# Patient Record
Sex: Female | Born: 1935 | Race: White | Hispanic: No | Marital: Married | Smoking: Never smoker
Health system: Southern US, Community
[De-identification: ages and names within clinical notes are randomized; demographics above are authoritative.]

## PROBLEM LIST (undated history)

## (undated) DIAGNOSIS — W19XXXA Unspecified fall, initial encounter: Secondary | ICD-10-CM

## (undated) DIAGNOSIS — I10 Essential (primary) hypertension: Secondary | ICD-10-CM

## (undated) DIAGNOSIS — M1990 Unspecified osteoarthritis, unspecified site: Secondary | ICD-10-CM

## (undated) DIAGNOSIS — F32A Depression, unspecified: Secondary | ICD-10-CM

## (undated) DIAGNOSIS — F329 Major depressive disorder, single episode, unspecified: Secondary | ICD-10-CM

## (undated) HISTORY — DX: Unspecified osteoarthritis, unspecified site: M19.90

## (undated) HISTORY — DX: Depression, unspecified: F32.A

## (undated) HISTORY — PX: TOTAL HIP ARTHROPLASTY: SHX124

## (undated) HISTORY — PX: APPENDECTOMY: SHX54

## (undated) HISTORY — DX: Major depressive disorder, single episode, unspecified: F32.9

## (undated) HISTORY — PX: ABDOMINAL HYSTERECTOMY: SHX81

## (undated) HISTORY — DX: Essential (primary) hypertension: I10

---

## 2004-09-25 ENCOUNTER — Ambulatory Visit: Payer: Self-pay | Admitting: Internal Medicine

## 2005-12-10 ENCOUNTER — Ambulatory Visit: Payer: Self-pay | Admitting: Internal Medicine

## 2006-08-10 ENCOUNTER — Emergency Department: Payer: Self-pay | Admitting: Internal Medicine

## 2006-12-17 ENCOUNTER — Ambulatory Visit: Payer: Self-pay | Admitting: Internal Medicine

## 2008-01-28 ENCOUNTER — Ambulatory Visit: Payer: Self-pay | Admitting: Internal Medicine

## 2009-01-30 ENCOUNTER — Ambulatory Visit: Payer: Self-pay | Admitting: Internal Medicine

## 2009-12-16 ENCOUNTER — Inpatient Hospital Stay: Payer: Self-pay | Admitting: Specialist

## 2009-12-20 ENCOUNTER — Encounter: Payer: Self-pay | Admitting: Internal Medicine

## 2010-01-08 ENCOUNTER — Emergency Department: Payer: Self-pay | Admitting: Internal Medicine

## 2010-01-15 ENCOUNTER — Encounter: Payer: Self-pay | Admitting: Internal Medicine

## 2014-04-04 DIAGNOSIS — I10 Essential (primary) hypertension: Secondary | ICD-10-CM | POA: Insufficient documentation

## 2015-01-02 DIAGNOSIS — E119 Type 2 diabetes mellitus without complications: Secondary | ICD-10-CM | POA: Insufficient documentation

## 2015-01-02 DIAGNOSIS — R7989 Other specified abnormal findings of blood chemistry: Secondary | ICD-10-CM | POA: Insufficient documentation

## 2015-01-27 DIAGNOSIS — M170 Bilateral primary osteoarthritis of knee: Secondary | ICD-10-CM | POA: Insufficient documentation

## 2015-08-28 ENCOUNTER — Ambulatory Visit (INDEPENDENT_AMBULATORY_CARE_PROVIDER_SITE_OTHER): Payer: Medicare Other | Admitting: Family Medicine

## 2015-08-28 ENCOUNTER — Encounter: Payer: Self-pay | Admitting: Family Medicine

## 2015-08-28 VITALS — BP 144/76 | HR 63 | Temp 98.6°F | Resp 16 | Ht 62.0 in | Wt 183.0 lb

## 2015-08-28 DIAGNOSIS — L850 Acquired ichthyosis: Secondary | ICD-10-CM | POA: Diagnosis not present

## 2015-08-28 DIAGNOSIS — M170 Bilateral primary osteoarthritis of knee: Secondary | ICD-10-CM

## 2015-08-28 DIAGNOSIS — E119 Type 2 diabetes mellitus without complications: Secondary | ICD-10-CM

## 2015-08-28 DIAGNOSIS — I10 Essential (primary) hypertension: Secondary | ICD-10-CM

## 2015-08-28 DIAGNOSIS — E559 Vitamin D deficiency, unspecified: Secondary | ICD-10-CM | POA: Diagnosis not present

## 2015-08-28 DIAGNOSIS — I420 Dilated cardiomyopathy: Secondary | ICD-10-CM | POA: Insufficient documentation

## 2015-08-28 DIAGNOSIS — R946 Abnormal results of thyroid function studies: Secondary | ICD-10-CM | POA: Diagnosis not present

## 2015-08-28 DIAGNOSIS — T148XXA Other injury of unspecified body region, initial encounter: Secondary | ICD-10-CM

## 2015-08-28 DIAGNOSIS — I429 Cardiomyopathy, unspecified: Secondary | ICD-10-CM

## 2015-08-28 DIAGNOSIS — F329 Major depressive disorder, single episode, unspecified: Secondary | ICD-10-CM

## 2015-08-28 DIAGNOSIS — F32A Depression, unspecified: Secondary | ICD-10-CM

## 2015-08-28 DIAGNOSIS — T148 Other injury of unspecified body region: Secondary | ICD-10-CM | POA: Diagnosis not present

## 2015-08-28 DIAGNOSIS — L853 Xerosis cutis: Secondary | ICD-10-CM

## 2015-08-28 MED ORDER — DICLOFENAC SODIUM 1 % TD GEL: 2.0000 g | Freq: Four times a day (QID) | TRANSDERMAL | Status: DC

## 2015-08-28 MED ORDER — CAMPHOR-MENTHOL 0.5-0.5 % EX LOTN: 1.0000 "application " | TOPICAL_LOTION | CUTANEOUS | Status: DC | PRN

## 2015-08-28 NOTE — Assessment & Plan Note (Signed)
Encouraged pt to take tramadol only PRN. Trial of diclofenac gel for pain. Continue tylenol 650 TID. Encouraged heat and massage. Consider ortho for joint injections. Consider PT for joint mobilization.

## 2015-08-28 NOTE — Assessment & Plan Note (Signed)
Borderline control. Pt is on multiple medications. Encouraged pt to check at home. Check CMET. Consider combo pills or change to amlodipine for better BP control. Recheck in 1 mos.

## 2015-08-28 NOTE — Progress Notes (Signed)
Subjective:    Patient ID: Carolyn Hernandez, female    DOB: [birth year], 82 y.o.   MRN: [id number]  HPI: Carolyn Hernandez is a 82 y.o. female presenting on 08/28/2015 for Establish Care   HPI  Pt presents to establish care today. Previous care provider was [facility]- Dr. Candiss Norse.  It has been 6 months since Her last PCP visit. Records from previous provider will be requested and reviewed. Current medical problems include:  Hypertension: Diagnosed > 15 years ago. Does not check BP at home. Currently taking nifedipine, metoprolol, lisinopril.  Leg swelling: On lasix for leg swelling. Legs have been swelling for a long time.  Arthritis: Both knees. Currently using a walker after hip fracture. Taking tramadol every morning. Does not feel it's helping. Has not had joint injections. Takes 650 tylenol three times daily.  Depression:  Diagnosis of cardiomyopathy on her chart- cath report and normal echo from [facility]. Has not seen cardiology in quite some time. Sore on her leg: R leg.  Trouble healing. Has itching. Rash around the ankles. Multiple skin tears. She is scratching constantly at night. No change in soaps or detergeants. No animals in the house.  Diabetes: Diet controlled. Diagnosed 20 years ago. Last A1c was 6.0%. Has not had eye exam recently- 2 years most recent. No retinopathy noted.  Depression: Diagnosed when husband died. Feels alone. PHQ-9 20/27  Health maintenance:  Deferred due to wound care.      Past Medical History  Diagnosis Date  . Arthritis   . Depression   . Hypertension    Past Surgical History  Procedure Laterality Date  . Appendectomy    . Abdominal hysterectomy    . Total hip arthroplasty      Social History   Social History  . Marital Status: Married    Spouse Name: N/A  . Number of Children: N/A  . Years of Education: N/A   Occupational History  . Not on file.   Social History Main Topics  . Smoking status: Never Smoker   .  Smokeless tobacco: Not on file  . Alcohol Use: No  . Drug Use: No  . Sexual Activity: Not on file   Other Topics Concern  . Not on file   Social History Narrative  . No narrative on file   Family History  Problem Relation Age of Onset  . Diabetes Daughter   . Heart disease Son   . Liver disease Son   . Alcohol abuse Son    No current outpatient prescriptions on file prior to visit.   No current facility-administered medications on file prior to visit.    Review of Systems  Constitutional: Negative for fever and chills.  HENT: Negative.   Respiratory: Negative for cough, chest tightness and wheezing.   Cardiovascular: Positive for leg swelling. Negative for chest pain.  Gastrointestinal: Negative for nausea, vomiting, abdominal pain, diarrhea and constipation.  Endocrine: Negative.  Negative for cold intolerance, heat intolerance, polydipsia, polyphagia and polyuria.  Genitourinary: Negative for dysuria and difficulty urinating.  Musculoskeletal: Positive for arthralgias. Gait problem: uses walker to ambulate since hip fracture   Neurological: Negative for dizziness, light-headedness and numbness.  Psychiatric/Behavioral: Positive for dysphoric mood. Negative for suicidal ideas and sleep disturbance.   Per HPI unless specifically indicated above     Objective:    BP 144/76 mmHg  Pulse 63  Temp(Src) 98.6 F (37 C) (Oral)  Resp 16  Ht 5\' 2"  (1.575 m)  Wt 183  lb (83.008 kg)  BMI 33.46 kg/m2  Wt Readings from Last 3 Encounters:  08/28/15 183 lb (83.008 kg)    Physical Exam  Constitutional: She is oriented to person, place, and time. She appears well-developed and well-nourished.  HENT:  Head: Normocephalic and atraumatic.  Neck: Neck supple.  Cardiovascular: Normal rate, regular rhythm and normal heart sounds.  Exam reveals no gallop and no friction rub.   No murmur heard. Pulmonary/Chest: Effort normal and breath sounds normal. She has no wheezes. She exhibits no  tenderness.  Abdominal: Soft. Normal appearance and bowel sounds are normal. She exhibits no distension and no mass. There is no tenderness. There is no rebound and no guarding.  Musculoskeletal: She exhibits no tenderness.       Right knee: She exhibits decreased range of motion.       Left knee: She exhibits decreased range of motion. She exhibits no swelling, no effusion and no ecchymosis.       Right lower leg: She exhibits edema (+1 pitting edema).       Left lower leg: She exhibits edema (+1 pitting edema).  ROM limited in knees due to pain. Crepitus with movement R knee.    Lymphadenopathy:    She has no cervical adenopathy.  Neurological: She is alert and oriented to person, place, and time.  Skin: Skin is warm and dry. Rash noted. Rash is macular.      No results found for this or any previous visit.    Assessment & Plan:   Problem List Items Addressed This Visit      Cardiovascular and Mediastinum   Essential (primary) hypertension    Borderline control. Pt is on multiple medications. Encouraged pt to check at home. Check CMET. Consider combo pills or change to amlodipine for better BP control. Recheck in 1 mos.       Relevant Medications   furosemide (LASIX) 20 MG tablet   lisinopril (PRINIVIL,ZESTRIL) 40 MG tablet   metoprolol (LOPRESSOR) 50 MG tablet   NIFEdipine (ADALAT CC) 90 MG 24 hr tablet   aspirin 81 MG tablet   Other Relevant Orders   Comprehensive metabolic panel   Cardiomyopathy [facility])    Per Care Everywhere. No recent EF. Consider echo or cardiology referral given concurrent leg swelling. Check CMET.       Relevant Medications   furosemide (LASIX) 20 MG tablet   lisinopril (PRINIVIL,ZESTRIL) 40 MG tablet   metoprolol (LOPRESSOR) 50 MG tablet   NIFEdipine (ADALAT CC) 90 MG 24 hr tablet   aspirin 81 MG tablet     Endocrine   Controlled type 2 diabetes mellitus without complication (HCC) - Primary    Last A1c 6.0%. Diet controlled. Check HgA1c  today.       Relevant Medications   lisinopril (PRINIVIL,ZESTRIL) 40 MG tablet   aspirin 81 MG tablet   Other Relevant Orders   Lipid panel   Hemoglobin A1c     Other   Clinical depression    Moderate depressive disorder. Check TSH, Vitamin D. Consider increasing citalopram and further visit. Counseling vs. Medication adjustment.       Relevant Medications   citalopram (CELEXA) 10 MG tablet   Other Relevant Orders   VITAMIN D 25 Hydroxy (Vit-D Deficiency, Fractures)   Abnormal results of thyroid function studies    TSH low per careeverywhere. Recheck today. Consider endocrine referral for subclinical hypothyroid.       Relevant Orders   TSH   Primary osteoarthritis of both  knees    Encouraged pt to take tramadol only PRN. Trial of diclofenac gel for pain. Continue tylenol 650 TID. Encouraged heat and massage. Consider ortho for joint injections. Consider PT for joint mobilization.       Relevant Medications   diclofenac sodium (VOLTAREN) 1 % GEL    Other Visit Diagnoses    Vitamin D deficiency        check Vitamin D levels.     Relevant Orders    VITAMIN D 25 Hydroxy (Vit-D Deficiency, Fractures)    Multiple skin tears        Treated with vaseline gause and telfa in the office. Wounds cleaned. Consistent with scratching. Pt encouraged to wear gloves/socks at night not to scratch. Kee    Dry skin dermatitis        Apply Sarna lotion or other moisturizing lotion. Avoid scratching.        Meds ordered this encounter  Medications  . citalopram (CELEXA) 10 MG tablet    Sig: Take 10 mg by mouth daily.  . furosemide (LASIX) 20 MG tablet    Sig: Take 20 mg by mouth daily.  Marland Kitchen levocetirizine (XYZAL) 5 MG tablet    Sig: Take 5 mg by mouth every evening.  Marland Kitchen lisinopril (PRINIVIL,ZESTRIL) 40 MG tablet    Sig: Take 40 mg by mouth daily.  . traMADol (ULTRAM) 50 MG tablet    Sig: Take by mouth every 8 (eight) hours as needed.   . metoprolol (LOPRESSOR) 50 MG tablet    Sig: Take  50 mg by mouth 2 (two) times daily. Pt takes 1 1/2 tab twice daily  . NIFEdipine (ADALAT CC) 90 MG 24 hr tablet    Sig: Take 90 mg by mouth daily.  . potassium chloride SA (K-DUR,KLOR-CON) 20 MEQ tablet    Sig: Take 20 mEq by mouth daily.  Marland Kitchen aspirin 81 MG tablet    Sig: Take 81 mg by mouth daily.  . ferrous sulfate 325 (65 FE) MG tablet    Sig: Take 325 mg by mouth daily with breakfast.  . Cholecalciferol (VITAMIN D) 2000 units tablet    Sig: Take 2,000 Units by mouth daily.  . diclofenac sodium (VOLTAREN) 1 % GEL    Sig: Apply 2 g topically 4 (four) times daily.    Dispense:  100 g    Refill:  11    Order Specific Question:  Supervising Provider    Answer:  Arlis Porta [id number]  . camphor-menthol (SARNA) lotion    Sig: Apply 1 application topically as needed for itching.    Dispense:  222 mL    Refill:  0    Order Specific Question:  Supervising Provider    Answer:  Arlis Porta [id number]      Follow up plan: Return in about 4 weeks (around 09/25/2015) for arthritis. BP.

## 2015-08-28 NOTE — Assessment & Plan Note (Signed)
Last A1c 6.0%. Diet controlled. Check HgA1c today.

## 2015-08-28 NOTE — Patient Instructions (Signed)
Arthritis: Try the voltaren gel up to 4 times daily as needed for pain. I would apply at least twice daily. You can also use capsaicin cream over the counter- this is cream made from jalapeno peppers to help with pain. Apply a small amount twice daily for pain. Use tramadol sparingly. I would try to avoid this medication.  We can refer you to orthopedics for joint injections if needed.   Leg swelling: Get compression socks. Wear them daily when active. Take them off at bedtime. Drink plenty of water and elevate your feet.  We will get some labwork to check in on your kidney and diabetes.   Keep your wounds covers and do not scratch. Get Sarna lotion or gentle moisturizing lotion for dry skin.

## 2015-08-28 NOTE — Assessment & Plan Note (Signed)
Per Care Everywhere. No recent EF. Consider echo or cardiology referral given concurrent leg swelling. Check CMET.

## 2015-08-28 NOTE — Assessment & Plan Note (Signed)
TSH low per careeverywhere. Recheck today. Consider endocrine referral for subclinical hypothyroid.

## 2015-08-28 NOTE — Assessment & Plan Note (Signed)
Moderate depressive disorder. Check TSH, Vitamin D. Consider increasing citalopram and further visit. Counseling vs. Medication adjustment.

## 2015-08-29 ENCOUNTER — Telehealth: Payer: Self-pay | Admitting: Family Medicine

## 2015-08-29 NOTE — Telephone Encounter (Signed)
Proceed. She has failed tylenol, tramadol. She has cardiac history so oral NSAIDs are consider unsafe. Thanks! AK

## 2015-08-29 NOTE — Telephone Encounter (Signed)
Received PA request for Diclofenac. Do you want to proceed or change medication?

## 2015-08-30 NOTE — Telephone Encounter (Signed)
PA pending approval.[place]

## 2015-09-11 DIAGNOSIS — R946 Abnormal results of thyroid function studies: Secondary | ICD-10-CM | POA: Diagnosis not present

## 2015-09-11 DIAGNOSIS — E559 Vitamin D deficiency, unspecified: Secondary | ICD-10-CM | POA: Diagnosis not present

## 2015-09-11 DIAGNOSIS — I10 Essential (primary) hypertension: Secondary | ICD-10-CM | POA: Diagnosis not present

## 2015-09-11 DIAGNOSIS — E119 Type 2 diabetes mellitus without complications: Secondary | ICD-10-CM | POA: Diagnosis not present

## 2015-09-12 ENCOUNTER — Telehealth: Payer: Self-pay | Admitting: Family Medicine

## 2015-09-12 DIAGNOSIS — R7989 Other specified abnormal findings of blood chemistry: Secondary | ICD-10-CM

## 2015-09-12 LAB — LIPID PANEL
CHOLESTEROL TOTAL: 197 mg/dL (ref 100–199)
Chol/HDL Ratio: 4.6 ratio units — ABNORMAL HIGH (ref 0.0–4.4)
HDL: 43 mg/dL (ref >39)
LDL CALC: 121 mg/dL — AB (ref 0–99)
TRIGLYCERIDES: 164 mg/dL — AB (ref 0–149)
VLDL CHOLESTEROL CAL: 33 mg/dL (ref 5–40)

## 2015-09-12 LAB — HEMOGLOBIN A1C
Est. average glucose Bld gHb Est-mCnc: 131 mg/dL
HEMOGLOBIN A1C: 6.2 % — AB (ref 4.8–5.6)

## 2015-09-12 LAB — COMPREHENSIVE METABOLIC PANEL
A/G RATIO: 1.6 (ref 1.2–2.2)
ALT: 19 IU/L (ref 0–32)
AST: 22 IU/L (ref 0–40)
Albumin: 3.9 g/dL (ref 3.5–4.7)
Alkaline Phosphatase: 79 IU/L (ref 39–117)
BUN/Creatinine Ratio: 22 (ref 11–26)
BUN: 21 mg/dL (ref 8–27)
CHLORIDE: 107 mmol/L — AB (ref 96–106)
CO2: 23 mmol/L (ref 18–29)
Calcium: 9.5 mg/dL (ref 8.7–10.3)
Creatinine, Ser: 0.96 mg/dL (ref 0.57–1.00)
GFR calc non Af Amer: 55 mL/min/{1.73_m2} — ABNORMAL LOW (ref >59)
GFR, EST AFRICAN AMERICAN: 63 mL/min/{1.73_m2} (ref >59)
Globulin, Total: 2.5 g/dL (ref 1.5–4.5)
Glucose: 100 mg/dL — ABNORMAL HIGH (ref 65–99)
POTASSIUM: 4.4 mmol/L (ref 3.5–5.2)
SODIUM: 146 mmol/L — AB (ref 134–144)
Total Protein: 6.4 g/dL (ref 6.0–8.5)

## 2015-09-12 LAB — TSH: TSH: 0.018 u[IU]/mL — AB (ref 0.450–4.500)

## 2015-09-12 LAB — VITAMIN D 25 HYDROXY (VIT D DEFICIENCY, FRACTURES): Vit D, 25-Hydroxy: 22.2 ng/mL — ABNORMAL LOW (ref 30.0–100.0)

## 2015-09-12 NOTE — Telephone Encounter (Signed)
Called [facility] to add on labs.

## 2015-09-13 ENCOUNTER — Other Ambulatory Visit: Payer: Self-pay | Admitting: Family Medicine

## 2015-09-13 DIAGNOSIS — E559 Vitamin D deficiency, unspecified: Secondary | ICD-10-CM

## 2015-09-13 MED ORDER — VITAMIN D (ERGOCALCIFEROL) 1.25 MG (50000 UNIT) PO CAPS: 50000.0000 [IU] | ORAL_CAPSULE | ORAL | Status: DC

## 2015-09-14 LAB — SPECIMEN STATUS REPORT

## 2015-09-14 LAB — T4, FREE: Free T4: 0.89 ng/dL (ref 0.82–1.77)

## 2015-09-14 LAB — T3 UPTAKE
Free Thyroxine Index: 1.3 (ref 1.2–4.9)
T3 UPTAKE RATIO: 27 % (ref 24–39)

## 2015-09-14 LAB — T4: T4, Total: 4.9 ug/dL (ref 4.5–12.0)

## 2015-09-14 LAB — T3, FREE: T3, Free: 2.9 pg/mL (ref 2.0–4.4)

## 2015-10-09 ENCOUNTER — Encounter: Payer: Self-pay | Admitting: Family Medicine

## 2015-10-09 ENCOUNTER — Ambulatory Visit (INDEPENDENT_AMBULATORY_CARE_PROVIDER_SITE_OTHER): Payer: Medicare Other | Admitting: Family Medicine

## 2015-10-09 VITALS — BP 137/71 | HR 65 | Temp 97.9°F | Resp 16 | Ht 62.0 in | Wt 185.0 lb

## 2015-10-09 DIAGNOSIS — Z7409 Other reduced mobility: Secondary | ICD-10-CM | POA: Diagnosis not present

## 2015-10-09 DIAGNOSIS — R296 Repeated falls: Secondary | ICD-10-CM | POA: Diagnosis not present

## 2015-10-09 DIAGNOSIS — M170 Bilateral primary osteoarthritis of knee: Secondary | ICD-10-CM

## 2015-10-09 DIAGNOSIS — F329 Major depressive disorder, single episode, unspecified: Secondary | ICD-10-CM

## 2015-10-09 DIAGNOSIS — F32A Depression, unspecified: Secondary | ICD-10-CM

## 2015-10-09 MED ORDER — CITALOPRAM HYDROBROMIDE 20 MG PO TABS: 20.0000 mg | ORAL_TABLET | Freq: Every day | ORAL | Status: DC

## 2015-10-09 NOTE — Assessment & Plan Note (Addendum)
Increase celexa to 20mg  daily. Believe patient would benefit from social interaction- she has refused to join senior center or talk with counselor per her daughters.  Encouraged pt to leave the house more often. Make arrangements with family to have regular outings.  Recheck 1 mos.

## 2015-10-09 NOTE — Patient Instructions (Signed)
Depression: Increase your celexa to 20mg  today. Take 2 pills at bedtime until the bottle is empty and pick up new prescription.  Hopefully this will give you more energy.   We will see if I can get PT out to your house to help with gait training and leg strengthening. Pleae let me know if you would like to see an orthopedist about your knees.

## 2015-10-09 NOTE — Assessment & Plan Note (Signed)
Pt currently declining a referral to orthopedics to be evaluated for joint injections. Continue volataren gel. Have asked for home health Pt to see her to evaluate gait and work on safety with assistive device. Goal to reduce falls and strengthen legs.

## 2015-10-09 NOTE — Progress Notes (Signed)
Subjective:    Patient ID: Carolyn Hernandez, female    DOB: Mar 05, 1930, 82 y.o.   MRN: [id number]  HPI: Carolyn Hernandez is a 82 y.o. female presenting on 10/09/2015 for Hypertension   HPI  Pt presents for hypertension follow-up. BP is controlled today. No chest pain. No shortness of breath. No visual changes.  Arthritis- doing volataren gel for pain. Mild relief. She has difficulty walking and ambulates with a walker. Has never done physical therapy. Has has multiple falls due to difficulty ambulating due to her knees. She lives alone and when she falls cannot get back up. Pt is reluctant to go to orthopedist for joint injections. Pt has been referred to PT in the past but could not go due to vision.  Sleep issues- reports being up and down. No creepy crawly sensation in legs- thinks it is pain keeping her awake. Daughter reports she sleeps a lot during the day. Pt reports she has trouble turning off her thoughts. Depression and anxiety: Feels like her anxiety is worse. Is worrying. Is having complicated grief due to death of husband and son. Pain keeps her home all day- she has difficulty getting out to the house due to knees and poor vision. She sits in the house and thinks all day and worries. She is taking 10mg  celexa- daughter's are thinking she may need a higher dose.     Past Medical History  Diagnosis Date  . Arthritis   . Depression   . Hypertension     Current Outpatient Prescriptions on File Prior to Visit  Medication Sig  . aspirin 81 MG tablet Take 81 mg by mouth daily.  . camphor-menthol (SARNA) lotion Apply 1 application topically as needed for itching.  . diclofenac sodium (VOLTAREN) 1 % GEL Apply 2 g topically 4 (four) times daily.  . furosemide (LASIX) 20 MG tablet Take 20 mg by mouth daily.  Marland Kitchen lisinopril (PRINIVIL,ZESTRIL) 40 MG tablet Take 40 mg by mouth daily.  . metoprolol (LOPRESSOR) 50 MG tablet Take 50 mg by mouth 2 (two) times daily. Pt takes 1 1/2 tab  twice daily  . NIFEdipine (ADALAT CC) 90 MG 24 hr tablet Take 90 mg by mouth daily.  . Vitamin D, Ergocalciferol, (DRISDOL) 50000 units CAPS capsule Take 1 capsule (50,000 Units total) by mouth every 7 (seven) days.  . potassium chloride SA (K-DUR,KLOR-CON) 20 MEQ tablet Take 20 mEq by mouth daily.   No current facility-administered medications on file prior to visit.    Review of Systems  Constitutional: Negative for fever and chills.  HENT: Negative.   Respiratory: Negative for cough, chest tightness and wheezing.   Cardiovascular: Negative for chest pain and leg swelling.  Gastrointestinal: Negative for nausea, vomiting, abdominal pain, diarrhea and constipation.  Endocrine: Negative.  Negative for cold intolerance, heat intolerance, polydipsia, polyphagia and polyuria.  Genitourinary: Negative for dysuria and difficulty urinating.  Musculoskeletal: Positive for joint swelling, arthralgias and gait problem.  Neurological: Negative for dizziness, light-headedness and numbness.  Psychiatric/Behavioral: Positive for sleep disturbance and dysphoric mood. Negative for suicidal ideas. The patient is nervous/anxious.    Per HPI unless specifically indicated above     Objective:    BP 137/71 mmHg  Pulse 65  Temp(Src) 97.9 F (36.6 C) (Oral)  Resp 16  Ht 5\' 2"  (1.575 m)  Wt 185 lb (83.915 kg)  BMI 33.83 kg/m2  Wt Readings from Last 3 Encounters:  10/09/15 185 lb (83.915 kg)  08/28/15 183 lb (83.008  kg)    GAD 7 : Generalized Anxiety Score 10/09/2015  Nervous, Anxious, on Edge 1  Control/stop worrying 0  Worry too much - different things 1  Trouble relaxing 0  Restless 0  Easily annoyed or irritable 1  Afraid - awful might happen 0  Total GAD 7 Score 3  Anxiety Difficulty Not difficult at all    Depression screen [facility] 2/9 10/09/2015 08/28/2015  Decreased Interest 1 3  Down, Depressed, Hopeless 1 2  PHQ - 2 Score 2 5  Altered sleeping 3 3  Tired, decreased energy 3 3    Change in appetite 0 3  Feeling bad or failure about yourself  0 0  Trouble concentrating 0 3  Moving slowly or fidgety/restless 0 0  Suicidal thoughts 0 0  PHQ-9 Score 8 17     Physical Exam  Constitutional: She is oriented to person, place, and time. She appears well-developed and well-nourished.  HENT:  Head: Normocephalic and atraumatic.  Neck: Neck supple.  Cardiovascular: Normal rate, regular rhythm and normal heart sounds.  Exam reveals no gallop and no friction rub.   No murmur heard. Pulmonary/Chest: Effort normal and breath sounds normal. She has no wheezes. She exhibits no tenderness.  Abdominal: Soft. Normal appearance and bowel sounds are normal. She exhibits no distension and no mass. There is no tenderness. There is no rebound and no guarding.  Musculoskeletal: She exhibits no edema.       Right knee: She exhibits decreased range of motion and swelling. Tenderness found. Medial joint line tenderness noted.       Left knee: She exhibits decreased range of motion. Tenderness found. Medial joint line tenderness noted.  Lymphadenopathy:    She has no cervical adenopathy.  Neurological: She is alert and oriented to person, place, and time.  Skin: Skin is warm and dry.  Psychiatric: Her speech is normal and behavior is normal. Judgment and thought content normal. Cognition and memory are normal. She exhibits a depressed mood.   Results for orders placed or performed in visit on 08/28/15  TSH  Result Value Ref Range   TSH 0.018 (L) 0.450 - 4.500 uIU/mL  Comprehensive metabolic panel  Result Value Ref Range   Glucose 100 (H) 65 - 99 mg/dL   BUN 21 8 - 27 mg/dL   Creatinine, Ser 0.96 0.57 - 1.00 mg/dL   GFR calc non Af Amer 55 (L) >59 mL/min/1.73   GFR calc Af Amer 63 >59 mL/min/1.73   BUN/Creatinine Ratio 22 11 - 26   Sodium 146 (H) 134 - 144 mmol/L   Potassium 4.4 3.5 - 5.2 mmol/L   Chloride 107 (H) 96 - 106 mmol/L   CO2 23 18 - 29 mmol/L   Calcium 9.5 8.7 - 10.3  mg/dL   Total Protein 6.4 6.0 - 8.5 g/dL   Albumin 3.9 3.5 - 4.7 g/dL   Globulin, Total 2.5 1.5 - 4.5 g/dL   Albumin/Globulin Ratio 1.6 1.2 - 2.2   Bilirubin Total <0.2 0.0 - 1.2 mg/dL   Alkaline Phosphatase 79 39 - 117 IU/L   AST 22 0 - 40 IU/L   ALT 19 0 - 32 IU/L  VITAMIN D 25 Hydroxy (Vit-D Deficiency, Fractures)  Result Value Ref Range   Vit D, 25-Hydroxy 22.2 (L) 30.0 - 100.0 ng/mL  Lipid panel  Result Value Ref Range   Cholesterol, Total 197 100 - 199 mg/dL   Triglycerides 164 (H) 0 - 149 mg/dL   HDL 43 >39  mg/dL   VLDL Cholesterol Cal 33 5 - 40 mg/dL   LDL Calculated 121 (H) 0 - 99 mg/dL   Chol/HDL Ratio 4.6 (H) 0.0 - 4.4 ratio units  Hemoglobin A1c  Result Value Ref Range   Hgb A1c MFr Bld 6.2 (H) 4.8 - 5.6 %   Est. average glucose Bld gHb Est-mCnc 131 mg/dL  T4, free  Result Value Ref Range   Free T4 0.89 0.82 - 1.77 ng/dL  T4  Result Value Ref Range   T4, Total 4.9 4.5 - 12.0 ug/dL  T3 uptake  Result Value Ref Range   T3 Uptake Ratio 27 24 - 39 %   Free Thyroxine Index 1.3 1.2 - 4.9  T3, free  Result Value Ref Range   T3, Free 2.9 2.0 - 4.4 pg/mL  Specimen status report  Result Value Ref Range   specimen status report Comment       Assessment & Plan:   Problem List Items Addressed This Visit      Other   Clinical depression - Primary    Increase celexa to 20mg  daily. Believe patient would benefit from social interaction- she has refused to join senior center or talk with counselor per her daughters.  Encouraged pt to leave the house more often. Make arrangements with family to have regular outings.  Recheck 1 mos.       Relevant Medications   citalopram (CELEXA) 20 MG tablet   Primary osteoarthritis of both knees    Pt currently declining a referral to orthopedics to be evaluated for joint injections. Continue volataren gel. Have asked for home health Pt to see her to evaluate gait and work on safety with assistive device. Goal to reduce falls and  strengthen legs.       Relevant Orders   Ambulatory referral to [facility]    Other Visit Diagnoses    Recurrent falls        Home health PT consult for safety and to increase mobility.     Relevant Orders    Ambulatory referral to Home Health    Impaired mobility        Due to difficulty walking from osteoarthritis of the knees. Have home health PT evaluate due to patient inability to get out of house for PT.     Relevant Orders    Ambulatory referral to [facility] ordered this encounter  Medications  . citalopram (CELEXA) 20 MG tablet    Sig: Take 1 tablet (20 mg total) by mouth daily.    Dispense:  30 tablet    Refill:  3    Order Specific Question:  Supervising Provider    Answer:  Arlis Porta [id number]      Follow up plan: Return in about 1 month (around 11/08/2015) for depression. Marland Kitchen

## 2015-10-12 ENCOUNTER — Other Ambulatory Visit: Payer: Self-pay | Admitting: Family Medicine

## 2015-10-13 ENCOUNTER — Telehealth: Payer: Self-pay | Admitting: Family Medicine

## 2015-10-13 NOTE — Telephone Encounter (Signed)
Called to confirm pt did not home health PT.

## 2015-10-13 NOTE — Telephone Encounter (Signed)
-----   Message from Devona Konig, [place] sent at 10/13/2015  1:33 PM EDT ----- Regarding: FW: HH PT order    ----- Message -----    From: Leeroy Bock    Sent: 10/13/2015   1:25 PM      To: Devona Konig, CMA Subject: RE: [facility] PT order                                Actually, The patient declined therapy.  Said something about a rod in her leg and didn't want PT>  Just let me know how to proceed when you talk to MD> Thanks! Happy Friday!  ----- Message -----    From: Devona Konig, CMA    Sent: 10/13/2015  10:24 AM      To: Kayren Eaves Ozimek Subject: RE: HH PT order                                Hi I did and we will get Dr. Luan Pulling to [place] but he is not in the office until Monday.  Thanks. ----- Message -----    From: Leeroy Bock    Sent: 10/13/2015   9:55 AM      To: Devona Konig, CMA Subject: HH PT order                                    Good Morning! I just wanted to ensure you got my message about needing an MD to sign the [facility] rx submitted. Thanks! Stanton Kidney

## 2015-10-26 ENCOUNTER — Other Ambulatory Visit: Payer: Self-pay | Admitting: Family Medicine

## 2015-11-06 ENCOUNTER — Ambulatory Visit: Payer: Medicare Other | Admitting: Family Medicine

## 2015-11-20 ENCOUNTER — Ambulatory Visit (INDEPENDENT_AMBULATORY_CARE_PROVIDER_SITE_OTHER): Payer: Medicare Other | Admitting: Family Medicine

## 2015-11-20 VITALS — BP 140/60 | HR 74 | Temp 98.4°F | Resp 16 | Ht 62.0 in | Wt 185.0 lb

## 2015-11-20 DIAGNOSIS — I10 Essential (primary) hypertension: Secondary | ICD-10-CM

## 2015-11-20 DIAGNOSIS — F32A Depression, unspecified: Secondary | ICD-10-CM

## 2015-11-20 DIAGNOSIS — R600 Localized edema: Secondary | ICD-10-CM

## 2015-11-20 DIAGNOSIS — E119 Type 2 diabetes mellitus without complications: Secondary | ICD-10-CM

## 2015-11-20 DIAGNOSIS — F329 Major depressive disorder, single episode, unspecified: Secondary | ICD-10-CM | POA: Diagnosis not present

## 2015-11-20 DIAGNOSIS — M170 Bilateral primary osteoarthritis of knee: Secondary | ICD-10-CM

## 2015-11-20 LAB — POCT UA - MICROALBUMIN
Albumin/Creatinine Ratio, Urine, POC: 0
Creatinine, POC: 0 mg/dL
MICROALBUMIN (UR) POC: 100 mg/L

## 2015-11-20 MED ORDER — FUROSEMIDE 40 MG PO TABS: 40.0000 mg | ORAL_TABLET | Freq: Every day | ORAL | Status: DC

## 2015-11-20 NOTE — Assessment & Plan Note (Signed)
Improved with voltaren gel PRN for knees. Pt continues to decline PT for help with strength and pain management.

## 2015-11-20 NOTE — Patient Instructions (Signed)
Your depression seems to be doing a lot better.   Please schedule an appt to get an eye exam.  Please wear your compression stockings. Take 40mg  of lasix daily. Please elevate your feet when sitting down. IF you noticed redness, calf pain, or other concerning symptoms- please go to the ER.

## 2015-11-20 NOTE — Progress Notes (Addendum)
Subjective:    Patient ID: Carolyn Hernandez, female    DOB: Apr 29, 1930, 82 y.o.   MRN: [id number]  HPI: Carolyn Hernandez is a 82 y.o. female presenting on 11/20/2015 for Depression   HPI  Overall feeling well at home. Sleeping better. She feels over all well. No side effects to increased dose of celexa. No SI or HI.  Knees are still painful but improved with volatren. She does not want PT at this time.  DM: due for follow-up. Last eye exam 2.5 years ago. Previously saw eye doctor but he has since passed away.   Leg swelling: L>R takes lasix. Some improvement. Had hip trauma and many rods/pins. L has always swollen more. Does not wear compression stockings. No redness to the leg. No pain. Occasionally elevates feet.   Past Medical History  Diagnosis Date  . Arthritis   . Depression   . Hypertension     Current Outpatient Prescriptions on File Prior to Visit  Medication Sig  . aspirin 81 MG tablet Take 81 mg by mouth daily.  . camphor-menthol (SARNA) lotion Apply 1 application topically as needed for itching.  . citalopram (CELEXA) 20 MG tablet Take 1 tablet (20 mg total) by mouth daily.  . diclofenac sodium (VOLTAREN) 1 % GEL Apply 2 g topically 4 (four) times daily.  Marland Kitchen lisinopril (PRINIVIL,ZESTRIL) 40 MG tablet TAKE ONE (1) TABLET BY MOUTH EVERY DAY  . metoprolol (LOPRESSOR) 50 MG tablet Take 50 mg by mouth 2 (two) times daily. Pt takes 1 1/2 tab twice daily  . NIFEdipine (ADALAT CC) 90 MG 24 hr tablet Take 90 mg by mouth daily.  . potassium chloride SA (K-DUR,KLOR-CON) 20 MEQ tablet Take 20 mEq by mouth daily.  . Vitamin D, Ergocalciferol, (DRISDOL) 50000 units CAPS capsule Take 1 capsule (50,000 Units total) by mouth every 7 (seven) days.   No current facility-administered medications on file prior to visit.    Review of Systems  Constitutional: Negative for fever and chills.  HENT: Negative.   Respiratory: Negative for cough, chest tightness and wheezing.     Cardiovascular: Positive for leg swelling. Negative for chest pain.  Gastrointestinal: Negative for nausea, vomiting, abdominal pain, diarrhea and constipation.  Endocrine: Negative.  Negative for cold intolerance, heat intolerance, polydipsia, polyphagia and polyuria.  Genitourinary: Negative for dysuria and difficulty urinating.  Musculoskeletal: Negative.   Neurological: Negative for dizziness, light-headedness and numbness.  Psychiatric/Behavioral: Negative.    Per HPI unless specifically indicated above Depression screen [facility] 2/9 11/20/2015 10/09/2015 08/28/2015  Decreased Interest 0 1 3  Down, Depressed, Hopeless 1 1 2   PHQ - 2 Score 1 2 5   Altered sleeping 1 3 3   Tired, decreased energy 3 3 3   Change in appetite - 0 3  Feeling bad or failure about yourself  0 0 0  Trouble concentrating 0 0 3  Moving slowly or fidgety/restless 0 0 0  Suicidal thoughts 0 0 0  PHQ-9 Score 5 8 17         Objective:    BP 140/60 mmHg  Pulse 74  Temp(Src) 98.4 F (36.9 C) (Oral)  Resp 16  Ht 5\' 2"  (1.575 m)  Wt 185 lb (83.915 kg)  BMI 33.83 kg/m2  Wt Readings from Last 3 Encounters:  11/20/15 185 lb (83.915 kg)  10/09/15 185 lb (83.915 kg)  08/28/15 183 lb (83.008 kg)    Physical Exam  Constitutional: She is oriented to person, place, and time. She appears well-developed and well-nourished.  HENT:  Head: Normocephalic and atraumatic.  Neck: Neck supple.  Cardiovascular: Normal rate, regular rhythm and normal heart sounds.  Exam reveals no gallop and no friction rub.   No murmur heard. Pulmonary/Chest: Effort normal and breath sounds normal. She has no wheezes. She exhibits no tenderness.  Abdominal: Soft. Normal appearance and bowel sounds are normal. She exhibits no distension and no mass. There is no tenderness. There is no rebound and no guarding.  Musculoskeletal: Normal range of motion. She exhibits no tenderness.       Right lower leg: She exhibits edema (+1 pitting).       Left  lower leg: She exhibits edema (+1-2 pitting).  Calf circumference L- 44cm/ R 43cm. Negative homan's sign.   Lymphadenopathy:    She has no cervical adenopathy.  Neurological: She is alert and oriented to person, place, and time.  Skin: Skin is warm and dry.   Results for orders placed or performed in visit on 11/20/15  POCT UA - Microalbumin  Result Value Ref Range   Microalbumin Ur, POC 100 mg/L   Creatinine, POC 0 mg/dL   Albumin/Creatinine Ratio, Urine, POC 0       Assessment & Plan:   Problem List Items Addressed This Visit      Cardiovascular and Mediastinum   Essential (primary) hypertension    Controlled. Continue current regimen. Check CMET.       Relevant Medications   furosemide (LASIX) 40 MG tablet     Endocrine   Controlled type 2 diabetes mellitus without complication ([place]) - Primary   Relevant Orders   POCT UA - Microalbumin (Completed)     Other   Clinical depression    Improved with increased celexa. Recheck 1 mos.       Primary osteoarthritis of both knees    Improved with voltaren gel PRN for knees. Pt continues to decline PT for help with strength and pain management.       Pedal edema    L>R. Discussed [place] to r/o blood clot- pt declined. Alarm symptoms reviewed. Increase lasix to 40 mg. Recheck CMET next visit.Encouraged compression stockings.       Relevant Medications   furosemide (LASIX) 40 MG tablet      Meds ordered this encounter  Medications  . DISCONTD: furosemide (LASIX) 40 MG tablet    Sig: Take 1 tablet (40 mg total) by mouth daily.    Dispense:  30 tablet    Refill:  11    Order Specific Question:  Supervising Provider    Answer:  Arlis Porta [phone]  . furosemide (LASIX) 40 MG tablet    Sig: Take 1 tablet (40 mg total) by mouth daily.    Dispense:  90 tablet    Refill:  3    Order Specific Question:  Supervising Provider    Answer:  Arlis Porta [id number]      Follow up plan: Return in about 4 weeks  (around 12/18/2015) for Diabetes. Marland Kitchen

## 2015-11-20 NOTE — Assessment & Plan Note (Signed)
Improved with increased celexa. Recheck 1 mos.

## 2015-11-20 NOTE — Assessment & Plan Note (Signed)
Controlled. Continue current regimen. Check CMET.

## 2015-11-20 NOTE — Assessment & Plan Note (Signed)
L>R. Discussed [place] to r/o blood clot- pt declined. Alarm symptoms reviewed. Increase lasix to 40 mg. Recheck CMET next visit.Encouraged compression stockings.

## 2015-12-18 ENCOUNTER — Other Ambulatory Visit: Payer: Self-pay | Admitting: Family Medicine

## 2015-12-18 ENCOUNTER — Other Ambulatory Visit: Payer: Medicare Other

## 2015-12-18 ENCOUNTER — Other Ambulatory Visit: Payer: Self-pay | Admitting: *Deleted

## 2015-12-18 DIAGNOSIS — R7989 Other specified abnormal findings of blood chemistry: Secondary | ICD-10-CM

## 2015-12-18 DIAGNOSIS — R946 Abnormal results of thyroid function studies: Secondary | ICD-10-CM | POA: Diagnosis not present

## 2015-12-19 LAB — T3: T3 TOTAL: 113 ng/dL (ref 76–181)

## 2015-12-19 LAB — T3 UPTAKE: T3 UPTAKE: 32 % (ref 22–35)

## 2015-12-19 LAB — T4: T4, Total: 6.5 ug/dL (ref 4.5–12.0)

## 2015-12-19 LAB — T4, FREE: Free T4: 1.1 ng/dL (ref 0.8–1.8)

## 2015-12-20 LAB — TSH: TSH: 0.03 m[IU]/L — AB

## 2015-12-25 ENCOUNTER — Ambulatory Visit (INDEPENDENT_AMBULATORY_CARE_PROVIDER_SITE_OTHER): Payer: Medicare Other | Admitting: Family Medicine

## 2015-12-25 VITALS — BP 140/78 | HR 57 | Temp 98.1°F | Resp 16 | Ht 62.0 in | Wt 177.8 lb

## 2015-12-25 DIAGNOSIS — E119 Type 2 diabetes mellitus without complications: Secondary | ICD-10-CM | POA: Diagnosis not present

## 2015-12-25 DIAGNOSIS — I429 Cardiomyopathy, unspecified: Secondary | ICD-10-CM | POA: Diagnosis not present

## 2015-12-25 DIAGNOSIS — I10 Essential (primary) hypertension: Secondary | ICD-10-CM

## 2015-12-25 LAB — POCT GLYCOSYLATED HEMOGLOBIN (HGB A1C): HEMOGLOBIN A1C: 5.8

## 2015-12-25 MED ORDER — METOPROLOL TARTRATE 50 MG PO TABS: 50.0000 mg | ORAL_TABLET | Freq: Two times a day (BID) | ORAL | Status: DC

## 2015-12-25 NOTE — Assessment & Plan Note (Signed)
Elevated but recheck is below 150/90 goal. HR is low- will reduce dose of Metoprolol- recheck 4 weeks.

## 2015-12-25 NOTE — Patient Instructions (Addendum)
Keep up the good work with your diabetes!  Please remember to schedule and eye exam. Please let us know if you would like a referral to [facility].    Please only take metoprolol 1 tablet twice daily. Your heart rate is a little low.  Your goal blood pressure is 150/90. Work on low salt/sodium diet - goal <1.5gm (1,500mg ) per day. Eat a diet high in fruits/vegetables and whole grains.  Look into mediterranean and DASH diet. Goal activity is 163min/wk of moderate intensity exercise.  This can be split into 30 minute chunks.  If you are not at this level, you can start with smaller 10-15 min increments and slowly build up activity. Look at [place].org for more resources

## 2015-12-25 NOTE — Progress Notes (Signed)
Subjective:    Patient ID: Carolyn Hernandez, female    DOB: 1930/06/14, 82 y.o.   MRN: [id number]  HPI: [name] is a 82 y.o. female presenting on 12/25/2015 for Diabetes   HPI  Pt presents for diabetes and hypertension follow-up.  Diabetes: A1c is 5.8% today. Pt is diet controlled. No numbness or tingling in her feet. Has not had eye exam this year.  Hypertension: No HA, SOB, chest pain. Has taken all her medication but metoprolol. Does feel dizzy occasionally with low HR.  Past Medical History  Diagnosis Date  . Arthritis   . Depression   . Hypertension     Current Outpatient Prescriptions on File Prior to Visit  Medication Sig  . aspirin 81 MG tablet Take 81 mg by mouth daily.  . camphor-menthol (SARNA) lotion Apply 1 application topically as needed for itching.  . citalopram (CELEXA) 20 MG tablet Take 1 tablet (20 mg total) by mouth daily.  . diclofenac sodium (VOLTAREN) 1 % GEL Apply 2 g topically 4 (four) times daily.  . furosemide (LASIX) 40 MG tablet Take 1 tablet (40 mg total) by mouth daily.  Marland Kitchen lisinopril (PRINIVIL,ZESTRIL) 40 MG tablet TAKE ONE (1) TABLET BY MOUTH EVERY DAY  . NIFEdipine (ADALAT CC) 90 MG 24 hr tablet Take 90 mg by mouth daily.  . potassium chloride SA (K-DUR,KLOR-CON) 20 MEQ tablet Take 20 mEq by mouth daily.  . Vitamin D, Ergocalciferol, (DRISDOL) 50000 units CAPS capsule Take 1 capsule (50,000 Units total) by mouth every 7 (seven) days.   No current facility-administered medications on file prior to visit.    Review of Systems  Constitutional: Negative for fever and chills.  HENT: Negative.   Respiratory: Negative for cough, chest tightness and wheezing.   Cardiovascular: Negative for chest pain and leg swelling.  Gastrointestinal: Negative for nausea, vomiting, abdominal pain, diarrhea and constipation.  Endocrine: Negative.  Negative for cold intolerance, heat intolerance, polydipsia, polyphagia and polyuria.  Genitourinary:  Negative for dysuria and difficulty urinating.  Musculoskeletal: Negative.   Neurological: Negative for dizziness, light-headedness and numbness.  Psychiatric/Behavioral: Negative.    Per HPI unless specifically indicated above     Objective:    BP 140/78 mmHg  Pulse 57  Temp(Src) 98.1 F (36.7 C) (Oral)  Resp 16  Ht 5\' 2"  (1.575 m)  Wt 177 lb 12.8 oz (80.65 kg)  BMI 32.51 kg/m2  Wt Readings from Last 3 Encounters:  12/25/15 177 lb 12.8 oz (80.65 kg)  11/20/15 185 lb (83.915 kg)  10/09/15 185 lb (83.915 kg)    Physical Exam  Constitutional: She is oriented to person, place, and time. She appears well-developed and well-nourished.  HENT:  Head: Normocephalic and atraumatic.  Neck: Neck supple.  Cardiovascular: Regular rhythm, normal heart sounds and normal pulses.  Bradycardia present.  PMI is not displaced.  Exam reveals no gallop and no friction rub.   No murmur heard. Pulmonary/Chest: Effort normal and breath sounds normal. She has no wheezes. She exhibits no tenderness.  Abdominal: Soft. Normal appearance and bowel sounds are normal. She exhibits no distension and no mass. There is no tenderness. There is no rebound and no guarding.  Musculoskeletal: Normal range of motion. She exhibits no tenderness.       Right lower leg: She exhibits edema (+1).       Left lower leg: She exhibits edema (+1).  Lymphadenopathy:    She has no cervical adenopathy.  Neurological: She is alert and oriented  to person, place, and time.  Skin: Skin is warm and dry.   Results for orders placed or performed in visit on 12/25/15  POCT HgB A1C  Result Value Ref Range   Hemoglobin A1C 5.8       Assessment & Plan:   Problem List Items Addressed This Visit      Cardiovascular and Mediastinum   Essential (primary) hypertension    Elevated but recheck is below 150/90 goal. HR is low- will reduce dose of Metoprolol- recheck 4 weeks.       Relevant Medications   metoprolol (LOPRESSOR) 50 MG  tablet   Cardiomyopathy (HCC)   Relevant Medications   metoprolol (LOPRESSOR) 50 MG tablet     Endocrine   Controlled type 2 diabetes mellitus without complication ([place]) - Primary    Controlled. Recheck a1c 6 mos. Reminded pt to get eye exam.       Relevant Orders   POCT HgB A1C (Completed)      Meds ordered this encounter  Medications  . metoprolol (LOPRESSOR) 50 MG tablet    Sig: Take 1 tablet (50 mg total) by mouth 2 (two) times daily.    Dispense:  60 tablet    Refill:  11    Order Specific Question:  Supervising Provider    Answer:  Arlis Porta [phone]      Follow up plan: Return in about 4 weeks (around 01/22/2016) for BP check. Marland Kitchen

## 2015-12-25 NOTE — Assessment & Plan Note (Signed)
Controlled. Recheck a1c 6 mos. Reminded pt to get eye exam.

## 2016-01-29 ENCOUNTER — Ambulatory Visit (INDEPENDENT_AMBULATORY_CARE_PROVIDER_SITE_OTHER): Payer: Medicare Other | Admitting: Family Medicine

## 2016-01-29 VITALS — BP 154/76 | HR 70 | Temp 98.3°F | Resp 16 | Ht 62.0 in | Wt 170.0 lb

## 2016-01-29 DIAGNOSIS — E119 Type 2 diabetes mellitus without complications: Secondary | ICD-10-CM | POA: Diagnosis not present

## 2016-01-29 DIAGNOSIS — I10 Essential (primary) hypertension: Secondary | ICD-10-CM

## 2016-01-29 DIAGNOSIS — R946 Abnormal results of thyroid function studies: Secondary | ICD-10-CM | POA: Diagnosis not present

## 2016-01-29 LAB — BASIC METABOLIC PANEL WITH GFR
BUN: 27 mg/dL — ABNORMAL HIGH (ref 7–25)
CHLORIDE: 105 mmol/L (ref 98–110)
CO2: 23 mmol/L (ref 20–31)
Calcium: 9.6 mg/dL (ref 8.6–10.4)
Creat: 1.1 mg/dL — ABNORMAL HIGH (ref 0.60–0.88)
GFR, EST NON AFRICAN AMERICAN: 46 mL/min — AB (ref >=60)
GFR, Est African American: 53 mL/min — ABNORMAL LOW (ref >=60)
Glucose, Bld: 93 mg/dL (ref 65–99)
POTASSIUM: 4.2 mmol/L (ref 3.5–5.3)
SODIUM: 140 mmol/L (ref 135–146)

## 2016-01-29 MED ORDER — METOPROLOL SUCCINATE ER 100 MG PO TB24
100.0000 mg | ORAL_TABLET | Freq: Every day | ORAL | 3 refills | Status: DC
Start: 2016-01-29 — End: 2016-04-01

## 2016-01-29 NOTE — Assessment & Plan Note (Signed)
Pt is still feeling tired but has opted not to got to Endocrinology to treat her subclinical hyperthyroidism.

## 2016-01-29 NOTE — Assessment & Plan Note (Signed)
Referral for eye exam placed today.

## 2016-01-29 NOTE — Assessment & Plan Note (Addendum)
Elevated but HR is WNL. Will try toprol XL to control BP. Pt will monitor BP closely at home. Recheck BMP to check on kidney function today. Recheck 2 mos.

## 2016-01-29 NOTE — Progress Notes (Signed)
Subjective:    Patient ID: Carolyn Hernandez, female    DOB: December 08, 1929, 82 y.o.   MRN: [id number]  HPI: Carolyn Hernandez is a 82 y.o. female presenting on 01/29/2016 for Diabetes   HPI  Pt presents for recheck of blood pressure. Metoprolol was reduced last visit due to low HR. Pt is not visit. Feels a little tired.  Has not had diabetic eye exam.   Past Medical History:  Diagnosis Date  . Arthritis   . Depression   . Hypertension     Current Outpatient Prescriptions on File Prior to Visit  Medication Sig  . aspirin 81 MG tablet Take 81 mg by mouth daily.  . camphor-menthol (SARNA) lotion Apply 1 application topically as needed for itching.  . citalopram (CELEXA) 20 MG tablet Take 1 tablet (20 mg total) by mouth daily.  . diclofenac sodium (VOLTAREN) 1 % GEL Apply 2 g topically 4 (four) times daily.  . furosemide (LASIX) 40 MG tablet Take 1 tablet (40 mg total) by mouth daily.  Marland Kitchen lisinopril (PRINIVIL,ZESTRIL) 40 MG tablet TAKE ONE (1) TABLET BY MOUTH EVERY DAY  . NIFEdipine (ADALAT CC) 90 MG 24 hr tablet Take 90 mg by mouth daily.  . potassium chloride SA (K-DUR,KLOR-CON) 20 MEQ tablet Take 20 mEq by mouth daily.  . Vitamin D, Ergocalciferol, (DRISDOL) 50000 units CAPS capsule Take 1 capsule (50,000 Units total) by mouth every 7 (seven) days.   No current facility-administered medications on file prior to visit.     Review of Systems  Constitutional: Negative for chills and fever.  HENT: Negative.   Respiratory: Negative for cough, chest tightness and wheezing.   Cardiovascular: Negative for chest pain and leg swelling.  Gastrointestinal: Negative for abdominal pain, constipation, diarrhea, nausea and vomiting.  Endocrine: Negative.  Negative for cold intolerance, heat intolerance, polydipsia, polyphagia and polyuria.  Genitourinary: Negative for difficulty urinating and dysuria.  Musculoskeletal: Negative.   Neurological: Negative for dizziness, light-headedness  and numbness.  Psychiatric/Behavioral: Negative.    Per HPI unless specifically indicated above     Objective:    BP (!) 154/76 (BP Location: Left Arm)   Pulse 70   Temp 98.3 F (36.8 C) (Oral)   Resp 16   Ht 5\' 2"  (1.575 m)   Wt 170 lb (77.1 kg)   BMI 31.09 kg/m   Wt Readings from Last 3 Encounters:  01/29/16 170 lb (77.1 kg)  12/25/15 177 lb 12.8 oz (80.6 kg)  11/20/15 185 lb (83.9 kg)    Physical Exam  Constitutional: She is oriented to person, place, and time. She appears well-developed and well-nourished.  HENT:  Head: Normocephalic and atraumatic.  Neck: Neck supple.  Cardiovascular: Normal rate, regular rhythm and normal heart sounds.  Exam reveals no gallop and no friction rub.   No murmur heard. Pulmonary/Chest: Effort normal and breath sounds normal. She has no wheezes. She exhibits no tenderness.  Abdominal: Soft. Normal appearance and bowel sounds are normal. She exhibits no distension and no mass. There is no tenderness. There is no rebound and no guarding.  Musculoskeletal: Normal range of motion. She exhibits edema. She exhibits no tenderness.  Lymphadenopathy:    She has no cervical adenopathy.  Neurological: She is alert and oriented to person, place, and time.  Skin: Skin is warm and dry.   Results for orders placed or performed in visit on 12/25/15  POCT HgB A1C  Result Value Ref Range   Hemoglobin A1C 5.8  Assessment & Plan:   Problem List Items Addressed This Visit      Cardiovascular and Mediastinum   Essential (primary) hypertension - Primary    Elevated but HR is WNL. Will try toprol XL to control BP. Pt will monitor BP closely at home. Recheck BMP to check on kidney function today. Recheck 2 mos.       Relevant Medications   metoprolol succinate (TOPROL-XL) 100 MG 24 hr tablet   Other Relevant Orders   BASIC METABOLIC PANEL WITH GFR     Endocrine   Controlled type 2 diabetes mellitus without complication ([place])    Referral for  eye exam placed today.       Relevant Orders   Ambulatory referral to Ophthalmology     Other   Abnormal results of thyroid function studies    Pt is still feeling tired but has opted not to got to Endocrinology to treat her subclinical hyperthyroidism.        Other Visit Diagnoses   None.     Meds ordered this encounter  Medications  . metoprolol succinate (TOPROL-XL) 100 MG 24 hr tablet    Sig: Take 1 tablet (100 mg total) by mouth daily. Take with or immediately following a meal.    Dispense:  90 tablet    Refill:  3    Order Specific Question:   Supervising Provider    Answer:   Arlis Porta [id number]      Follow up plan: Return in about 2 months (around 03/30/2016), or if symptoms worsen or fail to improve.

## 2016-01-29 NOTE — Patient Instructions (Signed)
Please seek immediate medical attention at ER or Urgent Care if you develop: Chest pain, pressure or tightness. Shortness of breath accompanied by nausea or diaphoresis Visual changes Numbness or tingling on one side of the body Facial droop Altered mental status Or any concerning symptoms.  Your goal blood pressure is  140/90 or 150/90 Please check your blood pressure cuff.  Work on low salt/sodium diet - goal <1.5gm (1,500mg ) per day. Eat a diet high in fruits/vegetables and whole grains.  Look into mediterranean and DASH diet. Goal activity is 11min/wk of moderate intensity exercise.  This can be split into 30 minute chunks.  If you are not at this level, you can start with smaller 10-15 min increments and slowly build up activity. Look at [place].org for more resources

## 2016-02-07 ENCOUNTER — Telehealth: Payer: Self-pay | Admitting: Family Medicine

## 2016-02-07 NOTE — Telephone Encounter (Signed)
After calling to inquire about appt to [facility]. Referral was refaxed b/c the said they didn't receive. Now noted that it was scheduled and patient cancelled. It is noted that patient prefers Monday appt and contact info on referral.

## 2016-02-07 NOTE — Telephone Encounter (Signed)
Pt was referred to [facility] and an appt was made for Aug 18th.  Pt later called to cancel.

## 2016-04-01 ENCOUNTER — Encounter: Payer: Self-pay | Admitting: Family Medicine

## 2016-04-01 ENCOUNTER — Ambulatory Visit (INDEPENDENT_AMBULATORY_CARE_PROVIDER_SITE_OTHER): Payer: Medicare Other | Admitting: Family Medicine

## 2016-04-01 VITALS — BP 139/69 | HR 59 | Temp 98.1°F | Resp 16 | Ht 62.0 in | Wt 172.6 lb

## 2016-04-01 DIAGNOSIS — F321 Major depressive disorder, single episode, moderate: Secondary | ICD-10-CM

## 2016-04-01 DIAGNOSIS — I10 Essential (primary) hypertension: Secondary | ICD-10-CM | POA: Diagnosis not present

## 2016-04-01 DIAGNOSIS — R946 Abnormal results of thyroid function studies: Secondary | ICD-10-CM | POA: Diagnosis not present

## 2016-04-01 DIAGNOSIS — E119 Type 2 diabetes mellitus without complications: Secondary | ICD-10-CM

## 2016-04-01 LAB — T4, FREE: FREE T4: 1.1 ng/dL (ref 0.8–1.8)

## 2016-04-01 LAB — TSH: TSH: 0.01 mIU/L — ABNORMAL LOW

## 2016-04-01 LAB — T3, FREE: T3 FREE: 3.6 pg/mL (ref 2.3–4.2)

## 2016-04-01 LAB — POCT GLYCOSYLATED HEMOGLOBIN (HGB A1C): HEMOGLOBIN A1C: 5.8

## 2016-04-01 MED ORDER — CITALOPRAM HYDROBROMIDE 20 MG PO TABS: 20.0000 mg | ORAL_TABLET | Freq: Every day | ORAL | 3 refills | Status: DC

## 2016-04-01 MED ORDER — LISINOPRIL 40 MG PO TABS: 40.0000 mg | ORAL_TABLET | Freq: Every day | ORAL | 3 refills | Status: DC

## 2016-04-01 MED ORDER — METOPROLOL TARTRATE 50 MG PO TABS: 50.0000 mg | ORAL_TABLET | Freq: Two times a day (BID) | ORAL | 3 refills | Status: DC

## 2016-04-01 MED ORDER — NIFEDIPINE ER 90 MG PO TB24: 90.0000 mg | ORAL_TABLET | Freq: Every day | ORAL | 3 refills | Status: DC

## 2016-04-01 NOTE — Assessment & Plan Note (Signed)
Diabetes well controlled with diet and lifestyle changes. No medications at this time. Will continue to monitor. Diabetic eye exam is UTD. Will need repeat UA microalbumin in January. Foot exam UTD.  ACE for renal protection.

## 2016-04-01 NOTE — Assessment & Plan Note (Signed)
Doing well on celexa. Discussed need to be involved in activities and leave the house to help with mood and anxiety. Pt waiting to get a place at a senior living apartment where they will have activities- family hopes this will help depression.

## 2016-04-01 NOTE — Progress Notes (Signed)
Subjective:    Patient ID: Carolyn Hernandez, female    DOB: 1929/08/11, 82 y.o.   MRN: [id number]  HPI: Carolyn Hernandez is a 82 y.o. female presenting on 04/01/2016 for Diabetes   HPI  Pt presents for diabetes follow-up today. A1c is stable at 5.8%.  She trialed Toprol XL for BP as opposed to metoprolol twice daily. She did not like the way she felt on the metoprolol. She has gone back to taking metoprolol 50mg  twice daily. Doing well. No chest pain. No shortness of breath. No dizziness. No visual changes.  Pt has had subclinical hyperthyroidism. Previously declined imaging or referral to endocrinology.  Pt declines flu shot today.  Bruising on her R hand- hit on table. Pt reports not painful.  Her depression is doing well. Taking 20mg  of celexa daily.  She reports she does not leaver her house much. Sits at home most days which causes her to "sit and think." Sometimes her fears and deaths in her family get on her mind.  Family is trying to make her participate in more activities to help with her mood.  Past Medical History:  Diagnosis Date  . Arthritis   . Depression   . Hypertension     Current Outpatient Prescriptions on File Prior to Visit  Medication Sig  . aspirin 81 MG tablet Take 81 mg by mouth daily.  . camphor-menthol (SARNA) lotion Apply 1 application topically as needed for itching.  . diclofenac sodium (VOLTAREN) 1 % GEL Apply 2 g topically 4 (four) times daily.  . furosemide (LASIX) 40 MG tablet Take 1 tablet (40 mg total) by mouth daily.  . potassium chloride SA (K-DUR,KLOR-CON) 20 MEQ tablet Take 20 mEq by mouth daily.  . Vitamin D, Ergocalciferol, (DRISDOL) 50000 units CAPS capsule Take 1 capsule (50,000 Units total) by mouth every 7 (seven) days.   No current facility-administered medications on file prior to visit.     Review of Systems  Constitutional: Negative for chills and fever.  HENT: Negative.   Respiratory: Negative for cough, chest tightness  and wheezing.   Cardiovascular: Negative for chest pain and leg swelling.  Gastrointestinal: Negative for abdominal pain, constipation, diarrhea, nausea and vomiting.  Endocrine: Negative.  Negative for cold intolerance, heat intolerance, polydipsia, polyphagia and polyuria.  Genitourinary: Negative for difficulty urinating and dysuria.  Musculoskeletal: Positive for joint swelling.  Neurological: Negative for dizziness, light-headedness and numbness.  Psychiatric/Behavioral: Negative.    Per HPI unless specifically indicated above Depression screen [facility] 2/9 04/01/2016 11/20/2015 10/09/2015 08/28/2015  Decreased Interest 1 0 1 3  Down, Depressed, Hopeless 1 1 1 2   PHQ - 2 Score 2 1 2 5   Altered sleeping 0 1 3 3   Tired, decreased energy 1 3 3 3   Change in appetite 0 - 0 3  Feeling bad or failure about yourself  0 0 0 0  Trouble concentrating 0 0 0 3  Moving slowly or fidgety/restless 1 0 0 0  Suicidal thoughts 0 0 0 0  PHQ-9 Score 4 5 8 17   Difficult doing work/chores Not difficult at all - - -       Objective:    BP 139/69   Pulse (!) 59   Temp 98.1 F (36.7 C) (Oral)   Resp 16   Ht 5\' 2"  (1.575 m)   Wt 172 lb 9.6 oz (78.3 kg)   BMI 31.57 kg/m   Wt Readings from Last 3 Encounters:  04/01/16 172 lb 9.6 oz (  78.3 kg)  01/29/16 170 lb (77.1 kg)  12/25/15 177 lb 12.8 oz (80.6 kg)    Physical Exam  Constitutional: She is oriented to person, place, and time. She appears well-developed and well-nourished.  HENT:  Head: Normocephalic and atraumatic.  Neck: Neck supple. No thyromegaly present.  Cardiovascular: Normal rate, regular rhythm and normal heart sounds.  Exam reveals no gallop and no friction rub.   No murmur heard. Pulmonary/Chest: Effort normal and breath sounds normal. She has no wheezes. She exhibits no tenderness.  Abdominal: Soft. Normal appearance and bowel sounds are normal. She exhibits no distension and no mass. There is no tenderness. There is no rebound and no  guarding.  Musculoskeletal: Normal range of motion. She exhibits no edema or tenderness.       Right hand: She exhibits swelling (echymosis along the 2nd and 3rd MCP. Non-tender.). She exhibits normal range of motion, no tenderness, normal capillary refill and no deformity.  Lymphadenopathy:    She has no cervical adenopathy.  Neurological: She is alert and oriented to person, place, and time.  Skin: Skin is warm and dry.     Diabetic Foot Exam - Simple   Simple Foot Form Diabetic Foot exam was performed with the following findings:  Yes 04/01/2016  3:33 PM  Visual Inspection No deformities, no ulcerations, no other skin breakdown bilaterally:  Yes Sensation Testing Intact to touch and monofilament testing bilaterally:  Yes Pulse Check Posterior Tibialis and Dorsalis pulse intact bilaterally:  Yes Comments     Results for orders placed or performed in visit on 04/01/16  POCT HgB A1C  Result Value Ref Range   Hemoglobin A1C 5.8       Assessment & Plan:   Problem List Items Addressed This Visit      Cardiovascular and Mediastinum   Essential (primary) hypertension   Relevant Medications   metoprolol (LOPRESSOR) 50 MG tablet   NIFEdipine (ADALAT CC) 90 MG 24 hr tablet   lisinopril (PRINIVIL,ZESTRIL) 40 MG tablet   Other Relevant Orders   BASIC METABOLIC PANEL WITH GFR     Endocrine   Controlled type 2 diabetes mellitus without complication ([place]) - Primary    Diabetes well controlled with diet and lifestyle changes. No medications at this time. Will continue to monitor. Diabetic eye exam is UTD. Will need repeat UA microalbumin in January. Foot exam UTD.  ACE for renal protection.       Relevant Medications   lisinopril (PRINIVIL,ZESTRIL) 40 MG tablet   Other Relevant Orders   POCT HgB A1C (Completed)   BASIC METABOLIC PANEL WITH GFR     Other   Clinical depression    Doing well on celexa. Discussed need to be involved in activities and leave the house to help with  mood and anxiety. Pt waiting to get a place at a senior living apartment where they will have activities- family hopes this will help depression.       Relevant Medications   citalopram (CELEXA) 20 MG tablet   Abnormal results of thyroid function studies    TSH has been low with normal T3/T4- pt has declined imaging or endocrinology referral in the past. Will recheck today. Pt is agreeable to ultrasound with abnormal TSH.      Relevant Orders   TSH   T3, free   T4, free   T3 Uptake    Other Visit Diagnoses   None.     Meds ordered this encounter  Medications  . metoprolol (  LOPRESSOR) 50 MG tablet    Sig: Take 1 tablet (50 mg total) by mouth 2 (two) times daily.    Dispense:  180 tablet    Refill:  3    Order Specific Question:   Supervising Provider    Answer:   Arlis Porta [phone]  . NIFEdipine (ADALAT CC) 90 MG 24 hr tablet    Sig: Take 1 tablet (90 mg total) by mouth daily.    Dispense:  90 tablet    Refill:  3    Order Specific Question:   Supervising Provider    Answer:   Arlis Porta [phone]  . citalopram (CELEXA) 20 MG tablet    Sig: Take 1 tablet (20 mg total) by mouth daily.    Dispense:  90 tablet    Refill:  3    Order Specific Question:   Supervising Provider    Answer:   Arlis Porta [phone]  . lisinopril (PRINIVIL,ZESTRIL) 40 MG tablet    Sig: Take 1 tablet (40 mg total) by mouth daily.    Dispense:  90 tablet    Refill:  3    COULD PATIENT HAVE NEW RX FOR 90 DAY SUPPLY? THANKS!    Order Specific Question:   Supervising Provider    Answer:   Arlis Porta [[id number]]      Follow up plan: Return in about 6 months (around 09/30/2016), or if symptoms worsen or fail to improve, for Diabetes. Marland Kitchen

## 2016-04-01 NOTE — Patient Instructions (Addendum)
Keep up the good work with your diabetes. We will check your thyroid function today- if your hormones are still abnormal- we may get an ultrasound of your thyroid.  Your goal blood pressure is 140/90. Work on low salt/sodium diet - goal <1.5gm (1,500mg ) per day. Eat a diet high in fruits/vegetables and whole grains.  Look into mediterranean and DASH diet. Goal activity is 169min/wk of moderate intensity exercise.  This can be split into 30 minute chunks.  If you are not at this level, you can start with smaller 10-15 min increments and slowly build up activity. Look at [email or web address].[email or web address] for more resources  Please seek immediate medical attention at ER or Urgent Care if you develop: Chest pain, pressure or tightness. Shortness of breath accompanied by nausea or diaphoresis Visual changes Numbness or tingling on one side of the body Facial droop Altered mental status Or any concerning symptoms.

## 2016-04-01 NOTE — Assessment & Plan Note (Signed)
TSH has been low with normal T3/T4- pt has declined imaging or endocrinology referral in the past. Will recheck today. Pt is agreeable to ultrasound with abnormal TSH.

## 2016-04-02 LAB — BASIC METABOLIC PANEL WITH GFR
BUN: 23 mg/dL (ref 7–25)
CALCIUM: 9.4 mg/dL (ref 8.6–10.4)
CO2: 22 mmol/L (ref 20–31)
Chloride: 106 mmol/L (ref 98–110)
Creat: 1 mg/dL — ABNORMAL HIGH (ref 0.60–0.88)
GFR, EST NON AFRICAN AMERICAN: 52 mL/min — AB (ref >=60)
GFR, Est African American: 60 mL/min (ref >=60)
GLUCOSE: 128 mg/dL — AB (ref 65–99)
Potassium: 4.1 mmol/L (ref 3.5–5.3)
Sodium: 141 mmol/L (ref 135–146)

## 2016-04-02 LAB — T3 UPTAKE: T3 Uptake: 30 % (ref 22–35)

## 2016-08-30 ENCOUNTER — Encounter: Payer: Self-pay | Admitting: *Deleted

## 2016-09-02 ENCOUNTER — Ambulatory Visit: Payer: Medicare Other | Admitting: Family Medicine

## 2016-09-02 ENCOUNTER — Ambulatory Visit (INDEPENDENT_AMBULATORY_CARE_PROVIDER_SITE_OTHER): Payer: Medicare Other | Admitting: Nurse Practitioner

## 2016-09-02 VITALS — BP 140/70 | HR 71 | Temp 98.0°F | Resp 16 | Ht 62.0 in | Wt 175.0 lb

## 2016-09-02 DIAGNOSIS — F321 Major depressive disorder, single episode, moderate: Secondary | ICD-10-CM | POA: Diagnosis not present

## 2016-09-02 DIAGNOSIS — F329 Major depressive disorder, single episode, unspecified: Secondary | ICD-10-CM

## 2016-09-02 DIAGNOSIS — R011 Cardiac murmur, unspecified: Secondary | ICD-10-CM | POA: Diagnosis not present

## 2016-09-02 DIAGNOSIS — E119 Type 2 diabetes mellitus without complications: Secondary | ICD-10-CM | POA: Diagnosis not present

## 2016-09-02 DIAGNOSIS — R600 Localized edema: Secondary | ICD-10-CM

## 2016-09-02 DIAGNOSIS — E559 Vitamin D deficiency, unspecified: Secondary | ICD-10-CM

## 2016-09-02 DIAGNOSIS — R7989 Other specified abnormal findings of blood chemistry: Secondary | ICD-10-CM

## 2016-09-02 DIAGNOSIS — I10 Essential (primary) hypertension: Secondary | ICD-10-CM

## 2016-09-02 DIAGNOSIS — F32A Depression, unspecified: Secondary | ICD-10-CM

## 2016-09-02 DIAGNOSIS — R946 Abnormal results of thyroid function studies: Secondary | ICD-10-CM | POA: Diagnosis not present

## 2016-09-02 DIAGNOSIS — I499 Cardiac arrhythmia, unspecified: Secondary | ICD-10-CM | POA: Diagnosis not present

## 2016-09-02 LAB — POCT GLYCOSYLATED HEMOGLOBIN (HGB A1C): Hemoglobin A1C: 5.7

## 2016-09-02 MED ORDER — FUROSEMIDE 40 MG PO TABS: 40.0000 mg | ORAL_TABLET | Freq: Every day | ORAL | 3 refills | Status: DC

## 2016-09-02 MED ORDER — LISINOPRIL 40 MG PO TABS: 40.0000 mg | ORAL_TABLET | Freq: Every day | ORAL | 3 refills | Status: DC

## 2016-09-02 MED ORDER — METOPROLOL TARTRATE 50 MG PO TABS: 50.0000 mg | ORAL_TABLET | Freq: Two times a day (BID) | ORAL | 3 refills | Status: DC

## 2016-09-02 MED ORDER — POTASSIUM CHLORIDE CRYS ER 20 MEQ PO TBCR: 20.0000 meq | EXTENDED_RELEASE_TABLET | Freq: Every day | ORAL | 3 refills | Status: DC

## 2016-09-02 MED ORDER — CITALOPRAM HYDROBROMIDE 20 MG PO TABS: 20.0000 mg | ORAL_TABLET | Freq: Every day | ORAL | 3 refills | Status: DC

## 2016-09-02 MED ORDER — NIFEDIPINE ER 90 MG PO TB24: 90.0000 mg | ORAL_TABLET | Freq: Every day | ORAL | 3 refills | Status: DC

## 2016-09-02 NOTE — Assessment & Plan Note (Addendum)
Your blood pressure recheck was ok for today.  Watch your salt intake and continue taking all of your medicines as prescribed.  Check your blood pressure at home at least once per week and keep a log.  Your BP goal is 130/80.  Please call the clinic for follow up prior to 6 months if your blood pressure is higher than 170/90

## 2016-09-02 NOTE — Assessment & Plan Note (Signed)
Problem ongoing.  Risks of untreated hyperthyroidism discussed to include heart damage and osteopenia.  Patient declines further assessment of with thyroid ultrasound and DEXA scan.

## 2016-09-02 NOTE — Progress Notes (Signed)
Subjective:    Patient ID: Carolyn Hernandez, female    DOB: 1929/07/11, 83 y.o.   MRN: [id number]  Carolyn Hernandez is a 83 y.o. female presenting on 09/02/2016 for Diabetes She is accompanied today by her daughter.  HPI Diabetes Denies high and low symptoms of DM to include polyuria, polyphagia, and polydipsia, shakiness, weakness, diaphoresis.  Checks CBG at home occasionally:  Checks at least 2 hours after breakfast, but cannot recall the values.  Her last eye exam unknown.  Denies referral for opthalmology.  Denies loss of vision, blurry vision. Last foot exam October.  Denies tingling fingers/toes. Today POC A1C = 5.7  Hypertension, Irregular HR, Heart Murmur Pill box filled by patient checked by daughter-in-law.  She denies missing doses of medications.  She drinks 3-4 bottles of water during the day (approximately 2L). Denies headache, dizziness, heart palpitations, loss of consciousness, and chest pain.  Hyperthyroidism No symptoms of palpitations/sob, heat intolerance.  She declines DEXA and thyroid ultrasound for additional thyroid workup.    Depression Now at "[facility]" - [employer].  Doing great.  Sleeping better.  Improved mood. Feels safe there.  Both Carolyn Hernandez and her daughter are very happy with her move from her prior residence.     Social History  Substance Use Topics  . Smoking status: Never Smoker  . Smokeless tobacco: Not on file  . Alcohol use No    Review of Systems Per HPI unless specifically indicated above     Objective:    BP 140/70 (BP Location: Left Arm, Patient Position: Sitting, Cuff Size: Normal)   Pulse 71   Temp 98 F (36.7 C) (Oral)   Resp 16   Ht 5\' 2"  (1.575 m)   Wt 175 lb (79.4 kg)   BMI 32.01 kg/m   Wt Readings from Last 3 Encounters:  09/02/16 175 lb (79.4 kg)  04/01/16 172 lb 9.6 oz (78.3 kg)  01/29/16 170 lb (77.1 kg)    Physical Exam  Constitutional: She appears well-developed and well-nourished. No  distress.  HENT:  Head: Normocephalic and atraumatic.  Eyes: Conjunctivae are normal. Pupils are equal, round, and reactive to light.  Cardiovascular: Normal rate, S1 normal, S2 normal, intact distal pulses and normal pulses.  An irregular rhythm present.  Murmur heard.  Crescendo systolic murmur is present with a grade of 3/6  Pulmonary/Chest: Effort normal and breath sounds normal. No respiratory distress. She has no wheezes. She has no rales.  Abdominal: Soft. Bowel sounds are normal.  Musculoskeletal:       Right knee: She exhibits swelling.       Left knee: She exhibits decreased range of motion and swelling.  Crepitus present on ROM.  Skin: She is not diaphoretic.   Results for orders placed or performed in visit on 09/02/16  POCT HgB A1C  Result Value Ref Range   Hemoglobin A1C 5.7       Assessment & Plan:   Problem List Items Addressed This Visit    Depressive disorder    Mood is much improved with new residence in a senior apartment.  Continue getting out and socializing with others.  Continue your citalopram as ordered.      Relevant Medications   citalopram (CELEXA) 20 MG tablet   HTN (hypertension)    Your blood pressure recheck was ok for today.  Watch your salt intake and continue taking all of your medicines as prescribed.  Check your blood pressure at home at least  once per week and keep a log.        Relevant Medications   furosemide (LASIX) 40 MG tablet   lisinopril (PRINIVIL,ZESTRIL) 40 MG tablet   metoprolol (LOPRESSOR) 50 MG tablet   NIFEdipine (ADALAT CC) 90 MG 24 hr tablet   potassium chloride SA (K-DUR,KLOR-CON) 20 MEQ tablet   Other Relevant Orders   COMPLETE METABOLIC PANEL WITH GFR   Type 2 diabetes mellitus without complication (HCC) - Primary    Diabetes is still well controlled.  Hgb A1C was 5.7 today.  Continue lifestyle management.      Relevant Medications   lisinopril (PRINIVIL,ZESTRIL) 40 MG tablet   Other Relevant Orders   POCT HgB  A1C (Completed)   COMPLETE METABOLIC PANEL WITH GFR   Lipid Profile   Pedal edema   Relevant Medications   furosemide (LASIX) 40 MG tablet   Murmur, heart    Would like an echocardiogram for evaluation of grade 3 holosystolic heart murmur.  Patient declines test at this time.  She will reconsider at the next office visit.       Other Visit Diagnoses    Moderate single current episode of major depressive disorder (HCC)       Relevant Medications   citalopram (CELEXA) 20 MG tablet   Vitamin D deficiency       Irregular heart rate       Relevant Orders   EKG 12-Lead      Meds ordered this encounter  Medications  . citalopram (CELEXA) 20 MG tablet    Sig: Take 1 tablet (20 mg total) by mouth daily.    Dispense:  90 tablet    Refill:  3    Order Specific Question:   Supervising Provider    Answer:   Olin Hauser [[id number]]  . furosemide (LASIX) 40 MG tablet    Sig: Take 1 tablet (40 mg total) by mouth daily.    Dispense:  90 tablet    Refill:  3    Order Specific Question:   Supervising Provider    Answer:   Olin Hauser [[id number]]  . lisinopril (PRINIVIL,ZESTRIL) 40 MG tablet    Sig: Take 1 tablet (40 mg total) by mouth daily.    Dispense:  90 tablet    Refill:  3    COULD PATIENT HAVE NEW RX FOR 90 DAY SUPPLY? THANKS!    Order Specific Question:   Supervising Provider    Answer:   Olin Hauser [[id number]]  . metoprolol (LOPRESSOR) 50 MG tablet    Sig: Take 1 tablet (50 mg total) by mouth 2 (two) times daily.    Dispense:  180 tablet    Refill:  3    Order Specific Question:   Supervising Provider    Answer:   Olin Hauser [[id number]]  . NIFEdipine (ADALAT CC) 90 MG 24 hr tablet    Sig: Take 1 tablet (90 mg total) by mouth daily.    Dispense:  90 tablet    Refill:  3    Order Specific Question:   Supervising Provider    Answer:   Olin Hauser [[id number]]  . potassium chloride SA (K-DUR,KLOR-CON) 20 MEQ tablet    Sig: Take 1  tablet (20 mEq total) by mouth daily.    Dispense:  90 tablet    Refill:  3    Order Specific Question:   Supervising Provider    Answer:   Olin Hauser [[id number]]  Follow up plan: Return in about 6 months (around 03/05/2017) for BP check and diabetes.[name], DNP, AGPCNP-BC [facility] 09/02/2016, 2:49 PM

## 2016-09-02 NOTE — Assessment & Plan Note (Signed)
Diabetes is still well controlled.  Hgb A1C was 5.7 today.  Continue lifestyle management.

## 2016-09-02 NOTE — Assessment & Plan Note (Signed)
Mood is much improved with new residence in a senior apartment.  Continue getting out and socializing with others.  Continue your citalopram as ordered.

## 2016-09-02 NOTE — Patient Instructions (Addendum)
Thank you for coming to the clinic today.  1. High Blood Pressure: Check BP every week and keep a log.  Your Blood Pressure goal: 130/80.  Call us if BP 170/90.   2. Diabetes: Keep up the good work! 3. Thyroid: If you notice shortness of breath or palpitations let us know. 4. Heart Murmur:  We will re-adress this at your next visit and will do an echocardiogram if needed.   5. Your EKG was normal.  You have a few beats that are early, but ok.  Heart Murmur A heart murmur is an extra sound that is caused by chaotic blood flow. The murmur can be heard as a "hum" or "whoosh" sound when blood flows through the heart. The heart has four areas called chambers. Valves separate the upper and lower chambers from each other (tricuspid valve and mitral valve) and separate the lower chambers of the heart from pathways that lead away from the heart (aortic valve and pulmonary valve). Normally, the valves open to let blood flow through or out of your heart, and then they shut to keep the blood from flowing backward. There are two types of heart murmurs:  Innocent murmurs. Most people with this type of heart murmur do not have a heart problem. Many children have innocent heart murmurs. Your health care provider may suggest some basic testing to find out whether your murmur is an innocent murmur. If an innocent heart murmur is found, there is no need for further tests or treatment and no need to restrict activities or stop playing sports.  Abnormal murmurs. These types of murmurs can occur in children and adults. Abnormal murmurs may be a sign of a more serious heart condition, such as a heart defect present at birth (congenital defect) or heart valve disease. What are the causes? This condition is caused by heart valves that are not working properly. In children, abnormal heart murmurs are typically caused by congenital defects. In adults, abnormal murmurs are usually from heart valve problems caused by disease,  infection, or aging. Three types of heart valve defects can cause a murmur:  Regurgitation. This is when blood leaks back through the valve in the wrong direction.  Mitral valve prolapse. This is when the mitral valve of the heart has a loose flap and does not close tightly.  Stenosis. This is when a valve does not open enough and blocks blood flow. This condition may also be caused by:  Pregnancy.  Fever.  Overactive thyroid gland.  Anemia.  Exercise.  Rapid growth spurts (in children). What are the signs or symptoms? Innocent murmurs do not cause symptoms, and many people with abnormal murmurs may or may not have symptoms. If symptoms do develop, they may include:  Shortness of breath.  Blue coloring of the skin, especially on the fingertips.  Chest pain.  Palpitations, or feeling a fluttering or skipped heartbeat.  Fainting.  Persistent cough.  Getting tired much faster than expected.  Swelling in the abdomen, feet, or ankles. How is this diagnosed? This condition may be diagnosed during a routine physical or other exam. If your health care provider hears a murmur with a stethoscope, he or she will listen for:  Where the murmur is located in your heart.  How long the murmur lasts (duration).  When the murmur is heard during the heartbeat.  How loud the murmur is. This may help the health care provider figure out what is causing the murmur. You may be referred to a  heart specialist (cardiologist). You may also have other tests, including:  Electrocardiogram (ECG or EKG). This test measures the electrical activity of your heart.  Echocardiogram. This test uses high frequency sound waves to make pictures of your heart.  MRI or chest X-ray.  Cardiac catheterization. This test looks at blood flow through the heart. For children and adults who have an abnormal heart murmur and want to stay active, it is important to complete testing, review test results, and  receive recommendations from your health care provider. If heart disease is present, it may not be safe to play or be active. How is this treated? Heart murmurs themselves do not need treatment. In some cases, a heart murmur may go away on its own. If an underlying problem or disease is causing the murmur, you may need treatment. If treatment is needed, it will depend on the type and severity of the disease or heart problem causing the murmur. Treatment may include:  Medicine.  Surgery.  Dietary and lifestyle changes. Follow these instructions at home:  Talk with your health care provider before participating in sports or other activities that require a lot of effort and energy (are strenuous).  Learn as much as possible about your condition and any related diseases. Ask your health care provider if you may at risk for any medical emergencies.  Talk with your health care provider about what symptoms you should look out for.  It is up to you to get your test results. Ask your health care provider, or the department that is doing the test, when your results will be ready.  Keep all follow-up visits as told by your health care provider. This is important. Contact a health care provider if:  You feel light-headed.  You are frequently short of breath.  You feel more tired than usual.  You are having a hard time keeping up with normal activities or fitness routines.  You have swelling in your ankles or feet.  You have chest pain.  You notice that your heart often beats irregularly.  You develop any new symptoms. Get help right away if:  You develop severe chest pain.  You are having trouble breathing.  You have fainting spells.  Your symptoms suddenly get worse. These symptoms may represent a serious problem that is an emergency. Do not wait to see if the symptoms will go away. Get medical help right away. Call your local emergency services (911 in the [place] Do not drive  yourself to the hospital. Summary  Normally, the heart valves open to let blood flow through or out of your heart, and then they shut to keep the blood from flowing backward.  Heart murmur is caused by heart valves that are not working properly.  You may need treatment if an underlying problem or disease is causing the heart murmur. Treatment may include medicine, surgery, or dietary and lifestyle changes.  Talk with your health care provider before participating in sports or other activities that require a lot of effort and energy (are strenuous).  Talk with your health care provider about what symptoms you should watch out for. This information is not intended to replace advice given to you by your health care provider. Make sure you discuss any questions you have with your health care provider. Document Released: 07/11/2004 Document Revised: 05/22/2016 Document Reviewed: 05/22/2016 Elsevier Interactive Patient Education  2017 [place].  Please schedule a Follow- up visit in 6 months with [name], NP for BP check and  diabetes.

## 2016-09-02 NOTE — Assessment & Plan Note (Signed)
Pedal edema remains, but is unchanged.  Continue medications as ordered.

## 2016-09-02 NOTE — Progress Notes (Signed)
I have reviewed this encounter including the documentation in this note and/or discussed this patient with the provider, [name], AGPCNP-BC. I am certifying that I agree with the content of this note as supervising physician.  Nobie Putnam, [facility] 09/02/2016, 11:44 PM

## 2016-09-02 NOTE — Assessment & Plan Note (Addendum)
I would like an echocardiogram for evaluation of grade 3 holosystolic heart murmur.  Patient declines test at this time.  She will reconsider at the next office visit.

## 2016-09-09 ENCOUNTER — Other Ambulatory Visit: Payer: Medicare Other

## 2016-09-09 DIAGNOSIS — I10 Essential (primary) hypertension: Secondary | ICD-10-CM | POA: Diagnosis not present

## 2016-09-09 DIAGNOSIS — E119 Type 2 diabetes mellitus without complications: Secondary | ICD-10-CM | POA: Diagnosis not present

## 2016-09-10 LAB — COMPLETE METABOLIC PANEL WITH GFR
ALT: 9 U/L (ref 6–29)
AST: 16 U/L (ref 10–35)
Albumin: 3.4 g/dL — ABNORMAL LOW (ref 3.6–5.1)
Alkaline Phosphatase: 72 U/L (ref 33–130)
BUN: 25 mg/dL (ref 7–25)
CO2: 23 mmol/L (ref 20–31)
Calcium: 9.4 mg/dL (ref 8.6–10.4)
Chloride: 109 mmol/L (ref 98–110)
Creat: 1.17 mg/dL — ABNORMAL HIGH (ref 0.60–0.88)
GFR, Est African American: 49 mL/min — ABNORMAL LOW (ref >=60)
GFR, Est Non African American: 43 mL/min — ABNORMAL LOW (ref >=60)
Glucose, Bld: 83 mg/dL (ref 65–99)
Potassium: 4 mmol/L (ref 3.5–5.3)
Sodium: 142 mmol/L (ref 135–146)
Total Bilirubin: 0.3 mg/dL (ref 0.2–1.2)
Total Protein: 6.3 g/dL (ref 6.1–8.1)

## 2016-09-10 LAB — LIPID PANEL
Cholesterol: 218 mg/dL — ABNORMAL HIGH (ref <200)
HDL: 46 mg/dL — ABNORMAL LOW (ref >50)
LDL Cholesterol: 141 mg/dL — ABNORMAL HIGH (ref <100)
Total CHOL/HDL Ratio: 4.7 Ratio (ref <5.0)
Triglycerides: 156 mg/dL — ABNORMAL HIGH (ref <150)
VLDL: 31 mg/dL — ABNORMAL HIGH (ref <30)

## 2016-12-10 ENCOUNTER — Telehealth: Payer: Self-pay | Admitting: Nurse Practitioner

## 2016-12-10 NOTE — Telephone Encounter (Signed)
Called pt to schedule Annual Wellness Visit with [facility]  - knb  °

## 2017-03-10 ENCOUNTER — Encounter: Payer: Self-pay | Admitting: Nurse Practitioner

## 2017-03-10 ENCOUNTER — Ambulatory Visit (INDEPENDENT_AMBULATORY_CARE_PROVIDER_SITE_OTHER): Payer: Medicare Other | Admitting: Nurse Practitioner

## 2017-03-10 VITALS — BP 170/68 | HR 102 | Temp 97.7°F | Ht 62.0 in | Wt 169.4 lb

## 2017-03-10 DIAGNOSIS — Z23 Encounter for immunization: Secondary | ICD-10-CM | POA: Diagnosis not present

## 2017-03-10 DIAGNOSIS — E119 Type 2 diabetes mellitus without complications: Secondary | ICD-10-CM | POA: Diagnosis not present

## 2017-03-10 DIAGNOSIS — I10 Essential (primary) hypertension: Secondary | ICD-10-CM | POA: Diagnosis not present

## 2017-03-10 DIAGNOSIS — R600 Localized edema: Secondary | ICD-10-CM

## 2017-03-10 LAB — POCT GLYCOSYLATED HEMOGLOBIN (HGB A1C): Hemoglobin A1C: 6.1

## 2017-03-10 MED ORDER — FUROSEMIDE 40 MG PO TABS: 40.0000 mg | ORAL_TABLET | Freq: Every day | ORAL | 3 refills | Status: DC

## 2017-03-10 MED ORDER — LISINOPRIL 40 MG PO TABS: 40.0000 mg | ORAL_TABLET | Freq: Every day | ORAL | 3 refills | Status: DC

## 2017-03-10 MED ORDER — NIFEDIPINE ER 90 MG PO TB24: 90.0000 mg | ORAL_TABLET | Freq: Every day | ORAL | 3 refills | Status: DC

## 2017-03-10 NOTE — Assessment & Plan Note (Signed)
Pedal edema remains, but is unchanged.  Pt is taking furosemide daily, but reports has stopped taking potassium r/t making her nauseated.  Plan: 1. Continue medications furosemide and potassium as ordered. Will reduce daily dose of potassium if able after CMP lab today since pt has not been taking for last several weeks. 2. Follow up as needed.

## 2017-03-10 NOTE — Assessment & Plan Note (Signed)
BP elevated today in clinic, but has not taken medications today.  Home BP readings in goal range per pt recall.  No written readings to review.  Plan: 1. Continue to watch your salt intake. 2. Continue furosemide, lisinopril, nifedipine, and metoprolol w/o changes. 3. Reviewed BP goal < 140/90 (higher goal permitted r/t frail elderly woman who may not tolerate lower in clinic BP). 4. Follow up 6 months or sooner if needed.

## 2017-03-10 NOTE — Progress Notes (Signed)
Subjective:    Patient ID: Carolyn Hernandez, female    DOB: [birth year], 83 y.o.   MRN: [id number]  Carolyn Hernandez is a 83 y.o. female presenting on 03/10/2017 for Diabetes and Hypertension   HPI Hypertension - She is checking BP at home or outside of clinic.  Readings 130/80s - Current medications: nifedipine, lisinopril furosemide, metoprolol tolerating well without side effects.  Has stopped taking potassium.  - She is not currently symptomatic. - Pt denies headache, lightheadedness, dizziness, changes in vision, chest tightness/pressure, palpitations, leg swelling, sudden loss of speech or loss of consciousness. - She  reports an exercise routine that includes walking her apartment hall 1-2x per day. - ADLs, iADLs - shopping, errands are assisted otherwise is able to care for self   - Her diet is moderate in salt, moderate in fat, and moderate in carbohydrates. - Family helps prepare dinner meals, lunch (sandwiches and soups).    Diabetes Pt presents today for follow up of Type 2 diabetes mellitus. She is not checking CBG at home - Current diabetic medications include: none - She is not currently symptomatic.  - She denies polydipsia, polyphagia, polyuria, headaches, diaphoresis, shakiness, chills, pain, numbness or tingling in extremities and changes in vision.   - Clinical course has been stable. - Weight trend: stable  PREVENTION: Eye exam current (within one year): unknown Foot exam current (within one year): yes Lipid/ASCVD risk reduction - on statin: *no - declined for advanced age Kidney protection - on ace or arb: on lisinopril  Recent Labs  04/01/16 1319 09/02/16 1400 03/10/17 1100  HGBA1C 5.8 5.7 6.1   Rash Pt also notes a red, flaky spot on r arm.  Non-pruritic.  Intermittent, returns after using ointment for several weeks.  Social History  Substance Use Topics  . Smoking status: Never Smoker  . Smokeless tobacco: Never Used  . Alcohol use No     Review of Systems Per HPI unless specifically indicated above     Objective:    BP (!) 170/68 (BP Location: Right Arm, Patient Position: Sitting, Cuff Size: Large)   Pulse (!) 102   Temp 97.7 F (36.5 C) (Oral)   Ht 5\' 2"  (1.575 m)   Wt 169 lb 6.4 oz (76.8 kg)   BMI 30.98 kg/m    Wt Readings from Last 3 Encounters:  03/10/17 169 lb 6.4 oz (76.8 kg)  09/02/16 175 lb (79.4 kg)  04/01/16 172 lb 9.6 oz (78.3 kg)    Physical Exam  General - overweight, well-appearing, NAD HEENT - Normocephalic, atraumatic, hearing loss bilaterally Heart - RRR, 2/6 systolic murmur heard Lungs - Clear throughout all lobes, no wheezing, crackles, or rhonchi. Normal work of breathing. Extremeties - non-tender, +1 edema, cap refill < 2 seconds, peripheral pulses intact +2 bilaterally Skin - warm, dry Neuro - awake, alert, oriented x3, antalgic, stooped gait using walker  Psych - Normal mood and affect, normal behavior   Diabetic Foot Form - Detailed   Diabetic Foot Exam - detailed Visual Foot Exam completed.:  Yes  Can the patient see the bottom of their feet?:  No Are the shoes appropriate in style and fit?:  No Is there swelling or and abnormal foot shape?:  No Is there a claw toe deformity?:  No Is there elevated skin temparature?:  No Is there foot or ankle muscle weakness?:  Yes Normal Range of Motion:  No Pulse Foot Exam completed.:  Yes  Right posterior Tibialias:  Present Left posterior  Tibialias:  Present  Right Dorsalis Pedis:  Present Left Dorsalis Pedis:  Present  Sensory Foot Exam Completed.:  Yes Semmes-Weinstein Monofilament Test R Site 1-Great Toe:  Neg L Site 1-Great Toe:  Neg        Results for orders placed or performed in visit on 03/10/17  POCT glycosylated hemoglobin (Hb A1C)  Result Value Ref Range   Hemoglobin A1C 6.1       Assessment & Plan:   Problem List Items Addressed This Visit      Cardiovascular and Mediastinum   HTN (hypertension)    BP  elevated today in clinic, but has not taken medications today.  Home BP readings in goal range per pt recall.  No written readings to review.  Plan: 1. Continue to watch your salt intake. 2. Continue furosemide, lisinopril, nifedipine, and metoprolol w/o changes. 3. Reviewed BP goal < 140/90 (higher goal permitted r/t frail elderly woman who may not tolerate lower in clinic BP). 4. Follow up 6 months or sooner if needed.      Relevant Medications   furosemide (LASIX) 40 MG tablet   lisinopril (PRINIVIL,ZESTRIL) 40 MG tablet   NIFEdipine (ADALAT CC) 90 MG 24 hr tablet   Other Relevant Orders   Comprehensive metabolic panel     Endocrine   Diabetes mellitus type II, controlled, with no complications ([place]) - Primary    Diabetes is still well controlled, but very slow worsening over time. Hgb A1C was 6.1 today up from 5.7% 3 months ago.   Plan: 1. Continue lifestyle management w/ focus on reducing carbohydrates and increasing vegetables and proteins to prevent weight loss from reducing carb intake. 2. Follow up 6 months since A1c mostly stable off medications.      Relevant Medications   lisinopril (PRINIVIL,ZESTRIL) 40 MG tablet   Other Relevant Orders   POCT glycosylated hemoglobin (Hb A1C) (Completed)   Comprehensive metabolic panel     Other   Pedal edema    Pedal edema remains, but is unchanged.  Pt is taking furosemide daily, but reports has stopped taking potassium r/t making her nauseated.  Plan: 1. Continue medications furosemide and potassium as ordered. Will reduce daily dose of potassium if able after CMP lab today since pt has not been taking for last several weeks. 2. Follow up as needed.      Relevant Medications   furosemide (LASIX) 40 MG tablet    Other Visit Diagnoses    Need for immunization against influenza       Pt agrees to flu vaccine r/t living in community spaces.  Now lives in senior apartment. Administer high dose fluzone today.   Relevant Orders    Flu vaccine HIGH DOSE PF (Fluzone High dose) (Completed)   Need for pneumococcal vaccine       Pt agrees to pneumonia vaccine r/t living in community spaces.  Now lives in senior apartment. Administer Prevnar 13 today.   Relevant Orders   Pneumococcal conjugate vaccine 13-valent (Completed)      Meds ordered this encounter  Medications  . furosemide (LASIX) 40 MG tablet    Sig: Take 1 tablet (40 mg total) by mouth daily.    Dispense:  90 tablet    Refill:  3  . lisinopril (PRINIVIL,ZESTRIL) 40 MG tablet    Sig: Take 1 tablet (40 mg total) by mouth daily.    Dispense:  90 tablet    Refill:  3  . NIFEdipine (ADALAT CC) 90 MG 24 hr  tablet    Sig: Take 1 tablet (90 mg total) by mouth daily.    Dispense:  90 tablet    Refill:  3    Follow up plan: Return in about 6 months (around 09/07/2017) for diabetes, hypertension.  [name], DNP, AGPCNP-BC Adult Gerontology Primary Care Nurse Practitioner [facility] 03/10/2017, 5:07 PM

## 2017-03-10 NOTE — Assessment & Plan Note (Signed)
Diabetes is still well controlled, but very slow worsening over time. Hgb A1C was 6.1 today up from 5.7% 3 months ago.   Plan: 1. Continue lifestyle management w/ focus on reducing carbohydrates and increasing vegetables and proteins to prevent weight loss from reducing carb intake. 2. Follow up 6 months since A1c mostly stable off medications.

## 2017-03-10 NOTE — Patient Instructions (Addendum)
Carolyn Hernandez, Thank you for coming in to clinic today.  1. GREAT WORK! - Keep up your activity. - Keep up your good diet and eating patterns.  2. Your blood pressure is high today. - IF you check a blood pressure at home and it is higher than 150/90, call the clinic. - No medicine changes except for potassium.  But wait on instructions until after your bloodwork is complete.   Your provider would like to you have your annual eye exam. Please contact your current eye doctor or here are some good options for you to contact.   You will need an opthalmology exam to screen for retina changes from diabetes and high blood pressure.  [facility] Address: [address] [place], [place] [address]   Address: [address], [place], [place] [address]  Phone: [phone]      Phone: [phone]  Website: [email or web address]    Website: [email or web address]     [facility]  Address: [place], [place], [place] [address]   Address: [place], [place], [place] [address] Phone: [phone]      Phone: [phone]    [facility] Address: [address], [place], [place] [address]  Phone: [phone]   3. For your arm: - This likely a fungal skin infection. - Use an over the counter antifungal cream.  Lotrimin or other active ingredient with clotrimazole. Apply twice daily for 14 days, then stop.   Please schedule a follow-up appointment with [name], AGNP. Return in about 6 months (around 09/07/2017) for diabetes, hypertension.  If you have any other questions or concerns, please feel free to call the clinic or send a message through [place]. You may also schedule an earlier appointment if necessary.  You will receive a survey after today's visit either digitally by e-mail or paper by [employer]. Your experiences and feedback matter to [place].  Please respond so we know how we are doing as we provide care for  you.   [name], DNP, AGNP-BC Adult Gerontology Nurse Practitioner [place]

## 2017-03-11 ENCOUNTER — Other Ambulatory Visit: Payer: Self-pay

## 2017-03-11 DIAGNOSIS — I10 Essential (primary) hypertension: Secondary | ICD-10-CM

## 2017-03-11 DIAGNOSIS — R600 Localized edema: Secondary | ICD-10-CM

## 2017-03-11 LAB — COMPREHENSIVE METABOLIC PANEL
AG Ratio: 1.4 (calc) (ref 1.0–2.5)
ALT: 13 U/L (ref 6–29)
AST: 18 U/L (ref 10–35)
Albumin: 4.1 g/dL (ref 3.6–5.1)
Alkaline phosphatase (APISO): 91 U/L (ref 33–130)
BUN/Creatinine Ratio: 17 (calc) (ref 6–22)
BUN: 20 mg/dL (ref 7–25)
CO2: 23 mmol/L (ref 20–32)
Calcium: 10.1 mg/dL (ref 8.6–10.4)
Chloride: 108 mmol/L (ref 98–110)
Creat: 1.18 mg/dL — ABNORMAL HIGH (ref 0.60–0.88)
Globulin: 3 g/dL (calc) (ref 1.9–3.7)
Glucose, Bld: 116 mg/dL — ABNORMAL HIGH (ref 65–99)
Potassium: 3.5 mmol/L (ref 3.5–5.3)
Sodium: 143 mmol/L (ref 135–146)
Total Bilirubin: 0.4 mg/dL (ref 0.2–1.2)
Total Protein: 7.1 g/dL (ref 6.1–8.1)

## 2017-03-11 MED ORDER — FUROSEMIDE 40 MG PO TABS: 40.0000 mg | ORAL_TABLET | Freq: Every day | ORAL | 3 refills | Status: DC

## 2017-03-11 MED ORDER — NIFEDIPINE ER 90 MG PO TB24: 90.0000 mg | ORAL_TABLET | Freq: Every day | ORAL | 3 refills | Status: DC

## 2017-03-11 MED ORDER — LISINOPRIL 40 MG PO TABS: 40.0000 mg | ORAL_TABLET | Freq: Every day | ORAL | 3 refills | Status: DC

## 2017-04-23 ENCOUNTER — Other Ambulatory Visit: Payer: Self-pay

## 2017-09-08 ENCOUNTER — Ambulatory Visit: Payer: Medicare Other | Admitting: Nurse Practitioner

## 2017-09-15 ENCOUNTER — Ambulatory Visit: Payer: Medicare Other | Admitting: Nurse Practitioner

## 2017-10-06 ENCOUNTER — Other Ambulatory Visit: Payer: Self-pay

## 2017-10-06 ENCOUNTER — Ambulatory Visit: Payer: Medicare Other | Admitting: Nurse Practitioner

## 2017-10-06 ENCOUNTER — Encounter: Payer: Self-pay | Admitting: Nurse Practitioner

## 2017-10-06 ENCOUNTER — Ambulatory Visit (INDEPENDENT_AMBULATORY_CARE_PROVIDER_SITE_OTHER): Payer: Medicare Other | Admitting: Nurse Practitioner

## 2017-10-06 VITALS — BP 160/71 | HR 109 | Temp 97.6°F | Ht 62.0 in | Wt 155.0 lb

## 2017-10-06 DIAGNOSIS — R600 Localized edema: Secondary | ICD-10-CM

## 2017-10-06 DIAGNOSIS — M170 Bilateral primary osteoarthritis of knee: Secondary | ICD-10-CM

## 2017-10-06 DIAGNOSIS — H3530 Unspecified macular degeneration: Secondary | ICD-10-CM | POA: Diagnosis not present

## 2017-10-06 DIAGNOSIS — S00451A Superficial foreign body of right ear, initial encounter: Secondary | ICD-10-CM

## 2017-10-06 DIAGNOSIS — E119 Type 2 diabetes mellitus without complications: Secondary | ICD-10-CM

## 2017-10-06 DIAGNOSIS — I420 Dilated cardiomyopathy: Secondary | ICD-10-CM | POA: Diagnosis not present

## 2017-10-06 DIAGNOSIS — I10 Essential (primary) hypertension: Secondary | ICD-10-CM | POA: Diagnosis not present

## 2017-10-06 DIAGNOSIS — F321 Major depressive disorder, single episode, moderate: Secondary | ICD-10-CM | POA: Diagnosis not present

## 2017-10-06 LAB — COMPLETE METABOLIC PANEL WITH GFR
AG Ratio: 1.3 (calc) (ref 1.0–2.5)
ALT: 14 U/L (ref 6–29)
AST: 14 U/L (ref 10–35)
Albumin: 4.1 g/dL (ref 3.6–5.1)
Alkaline phosphatase (APISO): 92 U/L (ref 33–130)
BUN/Creatinine Ratio: 21 (calc) (ref 6–22)
BUN: 24 mg/dL (ref 7–25)
CO2: 24 mmol/L (ref 20–32)
Calcium: 10 mg/dL (ref 8.6–10.4)
Chloride: 109 mmol/L (ref 98–110)
Creat: 1.12 mg/dL — ABNORMAL HIGH (ref 0.60–0.88)
GFR, Est African American: 52 mL/min/{1.73_m2} — ABNORMAL LOW (ref >=60)
GFR, Est Non African American: 45 mL/min/{1.73_m2} — ABNORMAL LOW (ref >=60)
Globulin: 3.1 g/dL (calc) (ref 1.9–3.7)
Glucose, Bld: 133 mg/dL — ABNORMAL HIGH (ref 65–99)
Potassium: 3.8 mmol/L (ref 3.5–5.3)
Sodium: 143 mmol/L (ref 135–146)
Total Bilirubin: 0.4 mg/dL (ref 0.2–1.2)
Total Protein: 7.2 g/dL (ref 6.1–8.1)

## 2017-10-06 LAB — LIPID PANEL
Cholesterol: 198 mg/dL (ref <200)
HDL: 45 mg/dL — ABNORMAL LOW (ref >50)
LDL Cholesterol (Calc): 126 mg/dL (calc) — ABNORMAL HIGH
Non-HDL Cholesterol (Calc): 153 mg/dL (calc) — ABNORMAL HIGH (ref <130)
Total CHOL/HDL Ratio: 4.4 (calc) (ref <5.0)
Triglycerides: 155 mg/dL — ABNORMAL HIGH (ref <150)

## 2017-10-06 LAB — POCT GLYCOSYLATED HEMOGLOBIN (HGB A1C): Hemoglobin A1C: 5.8

## 2017-10-06 MED ORDER — METOPROLOL TARTRATE 50 MG PO TABS: 50.0000 mg | ORAL_TABLET | Freq: Two times a day (BID) | ORAL | 3 refills | Status: DC

## 2017-10-06 MED ORDER — FUROSEMIDE 40 MG PO TABS: 40.0000 mg | ORAL_TABLET | Freq: Every day | ORAL | 3 refills | Status: DC

## 2017-10-06 MED ORDER — CITALOPRAM HYDROBROMIDE 20 MG PO TABS: 20.0000 mg | ORAL_TABLET | Freq: Every day | ORAL | 3 refills | Status: DC

## 2017-10-06 MED ORDER — NIFEDIPINE ER 90 MG PO TB24: 90.0000 mg | ORAL_TABLET | Freq: Every day | ORAL | 3 refills | Status: DC

## 2017-10-06 MED ORDER — POTASSIUM CHLORIDE CRYS ER 20 MEQ PO TBCR: 20.0000 meq | EXTENDED_RELEASE_TABLET | Freq: Every day | ORAL | 3 refills | Status: DC

## 2017-10-06 MED ORDER — LISINOPRIL 40 MG PO TABS: 40.0000 mg | ORAL_TABLET | Freq: Every day | ORAL | 3 refills | Status: DC

## 2017-10-06 MED ORDER — DICLOFENAC SODIUM 1 % TD GEL: 2.0000 g | Freq: Four times a day (QID) | TRANSDERMAL | 11 refills | Status: DC

## 2017-10-06 NOTE — Assessment & Plan Note (Signed)
BP elevated today in clinic, but has not taken medications today.  Home BP readings in goal range per pt recall.  No written readings to review.  Plan: 1. Continue to watch your salt intake. 2. Continue furosemide, lisinopril, nifedipine, and metoprolol w/o changes. 3. Reviewed BP goal < 140/90 (higher goal permitted r/t frail elderly woman who may not tolerate lower in clinic BP). 4. Follow up 6 months or sooner if needed.

## 2017-10-06 NOTE — Assessment & Plan Note (Signed)
Diabetes is still well controlled.  Hgb A1c down to 5.8% from 6.1% 3 months ago. Foot exam performed today: pt with claw toe deformity Right foot and neuropathy with decreased monofilament and vibratory sensation in bilateral feet.  Pt is candidate for therapeutic shoes.   Plan: 1. Continue lifestyle management w/ focus on reducing carbohydrates and increasing vegetables and proteins to prevent weight loss from reducing carb intake. 2. Seek fitting for therapeutic shoes to wear within pt's apartment daily and all the time from [facility] medical supply store. 3. Follow up 6 months since A1c mostly stable off medications.

## 2017-10-06 NOTE — Patient Instructions (Addendum)
Carolyn Hernandez,   Thank you for coming in to clinic today.  1. [facility] [place], [place] [address]  [phone]  [place], [place] [address] [phone] - Diabetic shoes, home fitting as needed at [facility].  - Get a shoe to wear at home.  2. ENT referral for removing cotton in Right ear. [address]  [place],  [address] Ph: [phone]  3. Great work with diet and exercise!  Your diabetes is very well controlled.  Please schedule a follow-up appointment with [name], AGNP. Return in about 6 months (around 04/07/2018) for diabetes.  If you have any other questions or concerns, please feel free to call the clinic or send a message through [place]. You may also schedule an earlier appointment if necessary.  You will receive a survey after today's visit either digitally by e-mail or paper by [employer]. Your experiences and feedback matter to [place].  Please respond so we know how we are doing as we provide care for you.   [name], DNP, AGNP-BC Adult Gerontology Nurse Practitioner [place]

## 2017-10-06 NOTE — Assessment & Plan Note (Signed)
Currently stable and well managed with citalopram 20 mg once daily.  Refill provided. PHQ negative.

## 2017-10-06 NOTE — Assessment & Plan Note (Signed)
+  1 pitting pedal edema remains, but is unchanged.  Pt is taking furosemide daily.  Plan: 1. Continue medications furosemide and potassium as ordered.  2. CMP today. 3. Follow up as needed.

## 2017-10-06 NOTE — Assessment & Plan Note (Signed)
Pt reports macular degeneration of bilateral eyes.  She has had annual eye exam, but report was not received.  Requested pt to have report sent from [facility] to [facility].

## 2017-10-06 NOTE — Assessment & Plan Note (Signed)
Pt continues to use voltaren gel prn for bilateral knee pain.  Pain limits ambulation.  Pt requests refill today.  Refill provided.

## 2017-10-06 NOTE — Assessment & Plan Note (Signed)
No evaluation since 1990 approximately per [facility] in [place].  Pt reports stable symptoms with limitation to ambulation 2/2 knee osteoarthritis.   - Discussed need for echo, declines at this time given stable symptoms.   - Check CMP, lipid.

## 2017-10-06 NOTE — Progress Notes (Signed)
Subjective:    Patient ID: Carolyn Hernandez, female    DOB: 1929/12/31, 84 y.o.   MRN: [id number]  Carolyn Hernandez is a 84 y.o. female presenting on 10/06/2017 for Diabetes   HPI Diabetes Pt presents today for follow up of Type 2 diabetes mellitus. She is not checking CBG at home - Current diabetic medications include: none - She is not currently symptomatic.  - She denies polydipsia, polyphagia, polyuria, headaches, diaphoresis, shakiness, chills, pain, numbness or tingling in extremities and changes in vision.   - Clinical course has been stable. - She  reports no regular exercise routine, but stays regularly active by walking up and down her apartment hallway daily 1-2 times. - Her diet is moderate in salt, moderate in fat, and moderate in carbohydrates. - Weight trend: stable  PREVENTION: Eye exam current (within one year): yes, but records have not been received (last exam at [facility])  --> R eye completely blind, Left eye reduced  Macular degeneration, but pt and family do not know which type Foot exam current (within one year): due today  Lipid/ASCVD risk reduction - on statin: Statin?- no declines 2/2 age - Had taken them several years ago, but  Kidney protection - on ace or arb: lisinopril  Recent Labs    03/10/17 1100 10/06/17 1125  HGBA1C 6.1 5.8   R ear - deaf - new for pt in last approximately 3 months.  Pt reports she is able to hear some if she presses her tragus over ear canal. Non-painful.  Pt reports using Q-tips regularly for cleaning ears.  She has also used some ear drops intermittently without relief.    Depression screen [facility] 2/9 10/06/2017 03/10/2017 09/02/2016 04/01/2016 11/20/2015  Decreased Interest 0 0 0 1 0  Down, Depressed, Hopeless 0 0 0 1 1  PHQ - 2 Score 0 0 0 2 1  Altered sleeping 0 0 - 0 1  Tired, decreased energy 0 0 - 1 3  Change in appetite 0 0 - 0 -  Feeling bad or failure about yourself  0 0 - 0 0  Trouble concentrating 0 0 - 0 0    Moving slowly or fidgety/restless 0 0 - 1 0  Suicidal thoughts 0 0 - 0 0  PHQ-9 Score 0 0 - 4 5  Difficult doing work/chores Not difficult at all Not difficult at all - Not difficult at all -    Social History   Tobacco Use  . Smoking status: Never Smoker  . Smokeless tobacco: Never Used  Substance Use Topics  . Alcohol use: No  . Drug use: No    Review of Systems Per HPI unless specifically indicated above     Objective:    BP (!) 160/71 (BP Location: Right Arm, Patient Position: Sitting, Cuff Size: Large)   Pulse (!) 109   Temp 97.6 F (36.4 C) (Oral)   Ht 5\' 2"  (1.575 m)   Wt 155 lb (70.3 kg)   SpO2 100%   BMI 28.35 kg/m   Wt Readings from Last 3 Encounters:  10/06/17 155 lb (70.3 kg)  03/10/17 169 lb 6.4 oz (76.8 kg)  09/02/16 175 lb (79.4 kg)    Physical Exam  Constitutional: She is oriented to person, place, and time. She appears well-developed and well-nourished. No distress.  HENT:  Right Ear: External ear normal. A foreign body (cotton tip present in canal, obstructs view of TM) is present. Decreased hearing (weber and rinne tests abnormal) is  noted.  Left Ear: Tympanic membrane, external ear and ear canal normal. Decreased hearing (weber and rinne tests normal, still has decreased hearing abilities) is noted.  Neck: Normal range of motion. Neck supple. Carotid bruit is not present.  Cardiovascular: Normal rate, regular rhythm, S1 normal, S2 normal, normal heart sounds and intact distal pulses.  Pulses:      Dorsalis pedis pulses are 1+ on the right side, and 1+ on the left side.       Posterior tibial pulses are 1+ on the right side, and 1+ on the left side.  Pulmonary/Chest: Effort normal and breath sounds normal. No respiratory distress.  Abdominal: Soft. Bowel sounds are normal.  Musculoskeletal: She exhibits no edema (pedal).       Right foot: There is decreased range of motion and deformity (claw toe with 2nd toe crossing over to Great toe).        Left foot: There is decreased range of motion. There is no deformity.  Feet:  Right Foot:  Protective Sensation: 10 sites tested. 0 sites sensed.  Skin Integrity: Negative for ulcer, blister, skin breakdown, erythema, warmth, callus or dry skin.  Left Foot:  Protective Sensation: 10 sites tested. 0 sites sensed.  Skin Integrity: Negative for ulcer, blister, skin breakdown, erythema, warmth, callus or dry skin.  Neurological: She is alert and oriented to person, place, and time. Gait (antalgic, shuffled gait - ambulates with walker) abnormal.  Skin: Skin is warm and dry. Capillary refill takes less than 2 seconds.  Psychiatric: She has a normal mood and affect. Her behavior is normal.  Vitals reviewed.  Diabetic Foot Form - Detailed   Diabetic Foot Exam - detailed Diabetic Foot exam was performed with the following findings:  Yes 10/06/2017 11:30 AM  Visual Foot Exam completed.:  Yes  Can the patient see the bottom of their feet?:  No Are the shoes appropriate in style and fit?:  Yes Is there swelling or and abnormal foot shape?:  No Is there a claw toe deformity?:  Yes Is there elevated skin temparature?:  No Is there foot or ankle muscle weakness?:  No Normal Range of Motion:  No Pulse Foot Exam completed.:  Yes  Right posterior Tibialias:  Present Left posterior Tibialias:  Present  Right Dorsalis Pedis:  Present Left Dorsalis Pedis:  Present  Semmes-Weinstein Monofilament Test R Site 1-Great Toe:  Neg L Site 1-Great Toe:  Neg       Results for orders placed or performed in visit on 10/06/17  POCT glycosylated hemoglobin (Hb A1C)  Result Value Ref Range   Hemoglobin A1C 5.8       Assessment & Plan:   Problem List Items Addressed This Visit      Cardiovascular and Mediastinum   HTN (hypertension)    BP elevated today in clinic, but has not taken medications today.  Home BP readings in goal range per pt recall.  No written readings to review.  Plan: 1. Continue to watch  your salt intake. 2. Continue furosemide, lisinopril, nifedipine, and metoprolol w/o changes. 3. Reviewed BP goal < 140/90 (higher goal permitted r/t frail elderly woman who may not tolerate lower in clinic BP). 4. Follow up 6 months or sooner if needed.      Relevant Medications   metoprolol tartrate (LOPRESSOR) 50 MG tablet   NIFEdipine (ADALAT CC) 90 MG 24 hr tablet   potassium chloride SA (K-DUR,KLOR-CON) 20 MEQ tablet   lisinopril (PRINIVIL,ZESTRIL) 40 MG tablet   furosemide (  LASIX) 40 MG tablet   Other Relevant Orders   Lipid panel   COMPLETE METABOLIC PANEL WITH GFR   Dilated cardiomyopathy ([place])    No evaluation since 1990 approximately per [facility] in [place].  Pt reports stable symptoms with limitation to ambulation 2/2 knee osteoarthritis.   - Discussed need for echo, declines at this time given stable symptoms.   - Check CMP, lipid.      Relevant Medications   metoprolol tartrate (LOPRESSOR) 50 MG tablet   NIFEdipine (ADALAT CC) 90 MG 24 hr tablet   lisinopril (PRINIVIL,ZESTRIL) 40 MG tablet   furosemide (LASIX) 40 MG tablet     Endocrine   Diabetes mellitus type II, controlled, with no complications ([place]) - Primary    Diabetes is still well controlled.  Hgb A1c down to 5.8% from 6.1% 3 months ago. Foot exam performed today: pt with claw toe deformity Right foot and neuropathy with decreased monofilament and vibratory sensation in bilateral feet.  Pt is candidate for therapeutic shoes.   Plan: 1. Continue lifestyle management w/ focus on reducing carbohydrates and increasing vegetables and proteins to prevent weight loss from reducing carb intake. 2. Seek fitting for therapeutic shoes to wear within pt's apartment daily and all the time from [facility] medical supply store. 3. Follow up 6 months since A1c mostly stable off medications.      Relevant Medications   lisinopril (PRINIVIL,ZESTRIL) 40 MG tablet   Other Relevant Orders   POCT glycosylated hemoglobin (Hb  A1C) (Completed)   Lipid panel   COMPLETE METABOLIC PANEL WITH GFR     Musculoskeletal and Integument   Primary osteoarthritis of both knees    Pt continues to use voltaren gel prn for bilateral knee pain.  Pain limits ambulation.  Pt requests refill today.  Refill provided.      Relevant Medications   diclofenac sodium (VOLTAREN) 1 % GEL   Other Relevant Orders   Lipid panel   COMPLETE METABOLIC PANEL WITH GFR     Other   Pedal edema    +1 pitting pedal edema remains, but is unchanged.  Pt is taking furosemide daily.  Plan: 1. Continue medications furosemide and potassium as ordered.  2. CMP today. 3. Follow up as needed.      Relevant Medications   furosemide (LASIX) 40 MG tablet   Other Relevant Orders   Lipid panel   COMPLETE METABOLIC PANEL WITH GFR   Macular degeneration    Pt reports macular degeneration of bilateral eyes.  She has had annual eye exam, but report was not received.  Requested pt to have report sent from [facility] to [facility]      Relevant Orders   Lipid panel   COMPLETE METABOLIC PANEL WITH GFR   Moderate single current episode of major depressive disorder (HCC)    Currently stable and well managed with citalopram 20 mg once daily.  Refill provided. PHQ negative.      Relevant Medications   citalopram (CELEXA) 20 MG tablet   Other Relevant Orders   Lipid panel   COMPLETE METABOLIC PANEL WITH GFR    Other Visit Diagnoses    Non-penetrating foreign body in right ear canal, initial encounter       Pt with decreased R ear hearing with foreing body appearing as cotton swab tip in ear canal.  Unable to retrieve foreign body using curette.  ENT referral made.   Relevant Orders   Ambulatory referral to ENT  Meds ordered this encounter  Medications  . metoprolol tartrate (LOPRESSOR) 50 MG tablet    Sig: Take 1 tablet (50 mg total) by mouth 2 (two) times daily.    Dispense:  180 tablet    Refill:  3    Order Specific Question:   Supervising  Provider    Answer:   Olin Hauser [[id number]]  . NIFEdipine (ADALAT CC) 90 MG 24 hr tablet    Sig: Take 1 tablet (90 mg total) by mouth daily.    Dispense:  90 tablet    Refill:  3    Order Specific Question:   Supervising Provider    Answer:   Olin Hauser [[id number]]  . potassium chloride SA (K-DUR,KLOR-CON) 20 MEQ tablet    Sig: Take 1 tablet (20 mEq total) by mouth daily.    Dispense:  90 tablet    Refill:  3    Order Specific Question:   Supervising Provider    Answer:   Olin Hauser [[id number]]  . lisinopril (PRINIVIL,ZESTRIL) 40 MG tablet    Sig: Take 1 tablet (40 mg total) by mouth daily.    Dispense:  90 tablet    Refill:  3    COULD PATIENT HAVE NEW RX FOR 90 DAY SUPPLY? THANKS!    Order Specific Question:   Supervising Provider    Answer:   Olin Hauser [[id number]]  . furosemide (LASIX) 40 MG tablet    Sig: Take 1 tablet (40 mg total) by mouth daily.    Dispense:  90 tablet    Refill:  3    Order Specific Question:   Supervising Provider    Answer:   Olin Hauser [[id number]]  . citalopram (CELEXA) 20 MG tablet    Sig: Take 1 tablet (20 mg total) by mouth daily.    Dispense:  90 tablet    Refill:  3    Order Specific Question:   Supervising Provider    Answer:   Olin Hauser [[id number]]  . diclofenac sodium (VOLTAREN) 1 % GEL    Sig: Apply 2 g topically 4 (four) times daily.    Dispense:  100 g    Refill:  11    Order Specific Question:   Supervising Provider    Answer:   Olin Hauser [[id number]]    Follow up plan: Return in about 6 months (around 04/07/2018) for diabetes.  [name], DNP, AGPCNP-BC Adult Gerontology Primary Care Nurse Practitioner [facility] 10/06/2017, 5:15 PM

## 2017-10-09 DIAGNOSIS — T161XXA Foreign body in right ear, initial encounter: Secondary | ICD-10-CM | POA: Diagnosis not present

## 2017-12-22 ENCOUNTER — Encounter: Payer: Self-pay | Admitting: Emergency Medicine

## 2017-12-22 ENCOUNTER — Emergency Department: Payer: Medicare Other

## 2017-12-22 ENCOUNTER — Inpatient Hospital Stay
Admission: EM | Admit: 2017-12-22 | Discharge: 2017-12-25 | DRG: 683 | Disposition: A | Discharge status: Home Health | Payer: Medicare Other | Attending: Internal Medicine | Admitting: Internal Medicine

## 2017-12-22 DIAGNOSIS — Z90710 Acquired absence of both cervix and uterus: Secondary | ICD-10-CM

## 2017-12-22 DIAGNOSIS — Z96649 Presence of unspecified artificial hip joint: Secondary | ICD-10-CM | POA: Diagnosis not present

## 2017-12-22 DIAGNOSIS — Z8249 Family history of ischemic heart disease and other diseases of the circulatory system: Secondary | ICD-10-CM | POA: Diagnosis not present

## 2017-12-22 DIAGNOSIS — M170 Bilateral primary osteoarthritis of knee: Secondary | ICD-10-CM | POA: Diagnosis present

## 2017-12-22 DIAGNOSIS — E119 Type 2 diabetes mellitus without complications: Secondary | ICD-10-CM | POA: Diagnosis not present

## 2017-12-22 DIAGNOSIS — M1990 Unspecified osteoarthritis, unspecified site: Secondary | ICD-10-CM | POA: Diagnosis present

## 2017-12-22 DIAGNOSIS — R946 Abnormal results of thyroid function studies: Secondary | ICD-10-CM | POA: Diagnosis not present

## 2017-12-22 DIAGNOSIS — N3000 Acute cystitis without hematuria: Secondary | ICD-10-CM

## 2017-12-22 DIAGNOSIS — R42 Dizziness and giddiness: Secondary | ICD-10-CM | POA: Diagnosis not present

## 2017-12-22 DIAGNOSIS — R197 Diarrhea, unspecified: Secondary | ICD-10-CM | POA: Diagnosis not present

## 2017-12-22 DIAGNOSIS — Z811 Family history of alcohol abuse and dependence: Secondary | ICD-10-CM

## 2017-12-22 DIAGNOSIS — N281 Cyst of kidney, acquired: Secondary | ICD-10-CM | POA: Diagnosis not present

## 2017-12-22 DIAGNOSIS — N390 Urinary tract infection, site not specified: Secondary | ICD-10-CM | POA: Diagnosis not present

## 2017-12-22 DIAGNOSIS — I951 Orthostatic hypotension: Secondary | ICD-10-CM | POA: Diagnosis present

## 2017-12-22 DIAGNOSIS — Z833 Family history of diabetes mellitus: Secondary | ICD-10-CM

## 2017-12-22 DIAGNOSIS — I248 Other forms of acute ischemic heart disease: Secondary | ICD-10-CM | POA: Diagnosis present

## 2017-12-22 DIAGNOSIS — E0781 Sick-euthyroid syndrome: Secondary | ICD-10-CM | POA: Diagnosis not present

## 2017-12-22 DIAGNOSIS — I420 Dilated cardiomyopathy: Secondary | ICD-10-CM | POA: Diagnosis present

## 2017-12-22 DIAGNOSIS — I10 Essential (primary) hypertension: Secondary | ICD-10-CM | POA: Diagnosis present

## 2017-12-22 DIAGNOSIS — H209 Unspecified iridocyclitis: Secondary | ICD-10-CM | POA: Diagnosis not present

## 2017-12-22 DIAGNOSIS — E860 Dehydration: Secondary | ICD-10-CM | POA: Diagnosis not present

## 2017-12-22 DIAGNOSIS — N179 Acute kidney failure, unspecified: Secondary | ICD-10-CM | POA: Diagnosis not present

## 2017-12-22 DIAGNOSIS — F329 Major depressive disorder, single episode, unspecified: Secondary | ICD-10-CM | POA: Diagnosis present

## 2017-12-22 DIAGNOSIS — F0390 Unspecified dementia without behavioral disturbance: Secondary | ICD-10-CM | POA: Diagnosis present

## 2017-12-22 LAB — BASIC METABOLIC PANEL
Anion gap: 16 — ABNORMAL HIGH (ref 5–15)
BUN: 23 mg/dL (ref 8–23)
CHLORIDE: 106 mmol/L (ref 98–111)
CO2: 17 mmol/L — AB (ref 22–32)
CREATININE: 1.22 mg/dL — AB (ref 0.44–1.00)
Calcium: 9.9 mg/dL (ref 8.9–10.3)
GFR calc Af Amer: 45 mL/min — ABNORMAL LOW (ref >60)
GFR calc non Af Amer: 39 mL/min — ABNORMAL LOW (ref >60)
Glucose, Bld: 110 mg/dL — ABNORMAL HIGH (ref 70–99)
Potassium: 3.4 mmol/L — ABNORMAL LOW (ref 3.5–5.1)
Sodium: 139 mmol/L (ref 135–145)

## 2017-12-22 LAB — TSH: TSH: 0.013 u[IU]/mL — AB (ref 0.350–4.500)

## 2017-12-22 LAB — CBC
HCT: 38.8 % (ref 35.0–47.0)
Hemoglobin: 13.7 g/dL (ref 12.0–16.0)
MCH: 31.9 pg (ref 26.0–34.0)
MCHC: 35.3 g/dL (ref 32.0–36.0)
MCV: 90.5 fL (ref 80.0–100.0)
PLATELETS: 365 10*3/uL (ref 150–440)
RBC: 4.29 MIL/uL (ref 3.80–5.20)
RDW: 13.5 % (ref 11.5–14.5)
WBC: 14.8 10*3/uL — ABNORMAL HIGH (ref 3.6–11.0)

## 2017-12-22 LAB — HEPATIC FUNCTION PANEL
ALBUMIN: 4 g/dL (ref 3.5–5.0)
ALK PHOS: 89 U/L (ref 38–126)
ALT: 18 U/L (ref 0–44)
AST: 33 U/L (ref 15–41)
Bilirubin, Direct: 0.2 mg/dL (ref 0.0–0.2)
Indirect Bilirubin: 0.5 mg/dL (ref 0.3–0.9)
TOTAL PROTEIN: 7.7 g/dL (ref 6.5–8.1)
Total Bilirubin: 0.7 mg/dL (ref 0.3–1.2)

## 2017-12-22 LAB — URINALYSIS, COMPLETE (UACMP) WITH MICROSCOPIC
Bacteria, UA: NONE SEEN
Bilirubin Urine: NEGATIVE
GLUCOSE, UA: NEGATIVE mg/dL
Ketones, ur: 5 mg/dL — AB
Nitrite: NEGATIVE
PROTEIN: 100 mg/dL — AB
Specific Gravity, Urine: 1.008 (ref 1.005–1.030)
pH: 6 (ref 5.0–8.0)

## 2017-12-22 LAB — LIPASE, BLOOD: LIPASE: 47 U/L (ref 11–51)

## 2017-12-22 LAB — TROPONIN I: Troponin I: 0.03 ng/mL (ref <0.03)

## 2017-12-22 MED ORDER — SODIUM CHLORIDE 0.9 % IV BOLUS
1000.0000 mL | Freq: Once | INTRAVENOUS | Status: AC
  Administered 2017-12-22: 1000 mL via INTRAVENOUS

## 2017-12-22 MED ORDER — SULFAMETHOXAZOLE-TRIMETHOPRIM 800-160 MG PO TABS
1.0000 | ORAL_TABLET | Freq: Once | ORAL | Status: AC
  Administered 2017-12-22: 1 via ORAL

## 2017-12-22 MED ORDER — SODIUM CHLORIDE 0.9 % IV BOLUS
500.0000 mL | Freq: Once | INTRAVENOUS | Status: AC
  Administered 2017-12-22: 500 mL via INTRAVENOUS

## 2017-12-22 MED ORDER — IOHEXOL 300 MG/ML  SOLN
75.0000 mL | Freq: Once | INTRAMUSCULAR | Status: AC | PRN
  Administered 2017-12-22: 75 mL via INTRAVENOUS

## 2017-12-22 MED ORDER — METOPROLOL TARTRATE 50 MG PO TABS
50.0000 mg | ORAL_TABLET | Freq: Once | ORAL | Status: AC
  Administered 2017-12-22: 50 mg via ORAL
  Filled 2017-12-22: qty 1

## 2017-12-22 MED ORDER — SULFAMETHOXAZOLE-TRIMETHOPRIM 800-160 MG PO TABS: 1.0000 | ORAL_TABLET | Freq: Two times a day (BID) | ORAL | 0 refills | Status: DC

## 2017-12-22 NOTE — ED Notes (Signed)
Pt returned from X-ray.  

## 2017-12-22 NOTE — ED Notes (Signed)
Nurse in to update family. Nurse apologized for the wait. Nurse provided pt with some water at this time. Pt's daughters stating pt "gets anxious" at times and ends "in a tizzy." Pt stating "I'm tired."

## 2017-12-22 NOTE — ED Notes (Signed)
Pt is on monitor and in A-fib which is suspected to be new. Pt stating sx started two days ago but came and went but today the sx were consistent with no relief. Pt is flushed and mildly diaphoretic at this time. Pt denying any pain.

## 2017-12-22 NOTE — ED Triage Notes (Signed)
Pt stating she has been feeling dizzy. Pt called [facility] to be seen. Pt in A-fib/RVR. Pt has edema in BLE. Pt is flushed on arrival and diaphoretic. Pt denying CP or dizziness at this time

## 2017-12-22 NOTE — ED Provider Notes (Addendum)
[facility] Emergency Department Provider Note  ____________________________________________  Time seen: Approximately 8:49 PM  I have reviewed the triage vital signs and the nursing notes.   HISTORY  Chief Complaint Dizziness    HPI Carolyn Hernandez is a 84 y.o. female with a history of HTN, dilated cardiomyopathy, DM presenting for dizziness.  The patient is a difficult historian; she is accompanied by her granddaughters.  The patient reports that she had loose nonbloody diarrhea for 3 days last week and that this has resolved.  She has not been on any antibiotics and has not had any associated nausea or vomiting.  She does report diffuse abdominal pain.  No dysuria.  Today, the patient reports that she had a lightheaded sensation when she stood up, which was associated with a central chest tightness without any palpitations, syncope, diaphoresis, nausea or vomiting.  Past Medical History:  Diagnosis Date  . Arthritis   . Depression   . Hypertension     Patient Active Problem List   Diagnosis Date Noted  . Macular degeneration 10/06/2017  . Moderate single current episode of major depressive disorder ([place]) 10/06/2017  . Murmur, heart 09/02/2016  . Pedal edema 11/20/2015  . Dilated cardiomyopathy ([place]) 08/28/2015  . Primary osteoarthritis of both knees 01/27/2015  . Diabetes mellitus type II, controlled, with no complications ([place]) 12/15/1599  . Low TSH level 01/02/2015  . HTN (hypertension) 04/04/2014    Past Surgical History:  Procedure Laterality Date  . ABDOMINAL HYSTERECTOMY    . APPENDECTOMY    . TOTAL HIP ARTHROPLASTY      Current Outpatient Rx  . Order #: [id number] Class: Normal  . Order #: [id number] Class: Normal  . Order #: [id number] Class: Normal  . Order #: [id number] Class: Normal  . Order #: [id number] Class: Normal  . Order #: [id number] Class: Normal  . Order #: [id number] Class: Normal  . Order #: [id number] Class: Print     Allergies Patient has no known allergies.  Family History  Problem Relation Age of Onset  . Liver disease Son   . Alcohol abuse Son   . Diabetes Daughter   . Heart disease Son     Social History Social History   Tobacco Use  . Smoking status: Never Smoker  . Smokeless tobacco: Never Used  Substance Use Topics  . Alcohol use: No  . Drug use: No    Review of Systems Constitutional: No fever/chills. + Lightheadedness.  No syncope. Eyes: No visual changes.  No blurred or double vision. ENT: No sore throat. No congestion or rhinorrhea. Cardiovascular: Positive central chest tightness. Denies palpitations. Respiratory: Denies shortness of breath.  No cough. Gastrointestinal: He is nonfocal abdominal pain.  No nausea, no vomiting.  No change in appetite.  Positive diarrhea.  No constipation. Genitourinary: Negative for dysuria.  Urinary frequency.  Positive pain related to hemorrhoids which is chronic. Musculoskeletal: Negative for back pain. Skin: Negative for rash. Neurological: Negative for headaches. No focal numbness, tingling or weakness.     ____________________________________________   PHYSICAL EXAM:  VITAL SIGNS: ED Triage Vitals  Enc Vitals Group     BP 12/22/17 1755 (!) 174/96     Pulse Rate 12/22/17 1755 (!) 113     Resp 12/22/17 1755 20     Temp 12/22/17 1755 98 F (36.7 C)     Temp src --      SpO2 12/22/17 1748 99 %     Weight 12/22/17 1756 130 lb (59 kg)     Height 12/22/17  1756 5\' 1"  (1.549 m)     Head Circumference --      Peak Flow --      Pain Score 12/22/17 1756 0     Pain Loc --      Pain Edu? --      Excl. in [place]? --     Constitutional: Alert and oriented. Answers questions appropriately.  The patient is chronically ill-appearing but nontoxic.  Skin is warm and I rechecked her temperature which is 98.0. Eyes: Conjunctivae are normal.  EOMI. No scleral icterus.  No eye discharge. Head: Atraumatic. Nose: No  congestion/rhinnorhea. Mouth/Throat: Mucous membranes are dry.  Neck: No stridor.  Supple.  No JVD.  No meningismus. Cardiovascular: Fast rate, regular rhythm. No murmurs, rubs or gallops.  Respiratory: Normal respiratory effort.  No accessory muscle use or retractions. Lungs CTAB.  No wheezes, rales or ronchi. Gastrointestinal: Obese. Soft, and mildly distended.  Positive diffuse tenderness to palpation in all quadrants except for the right upper quadrant.  No guarding or rebound.  No peritoneal signs. Musculoskeletal: Positive symmetric nonpitting LE edema. No ttp in the calves or palpable cords.  Negative Homan's sign. Neurologic:  A&Ox3.  Speech is clear.  Face and smile are symmetric.  EOMI.  Moves all extremities well. Skin:  Skin is warm, dry and intact. No rash noted. Psychiatric: Mood and affect are normal. Speech and behavior are normal.  Normal judgement  ____________________________________________   LABS (all labs ordered are listed, but only abnormal results are displayed)  Labs Reviewed  BASIC METABOLIC PANEL - Abnormal; Notable for the following components:      Result Value   Potassium 3.4 (*)    CO2 17 (*)    Glucose, Bld 110 (*)    Creatinine, Ser 1.22 (*)    GFR calc non Af Amer 39 (*)    GFR calc Af Amer 45 (*)    Anion gap 16 (*)    All other components within normal limits  CBC - Abnormal; Notable for the following components:   WBC 14.8 (*)    All other components within normal limits  URINALYSIS, COMPLETE (UACMP) WITH MICROSCOPIC - Abnormal; Notable for the following components:   Color, Urine YELLOW (*)    APPearance CLEAR (*)    Hgb urine dipstick SMALL (*)    Ketones, ur 5 (*)    Protein, ur 100 (*)    Leukocytes, UA LARGE (*)    All other components within normal limits  TROPONIN I - Abnormal; Notable for the following components:   Troponin I 0.03 (*)    All other components within normal limits  TSH - Abnormal; Notable for the following  components:   TSH 0.013 (*)    All other components within normal limits  GASTROINTESTINAL PANEL BY PCR, STOOL (REPLACES STOOL CULTURE)  C DIFFICILE QUICK SCREEN W PCR REFLEX  URINE CULTURE  HEPATIC FUNCTION PANEL  LIPASE, BLOOD   ____________________________________________  EKG  ED ECG REPORT I, Eula Listen, the attending physician, personally viewed and interpreted this ECG.   Date: 12/22/2017  EKG Time: 1751  Rate: 118  Rhythm: sinus tachycardia; + PVC; + RBBB and LAFB  Axis: leftward  Intervals:none  ST&T Change: No STEMI  This EKG is compared to his 09/02/2016, which did not show her right bundle branch block for left ventricular fascicular block.  ____________________________________________  RADIOLOGY  Dg Chest 2 View  Result Date: 12/22/2017 CLINICAL DATA:  Dizziness EXAM: CHEST - 2 VIEW COMPARISON:  01/08/2010 FINDINGS: Borderline cardiomegaly is stable. There is no edema, consolidation, effusion, or pneumothorax. Spondylosis with bridging osteophytes. No acute osseous finding. Osteopenia. Artifact from EKG leads. IMPRESSION: No evidence of active disease. Electronically Signed   By: Monte Fantasia M.D.   On: 12/22/2017 21:18   Ct Abdomen Pelvis W Contrast  Result Date: 12/22/2017 CLINICAL DATA:  Diarrhea EXAM: CT ABDOMEN AND PELVIS WITH CONTRAST TECHNIQUE: Multidetector CT imaging of the abdomen and pelvis was performed using the standard protocol following bolus administration of intravenous contrast. CONTRAST:  34mL OMNIPAQUE IOHEXOL 300 MG/ML  SOLN COMPARISON:  None available FINDINGS: Lower chest: Extensive coronary atherosclerotic calcification. No acute finding. Hepatobiliary: Coarse calcification in the right liver tip, incidental.Full gallbladder without ductal dilatation or visible stone. Pancreas: 5 mm cystic density in the uncinate process, considered incidental given size and simple appearance. Spleen: Unremarkable. Adrenals/Urinary Tract: Negative  adrenals. No hydronephrosis or stone. Bilateral renal cortical thinning. 2.5 cm cyst in the upper pole right kidney. Other cortical low densities are too small for densitometry. Full urinary bladder without acute finding Stomach/Bowel:  No obstruction. No inflammatory changes. Vascular/Lymphatic: No acute vascular abnormality. Atherosclerotic calcification. Large IMA and left colic artery, likely compensation for severe atheromatous narrowing of the proximal SMA. No mass or adenopathy. Reproductive:Hysterectomy. Other: No ascites or pneumoperitoneum. Musculoskeletal: No acute abnormalities. Advanced disc and facet degeneration with levoscoliosis. Bilateral hip osteoarthritis, particularly severe on the right. Prior left proximal femur fracture and repair. Osteopenia. IMPRESSION: No acute finding.  No evidence of colitis. Electronically Signed   By: Monte Fantasia M.D.   On: 12/22/2017 21:16    ____________________________________________   PROCEDURES  Procedure(s) performed: None  Procedures  Critical Care performed: No ____________________________________________   INITIAL IMPRESSION / ASSESSMENT AND PLAN / ED COURSE  Pertinent labs & imaging results that were available during my care of the patient were reviewed by me and considered in my medical decision making (see chart for details).  84 y.o. female with a history of dilated cardiomyopathy, HTN, presenting for 3 days of diarrhea, now resolved, diffuse abdominal pain, lightheadedness.  Overall, the patient does have a sinus tachycardia with dry mucous membranes; is possible that she is simply dehydrated from her diarrhea.  I have ordered stool studies although the patient's diarrhea seems to have resolved.  She will also undergo CT of the abdomen given her diffuse pain to look for any complicating factors.  The patient will receive intravenous fluids.  The patient's laboratory studies are also consistent with decreased liquid intake, with a  creatinine of 1.22 up from her baseline of between 1 and 1.18.  She has a white blood cell count of 14.8 and I am awaiting the results of her urinalysis; this could be from her diarrhea.  She is not anemic.  A TSH she has also been ordered as well as a troponin and liver function tests, lipase.  Plan reevaluation for final disposition.  ----------------------------------------- 11:16 PM on 12/22/2017 -----------------------------------------  The patient's work-up in the emergency department is most consistent with dehydration, likely compounded by her recent diarrhea.  Her CT scan of the abdomen does not show any acute intra-abdominal process.  Her urinalysis does show 21-50 white blood cells, and large leukocytes so although she has no bacteria in the urinalysis, culture has been sent and the patient will be given 3 days of Bactrim.  The patient was orthostatic prior to intravenous fluids, and is now feeling much better.  Her tachycardia has resolved.  At this time,  the patient is safe for discharge home.  I have talked to the patient as well as her family about follow-up instructions and return precautions.  ----------------------------------------- 11:38 PM on 12/22/2017 -----------------------------------------  Unfortunately, at the time of discharge, the patient was not able to tolerate orthostatic vital signs and could not stand.  She also removed her IV and began to "talk out of her head."  She is not able to be discharged in this condition and will be admitted for continued evaluation and treatment.  ____________________________________________  FINAL CLINICAL IMPRESSION(S) / ED DIAGNOSES  Final diagnoses:  Orthostasis  Dehydration  Acute cystitis without hematuria  Diarrhea, unspecified type         NEW MEDICATIONS STARTED DURING THIS VISIT:  New Prescriptions   SULFAMETHOXAZOLE-TRIMETHOPRIM (BACTRIM DS,SEPTRA DS) 800-160 MG TABLET    Take 1 tablet by mouth 2 (two) times  daily.      Eula Listen, MD 12/22/17 2318    Eula Listen, MD 12/22/17 [phone]

## 2017-12-22 NOTE — Discharge Instructions (Addendum)
Please take a bland diet to help with your diarrhea and drink plenty of fluid to stay well hydrated.  To the emergency department if you develop lightheadedness or fainting, fever, pain, inability to keep down fluids, or any other symptoms concerning to you.

## 2017-12-23 ENCOUNTER — Other Ambulatory Visit: Payer: Self-pay

## 2017-12-23 DIAGNOSIS — I420 Dilated cardiomyopathy: Secondary | ICD-10-CM | POA: Diagnosis present

## 2017-12-23 DIAGNOSIS — M170 Bilateral primary osteoarthritis of knee: Secondary | ICD-10-CM | POA: Diagnosis present

## 2017-12-23 DIAGNOSIS — I951 Orthostatic hypotension: Secondary | ICD-10-CM | POA: Diagnosis present

## 2017-12-23 DIAGNOSIS — Z811 Family history of alcohol abuse and dependence: Secondary | ICD-10-CM | POA: Diagnosis not present

## 2017-12-23 DIAGNOSIS — F0390 Unspecified dementia without behavioral disturbance: Secondary | ICD-10-CM | POA: Diagnosis present

## 2017-12-23 DIAGNOSIS — N3000 Acute cystitis without hematuria: Secondary | ICD-10-CM | POA: Diagnosis present

## 2017-12-23 DIAGNOSIS — N179 Acute kidney failure, unspecified: Secondary | ICD-10-CM | POA: Diagnosis present

## 2017-12-23 DIAGNOSIS — E0781 Sick-euthyroid syndrome: Secondary | ICD-10-CM | POA: Diagnosis present

## 2017-12-23 DIAGNOSIS — Z96649 Presence of unspecified artificial hip joint: Secondary | ICD-10-CM | POA: Diagnosis present

## 2017-12-23 DIAGNOSIS — I248 Other forms of acute ischemic heart disease: Secondary | ICD-10-CM | POA: Diagnosis present

## 2017-12-23 DIAGNOSIS — Z8249 Family history of ischemic heart disease and other diseases of the circulatory system: Secondary | ICD-10-CM | POA: Diagnosis not present

## 2017-12-23 DIAGNOSIS — F329 Major depressive disorder, single episode, unspecified: Secondary | ICD-10-CM | POA: Diagnosis present

## 2017-12-23 DIAGNOSIS — M1990 Unspecified osteoarthritis, unspecified site: Secondary | ICD-10-CM | POA: Diagnosis present

## 2017-12-23 DIAGNOSIS — E119 Type 2 diabetes mellitus without complications: Secondary | ICD-10-CM | POA: Diagnosis present

## 2017-12-23 DIAGNOSIS — I10 Essential (primary) hypertension: Secondary | ICD-10-CM | POA: Diagnosis present

## 2017-12-23 DIAGNOSIS — Z90710 Acquired absence of both cervix and uterus: Secondary | ICD-10-CM | POA: Diagnosis not present

## 2017-12-23 DIAGNOSIS — Z833 Family history of diabetes mellitus: Secondary | ICD-10-CM | POA: Diagnosis not present

## 2017-12-23 DIAGNOSIS — E860 Dehydration: Secondary | ICD-10-CM | POA: Diagnosis present

## 2017-12-23 LAB — CBC
HCT: 38.3 % (ref 35.0–47.0)
Hemoglobin: 12.8 g/dL (ref 12.0–16.0)
MCH: 30.8 pg (ref 26.0–34.0)
MCHC: 33.5 g/dL (ref 32.0–36.0)
MCV: 91.9 fL (ref 80.0–100.0)
Platelets: 342 10*3/uL (ref 150–440)
RBC: 4.16 MIL/uL (ref 3.80–5.20)
RDW: 13.4 % (ref 11.5–14.5)
WBC: 12.4 10*3/uL — AB (ref 3.6–11.0)

## 2017-12-23 LAB — BASIC METABOLIC PANEL
Anion gap: 8 (ref 5–15)
BUN: 16 mg/dL (ref 8–23)
CO2: 22 mmol/L (ref 22–32)
Calcium: 9 mg/dL (ref 8.9–10.3)
Chloride: 114 mmol/L — ABNORMAL HIGH (ref 98–111)
Creatinine, Ser: 0.96 mg/dL (ref 0.44–1.00)
GFR calc Af Amer: >60 mL/min (ref >60)
GFR, EST NON AFRICAN AMERICAN: 52 mL/min — AB (ref >60)
GLUCOSE: 127 mg/dL — AB (ref 70–99)
Potassium: 3.4 mmol/L — ABNORMAL LOW (ref 3.5–5.1)
SODIUM: 144 mmol/L (ref 135–145)

## 2017-12-23 LAB — TROPONIN I
TROPONIN I: 0.06 ng/mL — AB (ref <0.03)
Troponin I: 0.05 ng/mL (ref <0.03)

## 2017-12-23 LAB — MRSA PCR SCREENING: MRSA by PCR: NEGATIVE

## 2017-12-23 LAB — T4, FREE: Free T4: 0.91 ng/dL (ref 0.82–1.77)

## 2017-12-23 LAB — GLUCOSE, CAPILLARY: Glucose-Capillary: 115 mg/dL — ABNORMAL HIGH (ref 70–99)

## 2017-12-23 MED ORDER — BISACODYL 5 MG PO TBEC: 5.0000 mg | DELAYED_RELEASE_TABLET | Freq: Every day | ORAL | Status: DC | PRN

## 2017-12-23 MED ORDER — HYDROCODONE-ACETAMINOPHEN 5-325 MG PO TABS
1.0000 | ORAL_TABLET | ORAL | Status: DC | PRN
  Administered 2017-12-23: 1 via ORAL
  Filled 2017-12-23: qty 1

## 2017-12-23 MED ORDER — TRAZODONE HCL 50 MG PO TABS
25.0000 mg | ORAL_TABLET | Freq: Every evening | ORAL | Status: DC | PRN
Start: 2017-12-23 — End: 2017-12-25
  Administered 2017-12-23: 25 mg via ORAL
  Filled 2017-12-23: qty 1

## 2017-12-23 MED ORDER — ASPIRIN EC 81 MG PO TBEC
81.0000 mg | DELAYED_RELEASE_TABLET | Freq: Every day | ORAL | Status: DC
  Administered 2017-12-23 – 2017-12-25 (×3): 81 mg via ORAL
  Filled 2017-12-23 (×3): qty 1

## 2017-12-23 MED ORDER — LISINOPRIL 20 MG PO TABS
40.0000 mg | ORAL_TABLET | Freq: Every day | ORAL | Status: DC
  Administered 2017-12-23 – 2017-12-25 (×3): 40 mg via ORAL
  Filled 2017-12-23 (×3): qty 2

## 2017-12-23 MED ORDER — ACETAMINOPHEN 650 MG RE SUPP: 650.0000 mg | Freq: Four times a day (QID) | RECTAL | Status: DC | PRN

## 2017-12-23 MED ORDER — ONDANSETRON HCL 4 MG/2ML IJ SOLN: 4.0000 mg | Freq: Four times a day (QID) | INTRAMUSCULAR | Status: DC | PRN

## 2017-12-23 MED ORDER — SODIUM CHLORIDE 0.9 % IV SOLN
INTRAVENOUS | Status: DC
  Administered 2017-12-23 (×2): via INTRAVENOUS

## 2017-12-23 MED ORDER — NIFEDIPINE ER OSMOTIC RELEASE 30 MG PO TB24
90.0000 mg | ORAL_TABLET | Freq: Every day | ORAL | Status: DC
  Administered 2017-12-23 – 2017-12-25 (×3): 90 mg via ORAL
  Filled 2017-12-23: qty 1
  Filled 2017-12-23 (×3): qty 3

## 2017-12-23 MED ORDER — METOPROLOL TARTRATE 50 MG PO TABS
50.0000 mg | ORAL_TABLET | Freq: Two times a day (BID) | ORAL | Status: DC
  Administered 2017-12-23 – 2017-12-25 (×5): 50 mg via ORAL
  Filled 2017-12-23 (×5): qty 1

## 2017-12-23 MED ORDER — CITALOPRAM HYDROBROMIDE 20 MG PO TABS
20.0000 mg | ORAL_TABLET | Freq: Every day | ORAL | Status: DC
  Administered 2017-12-23 – 2017-12-25 (×3): 20 mg via ORAL
  Filled 2017-12-23 (×3): qty 1

## 2017-12-23 MED ORDER — POTASSIUM CHLORIDE CRYS ER 20 MEQ PO TBCR
20.0000 meq | EXTENDED_RELEASE_TABLET | Freq: Every day | ORAL | Status: DC
  Administered 2017-12-23 – 2017-12-25 (×3): 20 meq via ORAL
  Filled 2017-12-23 (×3): qty 1

## 2017-12-23 MED ORDER — DOCUSATE SODIUM 100 MG PO CAPS
100.0000 mg | ORAL_CAPSULE | Freq: Two times a day (BID) | ORAL | Status: DC
  Administered 2017-12-23 – 2017-12-25 (×5): 100 mg via ORAL
  Filled 2017-12-23 (×5): qty 1

## 2017-12-23 MED ORDER — ONDANSETRON HCL 4 MG PO TABS: 4.0000 mg | ORAL_TABLET | Freq: Four times a day (QID) | ORAL | Status: DC | PRN

## 2017-12-23 MED ORDER — CEFTRIAXONE SODIUM 1 G IJ SOLR
1.0000 g | INTRAMUSCULAR | Status: DC
  Administered 2017-12-23 – 2017-12-24 (×2): 1 g via INTRAVENOUS
  Filled 2017-12-23 (×2): qty 1

## 2017-12-23 MED ORDER — ACETAMINOPHEN 325 MG PO TABS
650.0000 mg | ORAL_TABLET | Freq: Four times a day (QID) | ORAL | Status: DC | PRN
  Administered 2017-12-25: 650 mg via ORAL
  Filled 2017-12-23: qty 2

## 2017-12-23 MED ORDER — HEPARIN SODIUM (PORCINE) 5000 UNIT/ML IJ SOLN
5000.0000 [IU] | Freq: Three times a day (TID) | INTRAMUSCULAR | Status: DC
  Administered 2017-12-23 – 2017-12-25 (×7): 5000 [IU] via SUBCUTANEOUS
  Filled 2017-12-23 (×6): qty 1

## 2017-12-23 NOTE — H&P (Signed)
[facility] NAME: Carolyn Hernandez    MR#:  [id number]  DATE OF BIRTH:  November 28, 1929  DATE OF ADMISSION:  12/22/2017  PRIMARY CARE PHYSICIAN: Mikey College, NP   REQUESTING/REFERRING PHYSICIAN:   CHIEF COMPLAINT:   Chief Complaint  Patient presents with  . Dizziness    HISTORY OF PRESENT ILLNESS: Carolyn Hernandez  is a 84 y.o. female with a known history of osteoarthritis and hypertension. Patient was brought to emergency room for generalized weakness and lightheadedness, going on for the past 24 hours, gradually getting worse.  Her symptoms are worse with standing up and attempting to ambulate.  She denies any chest pain, shortness of breath, fever, chills.  Patient had an episode of diarrhea few days ago, that lasted 3 to 4 days.  There was no nausea vomiting or bleeding associated.  No new medications, no recent antibiotic use. Blood test done emergency room are notable for potassium level at 3.4, creatinine at 1.22, TSH at 0.013 and troponin level at 0.03.  UA is positive for UTI.  CT scan of the abdomen is unremarkable. Patient is admitted for further evaluation and treatment.    PAST MEDICAL HISTORY:   Past Medical History:  Diagnosis Date  . Arthritis   . Depression   . Hypertension     PAST SURGICAL HISTORY:  Past Surgical History:  Procedure Laterality Date  . ABDOMINAL HYSTERECTOMY    . APPENDECTOMY    . TOTAL HIP ARTHROPLASTY      SOCIAL HISTORY:  Social History   Tobacco Use  . Smoking status: Never Smoker  . Smokeless tobacco: Never Used  Substance Use Topics  . Alcohol use: No    FAMILY HISTORY:  Family History  Problem Relation Age of Onset  . Liver disease Son   . Alcohol abuse Son   . Diabetes Daughter   . Heart disease Son     DRUG ALLERGIES: No Known Allergies  REVIEW OF SYSTEMS:   CONSTITUTIONAL: No fever, but positive for fatigue and generalized weakness.  EYES: No  blurred or double vision.  EARS, NOSE, AND THROAT: No tinnitus or ear pain.  RESPIRATORY: No cough, shortness of breath, wheezing or hemoptysis.  CARDIOVASCULAR: No chest pain, orthopnea, edema.  GASTROINTESTINAL: Positive for diarrhea few days ago.  No nausea, vomiting, or abdominal pain.  GENITOURINARY: No dysuria, hematuria.  ENDOCRINE: No polyuria, nocturia,  HEMATOLOGY: No bleeding SKIN: No rash or lesion. MUSCULOSKELETAL: No joint pain or arthritis.   NEUROLOGIC: No focal weakness.  PSYCHIATRY: No anxiety or depression.   MEDICATIONS AT HOME:  Prior to Admission medications   Medication Sig Start Date End Date Taking? Authorizing Provider  citalopram (CELEXA) 20 MG tablet Take 1 tablet (20 mg total) by mouth daily. 10/06/17  Yes Mikey College, NP  diclofenac sodium (VOLTAREN) 1 % GEL Apply 2 g topically 4 (four) times daily. 10/06/17  Yes Mikey College, NP  furosemide (LASIX) 40 MG tablet Take 1 tablet (40 mg total) by mouth daily. 10/06/17  Yes Mikey College, NP  lisinopril (PRINIVIL,ZESTRIL) 40 MG tablet Take 1 tablet (40 mg total) by mouth daily. 10/06/17  Yes Mikey College, NP  metoprolol tartrate (LOPRESSOR) 50 MG tablet Take 1 tablet (50 mg total) by mouth 2 (two) times daily. 10/06/17  Yes Mikey College, NP  NIFEdipine (ADALAT CC) 90 MG 24 hr tablet Take 1 tablet (90 mg total) by mouth daily. 10/06/17  Yes  Mikey College, NP  potassium chloride SA (K-DUR,KLOR-CON) 20 MEQ tablet Take 1 tablet (20 mEq total) by mouth daily. Patient not taking: Reported on 12/22/2017 10/06/17   Mikey College, NP  sulfamethoxazole-trimethoprim (BACTRIM DS,SEPTRA DS) 800-160 MG tablet Take 1 tablet by mouth 2 (two) times daily. 12/22/17   Eula Listen, MD      PHYSICAL EXAMINATION:   VITAL SIGNS: Blood pressure (!) 177/73, pulse 80, temperature 98.7 F (37.1 C), temperature source Oral, resp. rate (!) 23, height 5\' 1"  (1.549 m), weight 59 kg  (130 lb), SpO2 99 %.  GENERAL:  84 y.o.-year-old patient lying in the bed with no acute distress.  EYES: Pupils equal, round, reactive to light and accommodation. No scleral icterus. Extraocular muscles intact.  HEENT: Head atraumatic, normocephalic. Oropharynx and nasopharynx clear.  NECK:  Supple, no jugular venous distention. No thyroid enlargement, no tenderness.  LUNGS: Normal breath sounds bilaterally, no wheezing, rales,rhonchi or crepitation. No use of accessory muscles of respiration.  CARDIOVASCULAR: S1, S2 normal. No murmurs, rubs, or gallops.  ABDOMEN: Soft, nontender, nondistended. Bowel sounds present. No organomegaly or mass.  EXTREMITIES: No pedal edema, cyanosis, or clubbing.  NEUROLOGIC: Cranial nerves II through XII are intact. Muscle strength 5/5 in all extremities. Sensation intact. Gait is unstable, due to lightheadedness when standing up.  PSYCHIATRIC: The patient is alert and oriented x 3.  SKIN: No obvious rash, lesion, or ulcer.   LABORATORY PANEL:   CBC Recent Labs  Lab 12/22/17 1801  WBC 14.8*  HGB 13.7  HCT 38.8  PLT 365  MCV 90.5  MCH 31.9  MCHC 35.3  RDW 13.5   ------------------------------------------------------------------------------------------------------------------  Chemistries  Recent Labs  Lab 12/22/17 1801  NA 139  K 3.4*  CL 106  CO2 17*  GLUCOSE 110*  BUN 23  CREATININE 1.22*  CALCIUM 9.9  AST 33  ALT 18  ALKPHOS 89  BILITOT 0.7   ------------------------------------------------------------------------------------------------------------------ estimated creatinine clearance is 27.3 mL/min (A) (by C-G formula based on SCr of 1.22 mg/dL (H)). ------------------------------------------------------------------------------------------------------------------ Recent Labs    12/22/17 1801  TSH 0.013*     Coagulation profile No results for input(s): INR, PROTIME in the last 168  hours. ------------------------------------------------------------------------------------------------------------------- No results for input(s): DDIMER in the last 72 hours. -------------------------------------------------------------------------------------------------------------------  Cardiac Enzymes Recent Labs  Lab 12/22/17 1801  TROPONINI 0.03*   ------------------------------------------------------------------------------------------------------------------ Invalid input(s): POCBNP  ---------------------------------------------------------------------------------------------------------------  Urinalysis    Component Value Date/Time   COLORURINE YELLOW (A) 12/22/2017 1801   APPEARANCEUR CLEAR (A) 12/22/2017 1801   LABSPEC 1.008 12/22/2017 1801   PHURINE 6.0 12/22/2017 1801   GLUCOSEU NEGATIVE 12/22/2017 1801   HGBUR SMALL (A) 12/22/2017 1801   BILIRUBINUR NEGATIVE 12/22/2017 1801   KETONESUR 5 (A) 12/22/2017 1801   PROTEINUR 100 (A) 12/22/2017 1801   NITRITE NEGATIVE 12/22/2017 1801   LEUKOCYTESUR LARGE (A) 12/22/2017 1801     RADIOLOGY: Dg Chest 2 View  Result Date: 12/22/2017 CLINICAL DATA:  Dizziness EXAM: CHEST - 2 VIEW COMPARISON:  01/08/2010 FINDINGS: Borderline cardiomegaly is stable. There is no edema, consolidation, effusion, or pneumothorax. Spondylosis with bridging osteophytes. No acute osseous finding. Osteopenia. Artifact from EKG leads. IMPRESSION: No evidence of active disease. Electronically Signed   By: Monte Fantasia M.D.   On: 12/22/2017 21:18   Ct Abdomen Pelvis W Contrast  Result Date: 12/22/2017 CLINICAL DATA:  Diarrhea EXAM: CT ABDOMEN AND PELVIS WITH CONTRAST TECHNIQUE: Multidetector CT imaging of the abdomen and pelvis was performed using the standard protocol following bolus  administration of intravenous contrast. CONTRAST:  20mL OMNIPAQUE IOHEXOL 300 MG/ML  SOLN COMPARISON:  None available FINDINGS: Lower chest: Extensive coronary  atherosclerotic calcification. No acute finding. Hepatobiliary: Coarse calcification in the right liver tip, incidental.Full gallbladder without ductal dilatation or visible stone. Pancreas: 5 mm cystic density in the uncinate process, considered incidental given size and simple appearance. Spleen: Unremarkable. Adrenals/Urinary Tract: Negative adrenals. No hydronephrosis or stone. Bilateral renal cortical thinning. 2.5 cm cyst in the upper pole right kidney. Other cortical low densities are too small for densitometry. Full urinary bladder without acute finding Stomach/Bowel:  No obstruction. No inflammatory changes. Vascular/Lymphatic: No acute vascular abnormality. Atherosclerotic calcification. Large IMA and left colic artery, likely compensation for severe atheromatous narrowing of the proximal SMA. No mass or adenopathy. Reproductive:Hysterectomy. Other: No ascites or pneumoperitoneum. Musculoskeletal: No acute abnormalities. Advanced disc and facet degeneration with levoscoliosis. Bilateral hip osteoarthritis, particularly severe on the right. Prior left proximal femur fracture and repair. Osteopenia. IMPRESSION: No acute finding.  No evidence of colitis. Electronically Signed   By: Monte Fantasia M.D.   On: 12/22/2017 21:16    EKG: Orders placed or performed during the hospital encounter of 12/22/17  . EKG 12-Lead  . EKG 12-Lead    IMPRESSION AND PLAN:  1.  Lightheadedness, likely secondary to orthostatic hypotension due to dehydration from diarrhea.  We will start gentle IV hydration and monitor patient clinically closely. 2.  Acute diarrhea, resolved.  Will check stool test if patient would have diarrhea again.  CT scan of the abdomen is unremarkable.  We will continue supportive treatment with IV fluids and electrolytes replacement. 3. ARF, likely prerenal, secondary to dehydration from acute diarrhea.  We will start gentle IV hydration and monitor kidney function closely.  Avoid nephrotoxic  medications. 4.  UTI, will start Rocephin IV, while waiting for final urine culture result. 5.  Abnormal TSH level, at 0.013.  This could be related to acute illness, or true hyperthyroidism.  Will check free T4 for further evaluation. 6.  Borderline elevated troponin level at 0.03, likely related to acute illness.  We will continue to monitor patient on telemetry and follow troponin levels to rule out ACS. 7.  Generalized weakness and unstable gait.  Will have PT/OT evaluate the patient.  All the records are reviewed and case discussed with ED provider. Management plans discussed with the patient, family and they are in agreement.  CODE STATUS: Full    TOTAL TIME TAKING CARE OF THIS PATIENT: 45 minutes.    Amelia Jo M.D on 12/23/2017 at 12:36 AM  Between 7am to 6pm - Pager - [phone]  After 6pm go to [email or web address] - password [email or web address] [facility]  Office  [phone]  CC: Primary care physician; Mikey College, NP

## 2017-12-23 NOTE — Clinical Social Work Note (Signed)
CSW consulted stating that patient is from an assisted living. CSW has been informed by patient's nurse that patient's family has reported she is from home and not assisted living. Shela Leff MSW,lcSW [phone]

## 2017-12-23 NOTE — Progress Notes (Signed)
[facility] NAME: Carolyn Hernandez    MR#:  [id number]  DATE OF BIRTH:  Apr 03, 1930  SUBJECTIVE:   Patient here due to dizziness, weakness and noted to have a urinary tract infection.  Patient was having some diarrhea prior to come to the hospital but it has resolved.  Patient had a formed stool today.  Patient will DC enteric precautions.  Patient's husband is at bedside.  REVIEW OF SYSTEMS:    Review of Systems  Constitutional: Negative for chills and fever.  HENT: Negative for congestion and tinnitus.   Eyes: Negative for blurred vision and double vision.  Respiratory: Negative for cough, shortness of breath and wheezing.   Cardiovascular: Negative for chest pain, orthopnea and PND.  Gastrointestinal: Negative for abdominal pain, diarrhea, nausea and vomiting.  Genitourinary: Negative for dysuria and hematuria.  Neurological: Positive for weakness (Generalized). Negative for dizziness, sensory change and focal weakness.  All other systems reviewed and are negative.   Nutrition: heart healthy Tolerating Diet: Yes Tolerating PT: Eval noted.      DRUG ALLERGIES:  No Known Allergies  VITALS:  Blood pressure (!) 173/75, pulse 79, temperature 98.3 F (36.8 C), resp. rate 16, height 5\' 1"  (1.549 m), weight 78.9 kg (173 lb 15.1 oz), SpO2 98 %.  PHYSICAL EXAMINATION:   Physical Exam  GENERAL:  84 y.o.-year-old patient lying in bed in no acute distress.  EYES: Pupils equal, round, reactive to light and accommodation. No scleral icterus. Extraocular muscles intact.  HEENT: Head atraumatic, normocephalic. Oropharynx and nasopharynx clear.  NECK:  Supple, no jugular venous distention. No thyroid enlargement, no tenderness.  LUNGS: Normal breath sounds bilaterally, no wheezing, rales, rhonchi. No use of accessory muscles of respiration.  CARDIOVASCULAR: S1, S2 normal. No murmurs, rubs, or gallops.  ABDOMEN: Soft, nontender,  nondistended. Bowel sounds present. No organomegaly or mass.  EXTREMITIES: No cyanosis, clubbing or edema b/l.    NEUROLOGIC: Cranial nerves II through XII are intact. No focal Motor or sensory deficits b/l.  Globally weak.  PSYCHIATRIC: The patient is alert and oriented x 1.  SKIN: No obvious rash, lesion, or ulcer.    LABORATORY PANEL:   CBC Recent Labs  Lab 12/23/17 0455  WBC 12.4*  HGB 12.8  HCT 38.3  PLT 342   ------------------------------------------------------------------------------------------------------------------  Chemistries  Recent Labs  Lab 12/22/17 1801 12/23/17 0455  NA 139 144  K 3.4* 3.4*  CL 106 114*  CO2 17* 22  GLUCOSE 110* 127*  BUN 23 16  CREATININE 1.22* 0.96  CALCIUM 9.9 9.0  AST 33  --   ALT 18  --   ALKPHOS 89  --   BILITOT 0.7  --    ------------------------------------------------------------------------------------------------------------------  Cardiac Enzymes Recent Labs  Lab 12/23/17 0902  TROPONINI 0.05*   ------------------------------------------------------------------------------------------------------------------  RADIOLOGY:  Dg Chest 2 View  Result Date: 12/22/2017 CLINICAL DATA:  Dizziness EXAM: CHEST - 2 VIEW COMPARISON:  01/08/2010 FINDINGS: Borderline cardiomegaly is stable. There is no edema, consolidation, effusion, or pneumothorax. Spondylosis with bridging osteophytes. No acute osseous finding. Osteopenia. Artifact from EKG leads. IMPRESSION: No evidence of active disease. Electronically Signed   By: Monte Fantasia M.D.   On: 12/22/2017 21:18   Ct Abdomen Pelvis W Contrast  Result Date: 12/22/2017 CLINICAL DATA:  Diarrhea EXAM: CT ABDOMEN AND PELVIS WITH CONTRAST TECHNIQUE: Multidetector CT imaging of the abdomen and pelvis was performed using the standard protocol following bolus administration of intravenous contrast. CONTRAST:  45mL OMNIPAQUE IOHEXOL 300 MG/ML  SOLN COMPARISON:  None available FINDINGS:  Lower chest: Extensive coronary atherosclerotic calcification. No acute finding. Hepatobiliary: Coarse calcification in the right liver tip, incidental.Full gallbladder without ductal dilatation or visible stone. Pancreas: 5 mm cystic density in the uncinate process, considered incidental given size and simple appearance. Spleen: Unremarkable. Adrenals/Urinary Tract: Negative adrenals. No hydronephrosis or stone. Bilateral renal cortical thinning. 2.5 cm cyst in the upper pole right kidney. Other cortical low densities are too small for densitometry. Full urinary bladder without acute finding Stomach/Bowel:  No obstruction. No inflammatory changes. Vascular/Lymphatic: No acute vascular abnormality. Atherosclerotic calcification. Large IMA and left colic artery, likely compensation for severe atheromatous narrowing of the proximal SMA. No mass or adenopathy. Reproductive:Hysterectomy. Other: No ascites or pneumoperitoneum. Musculoskeletal: No acute abnormalities. Advanced disc and facet degeneration with levoscoliosis. Bilateral hip osteoarthritis, particularly severe on the right. Prior left proximal femur fracture and repair. Osteopenia. IMPRESSION: No acute finding.  No evidence of colitis. Electronically Signed   By: Monte Fantasia M.D.   On: 12/22/2017 21:16     ASSESSMENT AND PLAN:   84 year old female with past medical history of hypertension, depression, osteoarthritis, mild dementia who presents to the hospital due to dizziness, diarrhea and noted to have mild acute kidney injury along with a UTI.  1.  Dizziness/lightheadedness-secondary to dehydration from ongoing diarrhea. -Continue IV fluids, will check orthostatic vital signs.  Clinically improved today.  2.  Acute diarrhea-now resolved.  Patient had a formed stool today. -We will DC enteric precautions, no further need for stool studies for comprehensive cultures C. Difficile.  3.  Acute kidney injury-secondary to dehydration from  prerenal azotemia.  Continue gentle IV fluids, creatinine improving.  4.  Urinary tract infection- we will treat the patient with IV Rocephin, follow urine cultures.  5.  Elevated TSH- patient's free T4 is within normal range.  This is likely euthyroid sick syndrome.  Will repeat TSH in few weeks after acute illness resolves.  6.  Elevated troponin-secondary to supply demand ischemia from acute kidney injury, dehydration. -Troponins did not trend upwards.  Will DC telemetry.  7.  Generalized weakness/unsteady gait- appreciate physical therapy evaluation will arrange home health services upon discharge.  8.  Essential hypertension-continue metoprolol, lisinopril.  9.  Depression-continue Celexa.     All the records are reviewed and case discussed with Care Management/Social Worker. Management plans discussed with the patient, family and they are in agreement.  CODE STATUS: Full code  DVT Prophylaxis: Hep SQ  TOTAL TIME TAKING CARE OF THIS PATIENT: 30 minutes.   POSSIBLE D/C IN 1-2 DAYS, DEPENDING ON CLINICAL CONDITION.   Henreitta Leber M.D on 12/23/2017 at 2:46 PM  Between 7am to 6pm - Pager - [phone]  After 6pm go to [email or web address] - Proofreader  [facility]  Office  [phone]  CC: Primary care physician; Mikey College, NP

## 2017-12-23 NOTE — Evaluation (Signed)
Occupational Therapy Evaluation Patient Details Name: Carolyn Hernandez MRN: [id number] DOB: 05-13-1930 Today's Date: 12/23/2017    History of Present Illness 84 y.o. female with a known history of osteoarthritis and HTN admitted from ED due to dizziness, weakness and noted to have a urinary tract infection.   Clinical Impression   Pt seen for OT evaluation this date. Prior to hospital admission, pt was independent with basic ADL, med mgt, and light meal prep, per pt. Pt lives in an [place] apartment, per pt report (family at end of session confirmed) and ambulates with a RW at all times. Currently pt demonstrates impairments in cognition (oriented to self, with time and cues orients to place/date), follows commands well, mildly confused but easily redirected. Pt requiring supervision to SBA for seated LB bathing and dressing tasks and for mobility to maximize safety and minimize falls risk. Due to cognition, pt also requires assist for higher level IADL tasks such as medication mgt and meal prep. Pt would benefit from skilled OT to address noted impairments and functional limitations (see below for any additional details) in order to maximize safety and independence while minimizing falls risk and caregiver burden.  Upon hospital discharge, recommend pt discharge to home with [facility] services to address safety, falls prevention, medication mgt, and meal prep/housekeeping skills with intermittent supervision/assist while pt continues to be mildly confused.     Follow Up Recommendations  Home health OT;Supervision - Intermittent    Equipment Recommendations  Other (comment)(TBD)    Recommendations for Other Services       Precautions / Restrictions Precautions Precautions: Fall Restrictions Weight Bearing Restrictions: No      Mobility Bed Mobility             General bed mobility comments: deferred, up in recliner  Transfers Overall transfer level: Modified independent Equipment  used: Rolling walker (2 wheeled)             General transfer comment: occasional VC for hand placement    Balance Overall balance assessment: Modified Independent                                         ADL either performed or assessed with clinical judgement   ADL Overall ADL's : Needs assistance/impaired Eating/Feeding: Sitting;Independent   Grooming: Sitting;Independent   Upper Body Bathing: Sitting;Supervision/ safety;Set up   Lower Body Bathing: Sit to/from stand;Supervison/ safety   Upper Body Dressing : Sitting;Supervision/safety   Lower Body Dressing: Sit to/from stand;Supervision/safety   Toilet Transfer: RW;Supervision/safety;BSC;Ambulation           Functional mobility during ADLs: Supervision/safety;Rolling walker       Vision Baseline Vision/History: Wears glasses Wears Glasses: Reading only Patient Visual Report: No change from baseline       Perception     Praxis      Pertinent Vitals/Pain Pain Assessment: No/denies pain     Hand Dominance     Extremity/Trunk Assessment Upper Extremity Assessment Upper Extremity Assessment: Overall WFL for tasks assessed;Generalized weakness(WFL for age, at least 4-/5 bilaterally)   Lower Extremity Assessment Lower Extremity Assessment: Overall WFL for tasks assessed;Generalized weakness(WFL for age, at least 4-/5 bilaterally)       Communication Communication Communication: No difficulties   Cognition Arousal/Alertness: Awake/alert Behavior During Therapy: WFL for tasks assessed/performed Overall Cognitive Status: No family/caregiver present to determine baseline cognitive functioning  General Comments: Pt appears to be mildly confused t/o session, oriented to self and with cues can correctly identify and problem solve month/place, unclear why she is here. Follows all commands well.   General Comments       Exercises      Shoulder Instructions      Home Living Family/patient expects to be discharged to:: Private residence(ILF) Living Arrangements: Alone Available Help at Discharge: Family;Available PRN/intermittently Type of Home: Apartment Home Access: Level entry     Home Layout: One level     Bathroom Shower/Tub: Teacher, early years/pre: Standard     Home Equipment: Environmental consultant - 4 wheels          Prior Functioning/Environment Level of Independence: Independent with assistive device(s)        Comments: Pt reports that she gets around fairly well at home with her RW, indep with ADL and light meal prep, indep with med mgt per pt, and receives assist for transportation        OT Problem List: Decreased knowledge of use of DME or AE;Decreased cognition;Decreased activity tolerance;Decreased safety awareness;Impaired balance (sitting and/or standing)      OT Treatment/Interventions: Self-care/ADL training;Balance training;Therapeutic exercise;Therapeutic activities;Energy conservation;DME and/or AE instruction;Patient/family education;Cognitive remediation/compensation    OT Goals(Current goals can be found in the care plan section) Acute Rehab OT Goals Patient Stated Goal: go home OT Goal Formulation: With patient Time For Goal Achievement: 01/06/18 Potential to Achieve Goals: Good ADL Goals Pt Will Perform Lower Body Dressing: with modified independence;sit to/from stand Additional ADL Goal #1: Pt will demonstrate good safety awareness during ADL and mobility tasks requiring no external cueing for safety, 5/5 opportunities. Additional ADL Goal #2: Pt will correct set up all medications as prescribed for 1 week with use of weekly pill organizer with supervision assist and compensatory strategies as needed.  OT Frequency: Min 1X/week   Barriers to D/C:            Co-evaluation              AM-PAC PT "6 Clicks" Daily Activity     Outcome Measure Help from another  person eating meals?: None Help from another person taking care of personal grooming?: None Help from another person toileting, which includes using toliet, bedpan, or urinal?: A Little Help from another person bathing (including washing, rinsing, drying)?: A Little Help from another person to put on and taking off regular upper body clothing?: A Little Help from another person to put on and taking off regular lower body clothing?: A Little 6 Click Score: 20   End of Session    Activity Tolerance: Patient tolerated treatment well Patient left: in chair;with call bell/phone within reach;with chair alarm set;with family/visitor present  OT Visit Diagnosis: Other abnormalities of gait and mobility (R26.89);Other symptoms and signs involving cognitive function;History of falling (Z91.81)                Time: 8182-9937 OT Time Calculation (min): 23 min Charges:  OT General Charges $OT Visit: 1 Visit OT Evaluation $OT Eval Low Complexity: 1 Low  Jeni Salles, MPH, MS, OTR/L ascom [phone] 12/23/17, 5:00 PM

## 2017-12-23 NOTE — Evaluation (Signed)
Physical Therapy Evaluation Patient Details Name: Carolyn Hernandez MRN: [id number] DOB: 03-07-1930 Today's Date: 12/23/2017   History of Present Illness  84 y.o. female with a known history of osteoarthritis and hypertension.  Clinical Impression  Pt did well with mobility and ambulation in the room but did fatigue relatively quickly with the effort.  She had some mild confusion, but did follow instructions and maintain safety well t/o the session.  She was able to confidently do bed mobility and transfers and overall showed good effort with gait training and therapeutic activity training apart from the exam.  Pt feels confident about being able to go home, unsure how much help/assist she does have regularly but at least initially she will likely need some regular supervision until she returns to baseline. .    Follow Up Recommendations Home health PT;Supervision - Intermittent    Equipment Recommendations  None recommended by PT    Recommendations for Other Services       Precautions / Restrictions Precautions Precautions: Fall Restrictions Weight Bearing Restrictions: No      Mobility  Bed Mobility Overal bed mobility: Independent             General bed mobility comments: Pt was able to get to EOB w/o direct assist, she showed good confidence and strength, mild posterior lean but ablet o maintain sitting balance   Transfers Overall transfer level: Modified independent Equipment used: Rolling walker (2 wheeled)             General transfer comment: Needed only light reminders/cuing for hand placement and sequencing, able to rise w/o phyiscal assist  Ambulation/Gait Ambulation/Gait assistance: Supervision Gait Distance (Feet): 35 Feet Assistive device: Rolling walker (2 wheeled)       General Gait Details: Pt was able to ambulate with slow but consistent cadence and no safety issues apart from mild situational/directional awareness.  Pt reports some fatigue,  O2 remained in the 90s on room air HR only slightly elevated from resting.  Stairs            Wheelchair Mobility    Modified Rankin (Stroke Patients Only)       Balance Overall balance assessment: Modified Independent(reliant on walker, but able to ambulate )                                           Pertinent Vitals/Pain Pain Assessment: No/denies pain    Home Living Family/patient expects to be discharged to:: Private residence Living Arrangements: Alone Available Help at Discharge: Family Type of Home: Apartment Home Access: Level entry       Home Equipment: Environmental consultant - 4 wheels      Prior Function Level of Independence: Independent with assistive device(s)         Comments: Pt reports that she gets around fairly well at home     Hand Dominance        Extremity/Trunk Assessment   Upper Extremity Assessment Upper Extremity Assessment: Overall WFL for tasks assessed;Generalized weakness    Lower Extremity Assessment Lower Extremity Assessment: Overall WFL for tasks assessed;Generalized weakness       Communication   Communication: No difficulties(Pt with some general confusion t/o the )  Cognition Arousal/Alertness: Awake/alert Behavior During Therapy: WFL for tasks assessed/performed Overall Cognitive Status: Within Functional Limits for tasks assessed  General Comments      Exercises     Assessment/Plan    PT Assessment Patient needs continued PT services  PT Problem List Decreased safety awareness;Decreased knowledge of use of DME;Decreased strength;Decreased range of motion;Decreased activity tolerance;Decreased balance;Decreased mobility;Decreased cognition       PT Treatment Interventions DME instruction;Gait training;Functional mobility training;Therapeutic activities;Therapeutic exercise;Balance training;Cognitive remediation;Patient/family  education;Neuromuscular re-education    PT Goals (Current goals can be found in the Care Plan section)  Acute Rehab PT Goals Patient Stated Goal: go home PT Goal Formulation: With patient Time For Goal Achievement: 01/06/18 Potential to Achieve Goals: Good    Frequency Min 2X/week   Barriers to discharge        Co-evaluation               AM-PAC PT "6 Clicks" Daily Activity  Outcome Measure Difficulty turning over in bed (including adjusting bedclothes, sheets and blankets)?: None Difficulty moving from lying on back to sitting on the side of the bed? : None Difficulty sitting down on and standing up from a chair with arms (e.g., wheelchair, bedside commode, etc,.)?: A Little Help needed moving to and from a bed to chair (including a wheelchair)?: None Help needed walking in hospital room?: A Little Help needed climbing 3-5 steps with a railing? : A Lot 6 Click Score: 20    End of Session Equipment Utilized During Treatment: Gait belt Activity Tolerance: Patient limited by fatigue;Patient tolerated treatment well   Nurse Communication: Mobility status PT Visit Diagnosis: Muscle weakness (generalized) (M62.81);Difficulty in walking, not elsewhere classified (R26.2)    Time: 3300-7622 PT Time Calculation (min) (ACUTE ONLY): 26 min   Charges:   PT Evaluation $PT Eval Low Complexity: 1 Low PT Treatments $Therapeutic Exercise: 8-22 mins   PT G Codes:        Kreg Shropshire, DPT 12/23/2017, 3:52 PM

## 2017-12-23 NOTE — Progress Notes (Signed)
Hospitalist notified of elevated Troponin and rising= 0.06; Acknowledged; new orders written. Barbaraann Faster, RN 6:21 AM 12/23/2017

## 2017-12-24 LAB — URINE CULTURE: Culture: <10000 — AB

## 2017-12-24 LAB — BASIC METABOLIC PANEL
ANION GAP: 6 (ref 5–15)
BUN: 15 mg/dL (ref 8–23)
CHLORIDE: 112 mmol/L — AB (ref 98–111)
CO2: 22 mmol/L (ref 22–32)
CREATININE: 1.11 mg/dL — AB (ref 0.44–1.00)
Calcium: 8.8 mg/dL — ABNORMAL LOW (ref 8.9–10.3)
GFR calc non Af Amer: 44 mL/min — ABNORMAL LOW (ref >60)
GFR, EST AFRICAN AMERICAN: 51 mL/min — AB (ref >60)
Glucose, Bld: 112 mg/dL — ABNORMAL HIGH (ref 70–99)
POTASSIUM: 3.7 mmol/L (ref 3.5–5.1)
Sodium: 140 mmol/L (ref 135–145)

## 2017-12-24 LAB — CBC
HEMATOCRIT: 34.7 % — AB (ref 35.0–47.0)
HEMOGLOBIN: 12.5 g/dL (ref 12.0–16.0)
MCH: 33.1 pg (ref 26.0–34.0)
MCHC: 36 g/dL (ref 32.0–36.0)
MCV: 91.7 fL (ref 80.0–100.0)
Platelets: 288 10*3/uL (ref 150–440)
RBC: 3.78 MIL/uL — AB (ref 3.80–5.20)
RDW: 13.6 % (ref 11.5–14.5)
WBC: 10.8 10*3/uL (ref 3.6–11.0)

## 2017-12-24 LAB — GLUCOSE, CAPILLARY: Glucose-Capillary: 107 mg/dL — ABNORMAL HIGH (ref 70–99)

## 2017-12-24 MED ORDER — MEGESTROL ACETATE 400 MG/10ML PO SUSP
400.0000 mg | Freq: Two times a day (BID) | ORAL | Status: DC
  Administered 2017-12-24 – 2017-12-25 (×2): 400 mg via ORAL
  Filled 2017-12-24 (×4): qty 10

## 2017-12-24 MED ORDER — CEFDINIR 300 MG PO CAPS
300.0000 mg | ORAL_CAPSULE | Freq: Two times a day (BID) | ORAL | Status: DC
  Administered 2017-12-24 – 2017-12-25 (×3): 300 mg via ORAL
  Filled 2017-12-24 (×4): qty 1

## 2017-12-24 MED ORDER — ENSURE ENLIVE PO LIQD
237.0000 mL | Freq: Three times a day (TID) | ORAL | Status: DC
  Administered 2017-12-24 – 2017-12-25 (×3): 237 mL via ORAL

## 2017-12-24 NOTE — Progress Notes (Signed)
[facility] NAME: Jeffery Gammell    MR#:  [id number]  DATE OF BIRTH:  Sep 08, 1929  SUBJECTIVE:   Patient more awake states that she is weak not eating much.  REVIEW OF SYSTEMS:    Review of Systems  Constitutional: Negative for chills and fever.  HENT: Negative for congestion and tinnitus.   Eyes: Negative for blurred vision and double vision.  Respiratory: Negative for cough, shortness of breath and wheezing.   Cardiovascular: Negative for chest pain, orthopnea and PND.  Gastrointestinal: Negative for abdominal pain, diarrhea, nausea and vomiting.  Genitourinary: Negative for dysuria and hematuria.  Neurological: Positive for weakness (Generalized). Negative for dizziness, sensory change and focal weakness.  All other systems reviewed and are negative.   Nutrition: heart healthy Tolerating Diet: Yes Tolerating PT: Eval noted.      DRUG ALLERGIES:  No Known Allergies  VITALS:  Blood pressure 138/62, pulse 61, temperature 99.5 F (37.5 C), temperature source Oral, resp. rate 20, height 5\' 1"  (1.549 m), weight 78 kg (171 lb 15.3 oz), SpO2 95 %.  PHYSICAL EXAMINATION:   Physical Exam  GENERAL:  84 y.o.-year-old patient lying in bed in no acute distress.  EYES: Pupils equal, round, reactive to light and accommodation. No scleral icterus. Extraocular muscles intact.  HEENT: Head atraumatic, normocephalic. Oropharynx and nasopharynx clear.  NECK:  Supple, no jugular venous distention. No thyroid enlargement, no tenderness.  LUNGS: Normal breath sounds bilaterally, no wheezing, rales, rhonchi. No use of accessory muscles of respiration.  CARDIOVASCULAR: S1, S2 normal. No murmurs, rubs, or gallops.  ABDOMEN: Soft, nontender, nondistended. Bowel sounds present. No organomegaly or mass.  EXTREMITIES: No cyanosis, clubbing or edema b/l.    NEUROLOGIC: Cranial nerves II through XII are intact. No focal Motor or sensory  deficits b/l.  Globally weak.  PSYCHIATRIC: The patient is alert and oriented x 1.  SKIN: No obvious rash, lesion, or ulcer.    LABORATORY PANEL:   CBC Recent Labs  Lab 12/24/17 0842  WBC 10.8  HGB 12.5  HCT 34.7*  PLT 288   ------------------------------------------------------------------------------------------------------------------  Chemistries  Recent Labs  Lab 12/22/17 1801  12/24/17 0842  NA 139   < > 140  K 3.4*   < > 3.7  CL 106   < > 112*  CO2 17*   < > 22  GLUCOSE 110*   < > 112*  BUN 23   < > 15  CREATININE 1.22*   < > 1.11*  CALCIUM 9.9   < > 8.8*  AST 33  --   --   ALT 18  --   --   ALKPHOS 89  --   --   BILITOT 0.7  --   --    < > = values in this interval not displayed.   ------------------------------------------------------------------------------------------------------------------  Cardiac Enzymes Recent Labs  Lab 12/23/17 0902  TROPONINI 0.05*   ------------------------------------------------------------------------------------------------------------------  RADIOLOGY:  Dg Chest 2 View  Result Date: 12/22/2017 CLINICAL DATA:  Dizziness EXAM: CHEST - 2 VIEW COMPARISON:  01/08/2010 FINDINGS: Borderline cardiomegaly is stable. There is no edema, consolidation, effusion, or pneumothorax. Spondylosis with bridging osteophytes. No acute osseous finding. Osteopenia. Artifact from EKG leads. IMPRESSION: No evidence of active disease. Electronically Signed   By: Monte Fantasia M.D.   On: 12/22/2017 21:18   Ct Abdomen Pelvis W Contrast  Result Date: 12/22/2017 CLINICAL DATA:  Diarrhea EXAM: CT ABDOMEN AND PELVIS WITH CONTRAST TECHNIQUE: Multidetector CT  imaging of the abdomen and pelvis was performed using the standard protocol following bolus administration of intravenous contrast. CONTRAST:  69mL OMNIPAQUE IOHEXOL 300 MG/ML  SOLN COMPARISON:  None available FINDINGS: Lower chest: Extensive coronary atherosclerotic calcification. No acute finding.  Hepatobiliary: Coarse calcification in the right liver tip, incidental.Full gallbladder without ductal dilatation or visible stone. Pancreas: 5 mm cystic density in the uncinate process, considered incidental given size and simple appearance. Spleen: Unremarkable. Adrenals/Urinary Tract: Negative adrenals. No hydronephrosis or stone. Bilateral renal cortical thinning. 2.5 cm cyst in the upper pole right kidney. Other cortical low densities are too small for densitometry. Full urinary bladder without acute finding Stomach/Bowel:  No obstruction. No inflammatory changes. Vascular/Lymphatic: No acute vascular abnormality. Atherosclerotic calcification. Large IMA and left colic artery, likely compensation for severe atheromatous narrowing of the proximal SMA. No mass or adenopathy. Reproductive:Hysterectomy. Other: No ascites or pneumoperitoneum. Musculoskeletal: No acute abnormalities. Advanced disc and facet degeneration with levoscoliosis. Bilateral hip osteoarthritis, particularly severe on the right. Prior left proximal femur fracture and repair. Osteopenia. IMPRESSION: No acute finding.  No evidence of colitis. Electronically Signed   By: Monte Fantasia M.D.   On: 12/22/2017 21:16     ASSESSMENT AND PLAN:   84 year old female with past medical history of hypertension, depression, osteoarthritis, mild dementia who presents to the hospital due to dizziness, diarrhea and noted to have mild acute kidney injury along with a UTI.  1.  Dizziness/lightheadedness-secondary to dehydration from ongoing diarrhea. -Continue IV fluids,   2.  Acute diarrhea-now resolved.     3.  Acute kidney injury-secondary to dehydration from prerenal azotemia.  Continue gentle IV fluids, creatinine improving.  4.  Acute cystitis urine culture shows less than 10,000 colonies  Will change to oral Ceftin   5.  Elevated TSH- patient's free T4 is within normal range.  This is likely euthyroid sick syndrome.  Will repeat TSH in  few weeks after acute illness resolves.  6.  Elevated troponin-secondary to supply demand ischemia from acute kidney injury, dehydration.  7.  Generalized weakness/unsteady gait- appreciate physical therapy evaluation will arrange home health services upon discharge.  8.  Essential hypertension-continue metoprolol, lisinopril.  9.  Depression-continue Celexa.     All the records are reviewed and case discussed with Care Management/Social Worker. Management plans discussed with the patient, family and they are in agreement.  CODE STATUS: Full code  DVT Prophylaxis: Hep SQ  TOTAL TIME TAKING CARE OF THIS PATIENT: 30 minutes.   POSSIBLE D/C IN 1-2 DAYS, DEPENDING ON CLINICAL CONDITION.   Dustin Flock M.D on 12/24/2017 at 2:33 PM  Between 7am to 6pm - Pager - [phone]  After 6pm go to [email or web address] - Proofreader  [facility]  Office  [phone]  CC: Primary care physician; Mikey College, NP

## 2017-12-24 NOTE — Progress Notes (Signed)
Physical Therapy Treatment Patient Details Name: Carolyn Hernandez MRN: [id number] DOB: Feb 04, 1930 Today's Date: 12/24/2017    History of Present Illness 84 y.o. female with a known history of osteoarthritis and HTN admitted from ED due to dizziness, weakness and noted to have a urinary tract infection.    PT Comments    Pt. Was oriented to person, but needed prompting to orient to time, place, and situation. Pt. transferred from supine to sit with MOD. I using bed rail and raised head of bed. After supine to sit pt. Became dizzy, which worsened on first attempt to stand. Pt. was able to transfer sit to stand with cueing to push from bed and CGA from PT secondary to confusion and fatigue. Pt. is still limited by fatigue, but agreed to spend some time in reliner. Pt. Will continue to benefit from PT to increase endurance and walking capacity.  Follow Up Recommendations  Home health PT;Supervision - Intermittent     Equipment Recommendations  None recommended by PT    Recommendations for Other Services       Precautions / Restrictions      Mobility  Bed Mobility Overal bed mobility: Modified Independent             General bed mobility comments: Pt was able to sit EOB w/o direct assist, but used side rail and head elevated roughly 45 deg. Pt. was strong and safe with supine to sit, but got dizzy when sitting EOB.  Transfers Overall transfer level: Needs assistance Equipment used: Rolling walker (2 wheeled) Transfers: Sit to/from Stand Sit to Stand: Min guard         General transfer comment: Needed multiple attempts and verbal cueing with sit to stand to push from bed. PT provided CGA with commode to recliner transfer secondary to dizziness.  Ambulation/Gait Ambulation/Gait assistance: Min guard Gait Distance (Feet): 5 Feet Assistive device: Rolling walker (2 wheeled)       General Gait Details: Pt. ambulates slow and needs CGA secondary to mild situational  awareness. Pt reoprts fatigue and mild dizziness but O2 reamins in high 90's and only mild increase in HR from sitting baseline.   Stairs             Wheelchair Mobility    Modified Rankin (Stroke Patients Only)       Balance Overall balance assessment: Modified Independent                                          Cognition Arousal/Alertness: Awake/alert(not oriented to time and specific hospital) Behavior During Therapy: WFL for tasks assessed/performed(had some confusion with diaper/toielting) Overall Cognitive Status: No family/caregiver present to determine baseline cognitive functioning                                 General Comments: Pt appears to be mildly confused t/o session, oriented to self and with cues can correctly identify and problem solve month/place, unclear why she is here. Follows all commands well.      Exercises General Exercises - Lower Extremity Quad Sets: Supine;Both;10 reps Short Arc Quad: Supine;Both;10 reps Other Exercises Other Exercises: bed side commode transfer and sit to/from stand(pt. was able to clean her self with CGA)    General Comments        Pertinent Vitals/Pain Pain Assessment:  0-10 Pain Score: 8  Pain Location: behind R knee(pt. states this pain is baseline) Pain Descriptors / Indicators: Discomfort Pain Intervention(s): Limited activity within patient's tolerance;Repositioned    Home Living                      Prior Function            PT Goals (current goals can now be found in the care plan section) Acute Rehab PT Goals Patient Stated Goal: go home PT Goal Formulation: With patient Time For Goal Achievement: 01/06/18 Potential to Achieve Goals: Good Progress towards PT goals: Progressing toward goals    Frequency    Min 2X/week      PT Plan Current plan remains appropriate    Co-evaluation              AM-PAC PT "6 Clicks" Daily Activity  Outcome  Measure  Difficulty turning over in bed (including adjusting bedclothes, sheets and blankets)?: None Difficulty moving from lying on back to sitting on the side of the bed? : None Difficulty sitting down on and standing up from a chair with arms (e.g., wheelchair, bedside commode, etc,.)?: Unable Help needed moving to and from a bed to chair (including a wheelchair)?: A Little Help needed walking in hospital room?: A Little Help needed climbing 3-5 steps with a railing? : A Lot 6 Click Score: 17    End of Session Equipment Utilized During Treatment: Gait belt Activity Tolerance: Patient limited by fatigue;Patient tolerated treatment well;Other (comment)(limited by dizziness) Patient left: in chair;with call bell/phone within reach;with chair alarm set Nurse Communication: Mobility status PT Visit Diagnosis: Muscle weakness (generalized) (M62.81);Difficulty in walking, not elsewhere classified (R26.2)     Time: 1401-1440 PT Time Calculation (min) (ACUTE ONLY): 39 min  Charges:  $Gait Training: 8-22 mins $Therapeutic Exercise: 8-22 mins $Therapeutic Activity: 8-22 mins                    G Codes:       Rosario Adie, SPT    Rosario Adie 12/24/2017, 4:07 PM

## 2017-12-25 LAB — BASIC METABOLIC PANEL
ANION GAP: 5 (ref 5–15)
BUN: 25 mg/dL — AB (ref 8–23)
CALCIUM: 8.4 mg/dL — AB (ref 8.9–10.3)
CO2: 21 mmol/L — ABNORMAL LOW (ref 22–32)
Chloride: 115 mmol/L — ABNORMAL HIGH (ref 98–111)
Creatinine, Ser: 1.09 mg/dL — ABNORMAL HIGH (ref 0.44–1.00)
GFR calc Af Amer: 52 mL/min — ABNORMAL LOW (ref >60)
GFR, EST NON AFRICAN AMERICAN: 45 mL/min — AB (ref >60)
Glucose, Bld: 96 mg/dL (ref 70–99)
Potassium: 3.8 mmol/L (ref 3.5–5.1)
SODIUM: 141 mmol/L (ref 135–145)

## 2017-12-25 LAB — CBC
HCT: 32.6 % — ABNORMAL LOW (ref 35.0–47.0)
HEMOGLOBIN: 11.3 g/dL — AB (ref 12.0–16.0)
MCH: 31.9 pg (ref 26.0–34.0)
MCHC: 34.5 g/dL (ref 32.0–36.0)
MCV: 92.5 fL (ref 80.0–100.0)
Platelets: 278 10*3/uL (ref 150–440)
RBC: 3.52 MIL/uL — ABNORMAL LOW (ref 3.80–5.20)
RDW: 13.6 % (ref 11.5–14.5)
WBC: 11.2 10*3/uL — ABNORMAL HIGH (ref 3.6–11.0)

## 2017-12-25 LAB — GLUCOSE, CAPILLARY: GLUCOSE-CAPILLARY: 92 mg/dL (ref 70–99)

## 2017-12-25 MED ORDER — CEFDINIR 300 MG PO CAPS: 300.0000 mg | ORAL_CAPSULE | Freq: Two times a day (BID) | ORAL | 0 refills | Status: AC

## 2017-12-25 NOTE — Care Management Note (Signed)
Case Management Note  Patient Details  Name: Carolyn Hernandez MRN: [id number] Date of Birth: 21-Dec-1929   Patient to discharge home today.  Plan of care discussed with daughter.  Per daughter her and her brother provide transportation. PCP kennedy. Patient has a Corporate investment banker in the home. PT has assessed patient and recommend home health PT.  Daughter agrreable to home health services and does not have preference of agency.  Referral [facility] to [facility].  RNCM signing off   Subjective/Objective:                    Action/Plan:   Expected Discharge Date:  12/25/17               Expected Discharge Plan:  [place]  In-House Referral:     Discharge planning Services  CM Consult  Post Acute Care Choice:  Home Health Choice offered to:  Adult Children  DME Arranged:    DME Agency:     HH Arranged:  PT, Nurse's Aide [place] Agency:  [place]  Status of Service:  Completed, signed off  If discussed at [place] of Stay Meetings, dates discussed:    Additional Comments:  Beverly Sessions, RN 12/25/2017, 11:27 AM

## 2017-12-25 NOTE — Progress Notes (Signed)
IV was removed. Discharge instructions, follow-up appointments, and prescriptions were provided to the pt and family at bedside. All questions answered.  The pt was taken downstairs via wheelchair by RN and pt left in car with daughter in law.

## 2017-12-25 NOTE — Discharge Summary (Signed)
[name] at [facility], 84 y.o., DOB [birth year], MRN [id number]. Admission date: 12/22/2017 Discharge Date 12/25/2017 Primary MD Mikey College, NP Admitting Physician Amelia Jo, MD  Admission Diagnosis  Dehydration [E86.0] Orthostasis [I95.1] Acute cystitis without hematuria [N30.00] Diarrhea, unspecified type [R19.7]  Discharge Diagnosis   Active Problems: Dizziness and lightheadedness secondary to dehydration Acute diarrhea now resolved Acute kidney injury due to dehydration now resolved Acute cystitis Elevated TSH with free T4 being normal Elevated troponin Essential hypertension Depression   Hospital Course 84 year old female with past medical history of hypertension, depression, osteoarthritis, mild dementia who presents to the hospital due to dizziness, diarrhea and noted to have mild acute kidney injury along with a UTI.  Patient was given IV fluids with significant improvement in renal function.  And also was thought to have UTI however urinalysis was positive but urine culture showed less than 10,000 bacteria consistent with cystitis.  With treatment with IV fluids she is doing much better.  She was seen by PT and home PT has been recommended.  Which we have arranged.             Consults  None  Significant Tests:  See full reports for all details     Dg Chest 2 View  Result Date: 12/22/2017 CLINICAL DATA:  Dizziness EXAM: CHEST - 2 VIEW COMPARISON:  01/08/2010 FINDINGS: Borderline cardiomegaly is stable. There is no edema, consolidation, effusion, or pneumothorax. Spondylosis with bridging osteophytes. No acute osseous finding. Osteopenia. Artifact from EKG leads. IMPRESSION: No evidence of active disease. Electronically Signed   By: Monte Fantasia M.D.   On: 12/22/2017 21:18   Ct Abdomen Pelvis W Contrast  Result Date: 12/22/2017 CLINICAL DATA:  Diarrhea EXAM: CT ABDOMEN AND PELVIS WITH CONTRAST  TECHNIQUE: Multidetector CT imaging of the abdomen and pelvis was performed using the standard protocol following bolus administration of intravenous contrast. CONTRAST:  25mL OMNIPAQUE IOHEXOL 300 MG/ML  SOLN COMPARISON:  None available FINDINGS: Lower chest: Extensive coronary atherosclerotic calcification. No acute finding. Hepatobiliary: Coarse calcification in the right liver tip, incidental.Full gallbladder without ductal dilatation or visible stone. Pancreas: 5 mm cystic density in the uncinate process, considered incidental given size and simple appearance. Spleen: Unremarkable. Adrenals/Urinary Tract: Negative adrenals. No hydronephrosis or stone. Bilateral renal cortical thinning. 2.5 cm cyst in the upper pole right kidney. Other cortical low densities are too small for densitometry. Full urinary bladder without acute finding Stomach/Bowel:  No obstruction. No inflammatory changes. Vascular/Lymphatic: No acute vascular abnormality. Atherosclerotic calcification. Large IMA and left colic artery, likely compensation for severe atheromatous narrowing of the proximal SMA. No mass or adenopathy. Reproductive:Hysterectomy. Other: No ascites or pneumoperitoneum. Musculoskeletal: No acute abnormalities. Advanced disc and facet degeneration with levoscoliosis. Bilateral hip osteoarthritis, particularly severe on the right. Prior left proximal femur fracture and repair. Osteopenia. IMPRESSION: No acute finding.  No evidence of colitis. Electronically Signed   By: Monte Fantasia M.D.   On: 12/22/2017 21:16       Today   Subjective:   Carolyn Hernandez patient doing much better more awake back to baseline Objective:   Blood pressure (!) 156/57, pulse (!) 52, temperature 98.3 F (36.8 C), temperature source Oral, resp. rate 19, height 5\' 1"  (1.549 m), weight 78 kg (171 lb 15.3 oz), SpO2 97 %.  .  Intake/Output Summary (Last 24 hours) at 12/25/2017 1429 Last data filed at 12/25/2017 1423 Gross per 24  hour  Intake 2050 ml  Output  775 ml  Net 1275 ml    Exam VITAL SIGNS: Blood pressure (!) 156/57, pulse (!) 52, temperature 98.3 F (36.8 C), temperature source Oral, resp. rate 19, height 5\' 1"  (1.549 m), weight 78 kg (171 lb 15.3 oz), SpO2 97 %.  GENERAL:  84 y.o.-year-old patient lying in the bed with no acute distress.  EYES: Pupils equal, round, reactive to light and accommodation. No scleral icterus. Extraocular muscles intact.  HEENT: Head atraumatic, normocephalic. Oropharynx and nasopharynx clear.  NECK:  Supple, no jugular venous distention. No thyroid enlargement, no tenderness.  LUNGS: Normal breath sounds bilaterally, no wheezing, rales,rhonchi or crepitation. No use of accessory muscles of respiration.  CARDIOVASCULAR: S1, S2 normal. No murmurs, rubs, or gallops.  ABDOMEN: Soft, nontender, nondistended. Bowel sounds present. No organomegaly or mass.  EXTREMITIES: No pedal edema, cyanosis, or clubbing.  NEUROLOGIC: Cranial nerves II through XII are intact. Muscle strength 5/5 in all extremities. Sensation intact. Gait not checked.  PSYCHIATRIC: The patient is alert and oriented x 3.  SKIN: No obvious rash, lesion, or ulcer.   Data Review     CBC w Diff:  Lab Results  Component Value Date   WBC 11.2 (H) 12/25/2017   HGB 11.3 (L) 12/25/2017   HCT 32.6 (L) 12/25/2017   PLT 278 12/25/2017   CMP:  Lab Results  Component Value Date   NA 141 12/25/2017   NA 146 (H) 09/11/2015   K 3.8 12/25/2017   CL 115 (H) 12/25/2017   CO2 21 (L) 12/25/2017   BUN 25 (H) 12/25/2017   BUN 21 09/11/2015   CREATININE 1.09 (H) 12/25/2017   CREATININE 1.12 (H) 10/06/2017   PROT 7.7 12/22/2017   PROT 6.4 09/11/2015   ALBUMIN 4.0 12/22/2017   ALBUMIN 3.9 09/11/2015   BILITOT 0.7 12/22/2017   BILITOT <0.2 09/11/2015   ALKPHOS 89 12/22/2017   AST 33 12/22/2017   ALT 18 12/22/2017  .  Micro Results Recent Results (from the past 240 hour(s))  Urine culture     Status: Abnormal    Collection Time: 12/22/17  6:01 PM  Result Value Ref Range Status   Specimen Description   Final    URINE, CATHETERIZED Performed at [facility], [address], [place], [place] [address]    Special Requests   Final    NONE Performed at [facility], [address] [place], [place] [address]    Culture (A)  Final    <10,000 COLONIES/mL INSIGNIFICANT GROWTH Performed at [facility] [address], [place], [place] [address]    Report Status 12/24/2017 FINAL  Final  MRSA PCR Screening     Status: None   Collection Time: 12/23/17  7:30 AM  Result Value Ref Range Status   MRSA by PCR NEGATIVE NEGATIVE Final    Comment:        The GeneXpert MRSA Assay (FDA approved for NASAL specimens only), is one component of a comprehensive MRSA colonization surveillance program. It is not intended to diagnose MRSA infection nor to guide or monitor treatment for MRSA infections. Performed at [facility], [address] [place], [place] [address]         Code Status Orders  (From admission, onward)        Start     Ordered   12/23/17 0151  Full code  Continuous     12/23/17 0150    Code Status History    This patient has a current code  status but no historical code status.          Follow-up Information    Mikey College, NP. Go on 12/29/2017.   Specialty:  Nurse Practitioner Why:  Monday at 8:20am for hospital follow-up Contact information: [address] [place] [address] [phone]           Discharge Medications   Allergies as of 12/25/2017   No Known Allergies     Medication List    STOP taking these medications   furosemide 40 MG tablet Commonly known as:  LASIX   potassium chloride SA 20 MEQ tablet Commonly known as:  K-DUR,KLOR-CON     TAKE these medications   cefdinir 300 MG capsule Commonly known as:  OMNICEF Take 1 capsule (300 mg total) by mouth every 12 (twelve) hours for 5 days.    citalopram 20 MG tablet Commonly known as:  CELEXA Take 1 tablet (20 mg total) by mouth daily.   diclofenac sodium 1 % Gel Commonly known as:  VOLTAREN Apply 2 g topically 4 (four) times daily.   lisinopril 40 MG tablet Commonly known as:  PRINIVIL,ZESTRIL Take 1 tablet (40 mg total) by mouth daily.   metoprolol tartrate 50 MG tablet Commonly known as:  LOPRESSOR Take 1 tablet (50 mg total) by mouth 2 (two) times daily.   NIFEdipine 90 MG 24 hr tablet Commonly known as:  ADALAT CC Take 1 tablet (90 mg total) by mouth daily.          Total Time in preparing paper work, data evaluation and todays exam - 57 minutes  Dustin Flock M.D on 12/25/2017 at 2:29 PM [place]  [phone]

## 2017-12-26 ENCOUNTER — Telehealth: Payer: Self-pay

## 2017-12-26 DIAGNOSIS — Z789 Other specified health status: Secondary | ICD-10-CM

## 2017-12-26 NOTE — Telephone Encounter (Signed)
Transition Care Management Follow-up Telephone Call  Spoke with patients son, [name]    How have you been since you were released from the hospital? "shes feeling better today"  Do you understand why you were in the hospital? yes  Do you have a copy of your discharge instructions Yes Do you understand the discharge instrcutions? yes  Where were you discharged to? Assisted Living  Do you have support at home? Yes    Items Reviewed:  Medications obtained Yes  Medications reviewed: Yes  Dietary changes reviewed: yes  Home Health? N/A  DME ordered at discharge obtained? NA  Medical supplies: NA    Functional Questionnaire:   Activities of Daily Living (ADLs):   She states they are independent in the following: ambulation, feeding, continence, grooming and toileting States they require assistance with the following: bathing and hygiene, dressing and medication management  Any transportation issues/concerns?: no  Any patient concerns? Yes, requests assistance with medicaid - referral sent to connected care   Confirmed importance and date/time of follow-up visits scheduled with PCP: yes appointment with Lissa Merlin on 12/29/2017 at 8:20am.   Confirm appointment scheduled with specialist? Yes  Confirmed with patient if condition begins to worsen call PCP or If it's emergency go to the ER.

## 2017-12-28 DIAGNOSIS — Z7982 Long term (current) use of aspirin: Secondary | ICD-10-CM | POA: Diagnosis not present

## 2017-12-28 DIAGNOSIS — Z792 Long term (current) use of antibiotics: Secondary | ICD-10-CM | POA: Diagnosis not present

## 2017-12-28 DIAGNOSIS — E119 Type 2 diabetes mellitus without complications: Secondary | ICD-10-CM | POA: Diagnosis not present

## 2017-12-28 DIAGNOSIS — M19041 Primary osteoarthritis, right hand: Secondary | ICD-10-CM | POA: Diagnosis not present

## 2017-12-28 DIAGNOSIS — Z9181 History of falling: Secondary | ICD-10-CM | POA: Diagnosis not present

## 2017-12-28 DIAGNOSIS — I10 Essential (primary) hypertension: Secondary | ICD-10-CM | POA: Diagnosis not present

## 2017-12-28 DIAGNOSIS — M170 Bilateral primary osteoarthritis of knee: Secondary | ICD-10-CM | POA: Diagnosis not present

## 2017-12-28 DIAGNOSIS — M6281 Muscle weakness (generalized): Secondary | ICD-10-CM | POA: Diagnosis not present

## 2017-12-28 DIAGNOSIS — I420 Dilated cardiomyopathy: Secondary | ICD-10-CM | POA: Diagnosis not present

## 2017-12-28 DIAGNOSIS — N3000 Acute cystitis without hematuria: Secondary | ICD-10-CM | POA: Diagnosis not present

## 2017-12-28 DIAGNOSIS — M19042 Primary osteoarthritis, left hand: Secondary | ICD-10-CM | POA: Diagnosis not present

## 2017-12-29 ENCOUNTER — Other Ambulatory Visit: Payer: Self-pay

## 2017-12-29 ENCOUNTER — Encounter: Payer: Self-pay | Admitting: Nurse Practitioner

## 2017-12-29 ENCOUNTER — Ambulatory Visit (INDEPENDENT_AMBULATORY_CARE_PROVIDER_SITE_OTHER): Payer: Medicare Other | Admitting: Nurse Practitioner

## 2017-12-29 VITALS — BP 165/48 | HR 78 | Temp 98.0°F | Ht 61.0 in | Wt 172.8 lb

## 2017-12-29 DIAGNOSIS — I10 Essential (primary) hypertension: Secondary | ICD-10-CM

## 2017-12-29 DIAGNOSIS — Z09 Encounter for follow-up examination after completed treatment for conditions other than malignant neoplasm: Secondary | ICD-10-CM | POA: Diagnosis not present

## 2017-12-29 DIAGNOSIS — N179 Acute kidney failure, unspecified: Secondary | ICD-10-CM | POA: Diagnosis not present

## 2017-12-29 DIAGNOSIS — R42 Dizziness and giddiness: Secondary | ICD-10-CM

## 2017-12-29 DIAGNOSIS — F0390 Unspecified dementia without behavioral disturbance: Secondary | ICD-10-CM | POA: Insufficient documentation

## 2017-12-29 LAB — BASIC METABOLIC PANEL WITH GFR
BUN/Creatinine Ratio: 13 (calc) (ref 6–22)
BUN: 16 mg/dL (ref 7–25)
CO2: 17 mmol/L — ABNORMAL LOW (ref 20–32)
Calcium: 10 mg/dL (ref 8.6–10.4)
Chloride: 110 mmol/L (ref 98–110)
Creat: 1.21 mg/dL — ABNORMAL HIGH (ref 0.60–0.88)
GFR, Est African American: 47 mL/min/{1.73_m2} — ABNORMAL LOW (ref >=60)
GFR, Est Non African American: 40 mL/min/{1.73_m2} — ABNORMAL LOW (ref >=60)
Glucose, Bld: 109 mg/dL — ABNORMAL HIGH (ref 65–99)
Potassium: 3.7 mmol/L (ref 3.5–5.3)
Sodium: 141 mmol/L (ref 135–146)

## 2017-12-29 MED ORDER — AMLODIPINE BESYLATE 10 MG PO TABS: 10.0000 mg | ORAL_TABLET | Freq: Every day | ORAL | 3 refills | Status: DC

## 2017-12-29 NOTE — Progress Notes (Signed)
Subjective:    Patient ID: Carolyn Hernandez, female    DOB: 12-22-1929, 84 y.o.   MRN: [id number]  Carolyn Hernandez is a 84 y.o. female presenting on 12/29/2017 for Hospitalization Follow-up (dehydration, acute cystitis w/ hematuria and dizziness)   HPI HOSPITAL FOLLOW-UP VISIT Hospital/Location: [facility] Date of Admission: 12/22/2017 Date of Discharge: 12/25/2017 Transitions of care telephone call: 12/26/2017  Reason for Admission: Dehydration, Orthostasis Primary (+Secondary) Diagnosis: acute cystitis without hematuria, mild acute kidney injury, diarrhea  FOLLOW-UP - Hospital H&P and Discharge Summary have been reviewed - Patient presents today 4 days after recent hospitalization. Brief summary of recent course, patient had symptoms of dizziness after diarrhea.  She was found to have mild acute kidney injury with cystitis (<10,000 bacteria) and was hospitalized for fluid resuscitation and monitoring of AKI. Treated with IV fluids and had significant improvement in renal function. - New medications on discharge: cefdinir 300 mg bid x 5 days;  - Changes to current meds on discharge: STOP furosemide 40 mg and Potassium Chloride 20 meq  - Today reports overall has done well after discharge. Symptoms of confusion and urinary symptoms have resolved.  She continues to have "swimmy-headed" symptoms when standing.  Denies spinning symptoms.   - Family requests advice about assisted living, personal care services.  Patient is continuing to be independent with ADLs, but requires assistance with almost all iADLs.  She is able to prepare a sandwich and bowl of cereal.  Has hot meal delivered by family at least 3-4 times per week. - Patient has significant problems making short term memories.  Family suggests she is having more difficulty than patient will admit with memory and iADLs like cleaning.  Social History   Tobacco Use  . Smoking status: Never Smoker  . Smokeless tobacco: Never Used    Substance Use Topics  . Alcohol use: No  . Drug use: No    Review of Systems Per HPI unless specifically indicated above  Outpatient Encounter Medications as of 12/29/2017  Medication Sig  . cefdinir (OMNICEF) 300 MG capsule Take 1 capsule (300 mg total) by mouth every 12 (twelve) hours for 5 days.  . citalopram (CELEXA) 20 MG tablet Take 1 tablet (20 mg total) by mouth daily.  Marland Kitchen lisinopril (PRINIVIL,ZESTRIL) 40 MG tablet Take 1 tablet (40 mg total) by mouth daily.  . metoprolol tartrate (LOPRESSOR) 50 MG tablet Take 1 tablet (50 mg total) by mouth 2 (two) times daily.  . [DISCONTINUED] NIFEdipine (ADALAT CC) 90 MG 24 hr tablet Take 1 tablet (90 mg total) by mouth daily.  Marland Kitchen amLODipine (NORVASC) 10 MG tablet Take 1 tablet (10 mg total) by mouth daily.  . diclofenac sodium (VOLTAREN) 1 % GEL Apply 2 g topically 4 (four) times daily. (Patient not taking: Reported on 12/29/2017)   No facility-administered encounter medications on file as of 12/29/2017.       Objective:    BP (!) 165/48 (BP Location: Right Arm, Patient Position: Sitting, Cuff Size: Large)   Pulse 78   Temp 98 F (36.7 C) (Oral)   Ht 5\' 1"  (1.549 m)   Wt 172 lb 12.8 oz (78.4 kg)   SpO2 100%   BMI 32.65 kg/m   Wt Readings from Last 3 Encounters:  12/29/17 172 lb 12.8 oz (78.4 kg)  12/24/17 171 lb 15.3 oz (78 kg)  10/06/17 155 lb (70.3 kg)    Physical Exam  Constitutional: She is oriented to person, place, and time. She appears well-developed and  well-nourished. No distress.  HENT:  Head: Normocephalic and atraumatic.  Cardiovascular: Normal rate, regular rhythm, S1 normal, S2 normal, normal heart sounds and intact distal pulses.  Pulmonary/Chest: Effort normal and breath sounds normal. No respiratory distress.  Abdominal: Soft. Bowel sounds are normal. She exhibits distension (mild). There is no hepatosplenomegaly. There is generalized tenderness (mild). There is no rigidity, no rebound, no guarding, no CVA  tenderness, no tenderness at McBurney's point and negative Murphy's sign. No hernia.  Neurological: She is alert and oriented to person, place, and time. No cranial nerve deficit or sensory deficit. She exhibits abnormal muscle tone. Gait (Pt has shuffled gait.  Uses 2 wheel walker.) abnormal. GCS eye subscore is 4. GCS verbal subscore is 5. GCS motor subscore is 6.  Cognitively impaired: abnormal recent memory.  Patient unable to recall the President's name Carolyn Hernandez) initially or after discussing his name and re-quizzing after 2 more questions.  Skin: Skin is warm and dry.  Psychiatric: She has a normal mood and affect. Her behavior is normal.  Vitals reviewed.   Results for orders placed or performed during the hospital encounter of 12/22/17  Urine culture  Result Value Ref Range   Specimen Description      URINE, CATHETERIZED Performed at [facility], [address] [place], [place] [address]    Special Requests      NONE Performed at [facility], [address] [place], [place] [address]    Culture (A)     <10,000 COLONIES/mL INSIGNIFICANT GROWTH Performed at [facility] [address] [place], [place] [address]    Report Status 12/24/2017 FINAL   MRSA PCR Screening  Result Value Ref Range   MRSA by PCR NEGATIVE NEGATIVE  Basic metabolic panel  Result Value Ref Range   Sodium 139 135 - 145 mmol/L   Potassium 3.4 (L) 3.5 - 5.1 mmol/L   Chloride 106 98 - 111 mmol/L   CO2 17 (L) 22 - 32 mmol/L   Glucose, Bld 110 (H) 70 - 99 mg/dL   BUN 23 8 - 23 mg/dL   Creatinine, Ser 1.22 (H) 0.44 - 1.00 mg/dL   Calcium 9.9 8.9 - 10.3 mg/dL   GFR calc non Af Amer 39 (L) >60 mL/min   GFR calc Af Amer 45 (L) >60 mL/min   Anion gap 16 (H) 5 - 15  CBC  Result Value Ref Range   WBC 14.8 (H) 3.6 - 11.0 K/uL   RBC 4.29 3.80 - 5.20 MIL/uL   Hemoglobin 13.7 12.0 - 16.0 g/dL   HCT 38.8 35.0 - 47.0 %   MCV 90.5 80.0 - 100.0 fL   MCH 31.9 26.0 - 34.0 pg    MCHC 35.3 32.0 - 36.0 g/dL   RDW 13.5 11.5 - 14.5 %   Platelets 365 150 - 440 K/uL  Urinalysis, Complete w Microscopic  Result Value Ref Range   Color, Urine YELLOW (A) YELLOW   APPearance CLEAR (A) CLEAR   Specific Gravity, Urine 1.008 1.005 - 1.030   pH 6.0 5.0 - 8.0   Glucose, UA NEGATIVE NEGATIVE mg/dL   Hgb urine dipstick SMALL (A) NEGATIVE   Bilirubin Urine NEGATIVE NEGATIVE   Ketones, ur 5 (A) NEGATIVE mg/dL   Protein, ur 100 (A) NEGATIVE mg/dL   Nitrite NEGATIVE NEGATIVE   Leukocytes, UA LARGE (A) NEGATIVE   RBC / HPF 0-5 0 - 5 RBC/hpf   WBC, UA 21-50 0 - 5 WBC/hpf   Bacteria,  UA NONE SEEN NONE SEEN   Squamous Epithelial / LPF 0-5 0 - 5  Troponin I  Result Value Ref Range   Troponin I 0.03 (HH) <0.03 ng/mL  TSH  Result Value Ref Range   TSH 0.013 (L) 0.350 - 4.500 uIU/mL  Hepatic function panel  Result Value Ref Range   Total Protein 7.7 6.5 - 8.1 g/dL   Albumin 4.0 3.5 - 5.0 g/dL   AST 33 15 - 41 U/L   ALT 18 0 - 44 U/L   Alkaline Phosphatase 89 38 - 126 U/L   Total Bilirubin 0.7 0.3 - 1.2 mg/dL   Bilirubin, Direct 0.2 0.0 - 0.2 mg/dL   Indirect Bilirubin 0.5 0.3 - 0.9 mg/dL  Lipase, blood  Result Value Ref Range   Lipase 47 11 - 51 U/L  Basic metabolic panel  Result Value Ref Range   Sodium 144 135 - 145 mmol/L   Potassium 3.4 (L) 3.5 - 5.1 mmol/L   Chloride 114 (H) 98 - 111 mmol/L   CO2 22 22 - 32 mmol/L   Glucose, Bld 127 (H) 70 - 99 mg/dL   BUN 16 8 - 23 mg/dL   Creatinine, Ser 0.96 0.44 - 1.00 mg/dL   Calcium 9.0 8.9 - 10.3 mg/dL   GFR calc non Af Amer 52 (L) >60 mL/min   GFR calc Af Amer >60 >60 mL/min   Anion gap 8 5 - 15  CBC  Result Value Ref Range   WBC 12.4 (H) 3.6 - 11.0 K/uL   RBC 4.16 3.80 - 5.20 MIL/uL   Hemoglobin 12.8 12.0 - 16.0 g/dL   HCT 38.3 35.0 - 47.0 %   MCV 91.9 80.0 - 100.0 fL   MCH 30.8 26.0 - 34.0 pg   MCHC 33.5 32.0 - 36.0 g/dL   RDW 13.4 11.5 - 14.5 %   Platelets 342 150 - 440 K/uL  T4, free  Result Value Ref Range    Free T4 0.91 0.82 - 1.77 ng/dL  Troponin I  Result Value Ref Range   Troponin I 0.06 (HH) <0.03 ng/mL  Troponin I  Result Value Ref Range   Troponin I 0.05 (HH) <0.03 ng/mL  Glucose, capillary  Result Value Ref Range   Glucose-Capillary 115 (H) 70 - 99 mg/dL   Comment 1 Notify RN   Basic metabolic panel  Result Value Ref Range   Sodium 140 135 - 145 mmol/L   Potassium 3.7 3.5 - 5.1 mmol/L   Chloride 112 (H) 98 - 111 mmol/L   CO2 22 22 - 32 mmol/L   Glucose, Bld 112 (H) 70 - 99 mg/dL   BUN 15 8 - 23 mg/dL   Creatinine, Ser 1.11 (H) 0.44 - 1.00 mg/dL   Calcium 8.8 (L) 8.9 - 10.3 mg/dL   GFR calc non Af Amer 44 (L) >60 mL/min   GFR calc Af Amer 51 (L) >60 mL/min   Anion gap 6 5 - 15  CBC  Result Value Ref Range   WBC 10.8 3.6 - 11.0 K/uL   RBC 3.78 (L) 3.80 - 5.20 MIL/uL   Hemoglobin 12.5 12.0 - 16.0 g/dL   HCT 34.7 (L) 35.0 - 47.0 %   MCV 91.7 80.0 - 100.0 fL   MCH 33.1 26.0 - 34.0 pg   MCHC 36.0 32.0 - 36.0 g/dL   RDW 13.6 11.5 - 14.5 %   Platelets 288 150 - 440 K/uL  Glucose, capillary  Result Value Ref Range  Glucose-Capillary 107 (H) 70 - 99 mg/dL   Comment 1 Notify RN   Basic metabolic panel  Result Value Ref Range   Sodium 141 135 - 145 mmol/L   Potassium 3.8 3.5 - 5.1 mmol/L   Chloride 115 (H) 98 - 111 mmol/L   CO2 21 (L) 22 - 32 mmol/L   Glucose, Bld 96 70 - 99 mg/dL   BUN 25 (H) 8 - 23 mg/dL   Creatinine, Ser 1.09 (H) 0.44 - 1.00 mg/dL   Calcium 8.4 (L) 8.9 - 10.3 mg/dL   GFR calc non Af Amer 45 (L) >60 mL/min   GFR calc Af Amer 52 (L) >60 mL/min   Anion gap 5 5 - 15  CBC  Result Value Ref Range   WBC 11.2 (H) 3.6 - 11.0 K/uL   RBC 3.52 (L) 3.80 - 5.20 MIL/uL   Hemoglobin 11.3 (L) 12.0 - 16.0 g/dL   HCT 32.6 (L) 35.0 - 47.0 %   MCV 92.5 80.0 - 100.0 fL   MCH 31.9 26.0 - 34.0 pg   MCHC 34.5 32.0 - 36.0 g/dL   RDW 13.6 11.5 - 14.5 %   Platelets 278 150 - 440 K/uL  Glucose, capillary  Result Value Ref Range   Glucose-Capillary 92 70 - 99 mg/dL     Comment 1 Notify RN       Assessment & Plan:   Problem List Items Addressed This Visit      Cardiovascular and Mediastinum   HTN (hypertension) Stable, but with decreasing HR and dizziness.   STOP nifedipine. START amlodipine 10 mg daily.  FOLLOW-UP 6 weeks.   Relevant Medications   amLODipine (NORVASC) 10 MG tablet   Other Relevant Orders   BASIC METABOLIC PANEL WITH GFR     Nervous and Auditory   Dementia Stable, but likely complicating urinary complaints.  Continue working toward keeping independence with ADLs.     Genitourinary   ARF (acute renal failure) (HCC) Repeat BMP today to assess status after hospitalization.  Follow-up prn.   Relevant Orders   BASIC METABOLIC PANEL WITH GFR    Other Visit Diagnoses    Hospital discharge follow-up    -  Primary Stable and improving slowly after hospitalization.  Complete abx.  Change BP meds as above to prevent future difficulties with bradycardia and dizziness. Follow-up prn.   Relevant Orders   BASIC METABOLIC PANEL WITH GFR   Dizziness          Meds ordered this encounter  Medications  . amLODipine (NORVASC) 10 MG tablet    Sig: Take 1 tablet (10 mg total) by mouth daily.    Dispense:  90 tablet    Refill:  3    Order Specific Question:   Supervising Provider    Answer:   Olin Hauser [[id number]]    I have reviewed the admission H&P, hospital notes, discharge summary, discharge medication list, and have reconciled the current and discharge medications today.  Follow up plan: Return in about 6 weeks (around 02/09/2018) for diabetes, hypertension.   [name], DNP, AGPCNP-BC Adult Gerontology Primary Care Nurse Practitioner [facility] 12/29/2017, 1:18 PM

## 2017-12-29 NOTE — Patient Instructions (Addendum)
Carolyn Hernandez,   Thank you for coming in to clinic today.  1. Continue all medications as they are currently prescribed.    - Finish your antibiotic course.  2. Continue to drink plenty of fluids, at least 2 L per day and up to 4 L max per day.  3. If no bowel movement in 2-3 days, start Colace 1-2 tablets once daily - If no bowel movement in 4 days, start Colace and Miralax 1 dose daily.   - If no bowel movement in 5 days, use Dulcolax.  4. STOP Nifedipine START amlodipine 10 mg once daily.  Please schedule a follow-up appointment with [name], AGNP. Return in about 6 weeks (around 02/09/2018) for diabetes, hypertension.  If you have any other questions or concerns, please feel free to call the clinic or send a message through [place]. You may also schedule an earlier appointment if necessary.  You will receive a survey after today's visit either digitally by e-mail or paper by [employer]. Your experiences and feedback matter to [place].  Please respond so we know how we are doing as we provide care for you.   [name], DNP, AGNP-BC Adult Gerontology Nurse Practitioner [place]

## 2017-12-31 DIAGNOSIS — I10 Essential (primary) hypertension: Secondary | ICD-10-CM | POA: Diagnosis not present

## 2017-12-31 DIAGNOSIS — E119 Type 2 diabetes mellitus without complications: Secondary | ICD-10-CM | POA: Diagnosis not present

## 2017-12-31 DIAGNOSIS — N3000 Acute cystitis without hematuria: Secondary | ICD-10-CM | POA: Diagnosis not present

## 2017-12-31 DIAGNOSIS — M19041 Primary osteoarthritis, right hand: Secondary | ICD-10-CM | POA: Diagnosis not present

## 2017-12-31 DIAGNOSIS — M170 Bilateral primary osteoarthritis of knee: Secondary | ICD-10-CM | POA: Diagnosis not present

## 2017-12-31 DIAGNOSIS — Z9181 History of falling: Secondary | ICD-10-CM | POA: Diagnosis not present

## 2017-12-31 DIAGNOSIS — Z792 Long term (current) use of antibiotics: Secondary | ICD-10-CM | POA: Diagnosis not present

## 2017-12-31 DIAGNOSIS — M19042 Primary osteoarthritis, left hand: Secondary | ICD-10-CM | POA: Diagnosis not present

## 2017-12-31 DIAGNOSIS — M6281 Muscle weakness (generalized): Secondary | ICD-10-CM | POA: Diagnosis not present

## 2017-12-31 DIAGNOSIS — Z7982 Long term (current) use of aspirin: Secondary | ICD-10-CM | POA: Diagnosis not present

## 2017-12-31 DIAGNOSIS — I420 Dilated cardiomyopathy: Secondary | ICD-10-CM | POA: Diagnosis not present

## 2018-01-05 DIAGNOSIS — N3000 Acute cystitis without hematuria: Secondary | ICD-10-CM | POA: Diagnosis not present

## 2018-01-05 DIAGNOSIS — M19042 Primary osteoarthritis, left hand: Secondary | ICD-10-CM | POA: Diagnosis not present

## 2018-01-05 DIAGNOSIS — I420 Dilated cardiomyopathy: Secondary | ICD-10-CM | POA: Diagnosis not present

## 2018-01-05 DIAGNOSIS — M6281 Muscle weakness (generalized): Secondary | ICD-10-CM | POA: Diagnosis not present

## 2018-01-05 DIAGNOSIS — I10 Essential (primary) hypertension: Secondary | ICD-10-CM | POA: Diagnosis not present

## 2018-01-05 DIAGNOSIS — Z7982 Long term (current) use of aspirin: Secondary | ICD-10-CM | POA: Diagnosis not present

## 2018-01-05 DIAGNOSIS — M19041 Primary osteoarthritis, right hand: Secondary | ICD-10-CM | POA: Diagnosis not present

## 2018-01-05 DIAGNOSIS — M170 Bilateral primary osteoarthritis of knee: Secondary | ICD-10-CM | POA: Diagnosis not present

## 2018-01-05 DIAGNOSIS — Z9181 History of falling: Secondary | ICD-10-CM | POA: Diagnosis not present

## 2018-01-05 DIAGNOSIS — Z792 Long term (current) use of antibiotics: Secondary | ICD-10-CM | POA: Diagnosis not present

## 2018-01-05 DIAGNOSIS — E119 Type 2 diabetes mellitus without complications: Secondary | ICD-10-CM | POA: Diagnosis not present

## 2018-01-07 DIAGNOSIS — N3000 Acute cystitis without hematuria: Secondary | ICD-10-CM | POA: Diagnosis not present

## 2018-01-07 DIAGNOSIS — M19041 Primary osteoarthritis, right hand: Secondary | ICD-10-CM | POA: Diagnosis not present

## 2018-01-07 DIAGNOSIS — I420 Dilated cardiomyopathy: Secondary | ICD-10-CM | POA: Diagnosis not present

## 2018-01-07 DIAGNOSIS — I10 Essential (primary) hypertension: Secondary | ICD-10-CM | POA: Diagnosis not present

## 2018-01-07 DIAGNOSIS — M170 Bilateral primary osteoarthritis of knee: Secondary | ICD-10-CM | POA: Diagnosis not present

## 2018-01-07 DIAGNOSIS — Z7982 Long term (current) use of aspirin: Secondary | ICD-10-CM | POA: Diagnosis not present

## 2018-01-07 DIAGNOSIS — M19042 Primary osteoarthritis, left hand: Secondary | ICD-10-CM | POA: Diagnosis not present

## 2018-01-07 DIAGNOSIS — E119 Type 2 diabetes mellitus without complications: Secondary | ICD-10-CM | POA: Diagnosis not present

## 2018-01-07 DIAGNOSIS — Z792 Long term (current) use of antibiotics: Secondary | ICD-10-CM | POA: Diagnosis not present

## 2018-01-07 DIAGNOSIS — Z9181 History of falling: Secondary | ICD-10-CM | POA: Diagnosis not present

## 2018-01-07 DIAGNOSIS — M6281 Muscle weakness (generalized): Secondary | ICD-10-CM | POA: Diagnosis not present

## 2018-01-12 DIAGNOSIS — Z792 Long term (current) use of antibiotics: Secondary | ICD-10-CM | POA: Diagnosis not present

## 2018-01-12 DIAGNOSIS — Z7982 Long term (current) use of aspirin: Secondary | ICD-10-CM | POA: Diagnosis not present

## 2018-01-12 DIAGNOSIS — Z9181 History of falling: Secondary | ICD-10-CM | POA: Diagnosis not present

## 2018-01-12 DIAGNOSIS — M19042 Primary osteoarthritis, left hand: Secondary | ICD-10-CM | POA: Diagnosis not present

## 2018-01-12 DIAGNOSIS — M6281 Muscle weakness (generalized): Secondary | ICD-10-CM | POA: Diagnosis not present

## 2018-01-12 DIAGNOSIS — E119 Type 2 diabetes mellitus without complications: Secondary | ICD-10-CM | POA: Diagnosis not present

## 2018-01-12 DIAGNOSIS — I10 Essential (primary) hypertension: Secondary | ICD-10-CM | POA: Diagnosis not present

## 2018-01-12 DIAGNOSIS — N3000 Acute cystitis without hematuria: Secondary | ICD-10-CM | POA: Diagnosis not present

## 2018-01-12 DIAGNOSIS — I420 Dilated cardiomyopathy: Secondary | ICD-10-CM | POA: Diagnosis not present

## 2018-01-12 DIAGNOSIS — M170 Bilateral primary osteoarthritis of knee: Secondary | ICD-10-CM | POA: Diagnosis not present

## 2018-01-12 DIAGNOSIS — M19041 Primary osteoarthritis, right hand: Secondary | ICD-10-CM | POA: Diagnosis not present

## 2018-01-16 DIAGNOSIS — Z792 Long term (current) use of antibiotics: Secondary | ICD-10-CM | POA: Diagnosis not present

## 2018-01-16 DIAGNOSIS — M19041 Primary osteoarthritis, right hand: Secondary | ICD-10-CM | POA: Diagnosis not present

## 2018-01-16 DIAGNOSIS — N3000 Acute cystitis without hematuria: Secondary | ICD-10-CM | POA: Diagnosis not present

## 2018-01-16 DIAGNOSIS — I10 Essential (primary) hypertension: Secondary | ICD-10-CM | POA: Diagnosis not present

## 2018-01-16 DIAGNOSIS — M19042 Primary osteoarthritis, left hand: Secondary | ICD-10-CM | POA: Diagnosis not present

## 2018-01-16 DIAGNOSIS — Z7982 Long term (current) use of aspirin: Secondary | ICD-10-CM | POA: Diagnosis not present

## 2018-01-16 DIAGNOSIS — M170 Bilateral primary osteoarthritis of knee: Secondary | ICD-10-CM | POA: Diagnosis not present

## 2018-01-16 DIAGNOSIS — M6281 Muscle weakness (generalized): Secondary | ICD-10-CM | POA: Diagnosis not present

## 2018-01-16 DIAGNOSIS — E119 Type 2 diabetes mellitus without complications: Secondary | ICD-10-CM | POA: Diagnosis not present

## 2018-01-16 DIAGNOSIS — Z9181 History of falling: Secondary | ICD-10-CM | POA: Diagnosis not present

## 2018-01-16 DIAGNOSIS — I420 Dilated cardiomyopathy: Secondary | ICD-10-CM | POA: Diagnosis not present

## 2018-02-09 ENCOUNTER — Ambulatory Visit (INDEPENDENT_AMBULATORY_CARE_PROVIDER_SITE_OTHER): Payer: Medicare Other | Admitting: Nurse Practitioner

## 2018-02-09 ENCOUNTER — Encounter: Payer: Self-pay | Admitting: Nurse Practitioner

## 2018-02-09 ENCOUNTER — Other Ambulatory Visit: Payer: Self-pay

## 2018-02-09 VITALS — BP 135/42 | HR 64 | Temp 97.7°F | Ht 61.0 in | Wt 165.6 lb

## 2018-02-09 DIAGNOSIS — F321 Major depressive disorder, single episode, moderate: Secondary | ICD-10-CM

## 2018-02-09 DIAGNOSIS — G301 Alzheimer's disease with late onset: Secondary | ICD-10-CM

## 2018-02-09 DIAGNOSIS — F4489 Other dissociative and conversion disorders: Secondary | ICD-10-CM

## 2018-02-09 DIAGNOSIS — I10 Essential (primary) hypertension: Secondary | ICD-10-CM

## 2018-02-09 DIAGNOSIS — E119 Type 2 diabetes mellitus without complications: Secondary | ICD-10-CM

## 2018-02-09 DIAGNOSIS — R8290 Unspecified abnormal findings in urine: Secondary | ICD-10-CM | POA: Diagnosis not present

## 2018-02-09 DIAGNOSIS — F0280 Dementia in other diseases classified elsewhere without behavioral disturbance: Secondary | ICD-10-CM

## 2018-02-09 LAB — POCT URINALYSIS DIPSTICK
Bilirubin, UA: NEGATIVE
Glucose, UA: NEGATIVE
Ketones, UA: NEGATIVE
Nitrite, UA: NEGATIVE
Protein, UA: NEGATIVE
Spec Grav, UA: 1.015 (ref 1.010–1.025)
Urobilinogen, UA: 0.2 E.U./dL
pH, UA: 5 (ref 5.0–8.0)

## 2018-02-09 LAB — POCT GLYCOSYLATED HEMOGLOBIN (HGB A1C): Hemoglobin A1C: 5.8 % — AB (ref 4.0–5.6)

## 2018-02-09 NOTE — Patient Instructions (Addendum)
Carolyn Hernandez,   Thank you for coming in to clinic today.  1. Continue all medications without changes.  2. We may consider adding Aricept if there is no urinary tract infection.  She will start this at 5 mg once daily.  www.[email or web address].org - Find the community resources book for additional information.  Please schedule a follow-up appointment with [name], AGNP. Return in about 6 months (around 08/12/2018) for hypertension, diabetes, dementia.  If you have any other questions or concerns, please feel free to call the clinic or send a message through [place]. You may also schedule an earlier appointment if necessary.  You will receive a survey after today's visit either digitally by e-mail or paper by [employer]. Your experiences and feedback matter to [place].  Please respond so we know how we are doing as we provide care for you.   [name], DNP, AGNP-BC Adult Gerontology Nurse Practitioner [facility], [facility]   Dementia Caregiver Guide Dementia is a term used to describe a number of symptoms that affect memory and thinking. The most common symptoms include:  Memory loss.  Trouble with language and communication.  Trouble concentrating.  Poor judgment.  Problems with reasoning.  Child-like behavior and language.  Extreme anxiety.  Angry outbursts.  Wandering from home or public places.  Dementia usually gets worse slowly over time. In the early stages, people with dementia can stay independent and safe with some help. In later stages, they need help with daily tasks such as dressing, grooming, and using the bathroom. How to help the person with dementia cope Dementia can be frightening and confusing. Here are some tips to help the person with dementia cope with changes caused by the disease. General tips  Keep the person on track with his or her routine.  Try to identify areas where the person may need help.  Be supportive,  patient, calm, and encouraging.  Gently remind the person that adjusting to changes takes time.  Help with the tasks that the person has asked for help with.  Keep the person involved in daily tasks and decisions as much as possible.  Encourage conversation, but try not to get frustrated or harried if the person struggles to find words or does not seem to appreciate your help. Communication tips  When the person is talking or seems frustrated, make eye contact and hold the person's hand.  Ask specific questions that need yes or no answers.  Use simple words, short sentences, and a calm voice. Only give one direction at a time.  When offering choices, limit them to just 1 or 2.  Avoid correcting the person in a negative way.  If the person is struggling to find the right words, gently try to help him or her. How to recognize symptoms of stress Symptoms of stress in caregivers include:  Feeling frustrated or angry with the person with dementia.  Denying that the person has dementia or that his or her symptoms will not improve.  Feeling hopeless and unappreciated.  Difficulty sleeping.  Difficulty concentrating.  Feeling anxious, irritable, or depressed.  Developing stress-related health problems.  Feeling like you have too little time for your own life.  Follow these instructions at home:  Make sure that you and the person you are caring for: ? Get regular sleep. ? Exercise regularly. ? Eat regular, nutritious meals. ? Drink enough fluid to keep your urine clear or pale yellow. ? Take over-the-counter and prescription medicines only as told  by your health care providers. ? Attend all scheduled health care appointments.  Join a support group with others who are caregivers.  Ask about respite care resources so that you can have a regular break from the stress of caregiving.  Look for signs of stress in yourself and in the person you are caring for. If you notice  signs of stress, take steps to manage it.  Consider any safety risks and take steps to avoid them.  Organize medications in a pill box for each day of the week.  Create a plan to handle any legal or financial matters. Get legal or financial advice if needed.  Keep a calendar in a central location to remind the person of appointments or other activities. Tips for reducing the risk of injury  Keep floors clear of clutter. Remove rugs, magazine racks, and floor lamps.  Keep hallways well lit, especially at night.  Put a handrail and nonslip mat in the bathtub or shower.  Put childproof locks on cabinets that contain dangerous items, such as medicines, alcohol, guns, toxic cleaning items, sharp tools or utensils, matches, and lighters.  Put the locks in places where the person cannot see or reach them easily. This will help ensure that the person does not wander out of the house and get lost.  Be prepared for emergencies. Keep a list of emergency phone numbers and addresses in a convenient area.  Remove car keys and lock garage doors so that the person does not try to get in the car and drive.  Have the person wear a bracelet that tracks locations and identifies the person as having memory problems. This should be worn at all times for safety. Where to find support: Many individuals and organizations offer support. These include:  Support groups for people with dementia and for caregivers.  Counselors or therapists.  Home health care services.  Adult day care centers.  Where to find more information: Alzheimer's Association: [employer] Contact a health care provider if:  The person's health is rapidly getting worse.  You are no longer able to care for the person.  Caring for the person is affecting your physical and emotional health.  The person threatens himself or herself, you, or anyone else. Summary  Dementia is a term used to describe a number of symptoms that  affect memory and thinking.  Dementia usually gets worse slowly over time.  Take steps to reduce the person's risk of injury, and to plan for future care.  Caregivers need support, relief from caregiving, and time for their own lives. This information is not intended to replace advice given to you by your health care provider. Make sure you discuss any questions you have with your health care provider. Document Released: 05/07/2016 Document Revised: 05/07/2016 Document Reviewed: 05/07/2016 Elsevier Interactive Patient Education  [employer].

## 2018-02-09 NOTE — Progress Notes (Signed)
Subjective:    Patient ID: Carolyn Hernandez, female    DOB: 10/20/1929, 84 y.o.   MRN: [id number]  Carolyn Hernandez is a 84 y.o. female presenting on 02/09/2018 for Diabetes; Hypertension; and Altered Mental Status (concern she could have another UTI)  Patient is accompanied today by her daughter Carolyn Hernandez.  HPI Diabetes Pt presents today for follow up of Type 2 diabetes mellitus. She is not checking CBG at home - No current diabetic medications.  Patient is managing with diet and exercise. - She is not currently symptomatic.  - She denies polydipsia, polyphagia, polyuria, headaches, diaphoresis, shakiness, chills, pain, numbness or tingling in extremities and changes in vision.   - Clinical course has been stable. - She  reports an exercise routine that includes walk through her building, 5-6 days per week. - Her diet is moderate in salt, moderate in fat, and moderate in carbohydrates. - Weight trend: stable  PREVENTION: Eye exam current (within one year): yes Foot exam current (within one year): yes  Lipid/ASCVD risk reduction - on statin: no - patient declines.  Kidney protection - on ace or arb: yes  Recent Labs    03/10/17 1100 10/06/17 1125  HGBA1C 6.1 5.8    Hypertension - She is not checking BP at home or outside of clinic.    - Current medications: amlodipine 10 mg once daily, lisinopril 40 mg once daily, metoprolol tartrate 50 mg twice daily, tolerating well without side effects - She is not currently symptomatic.  Patient does endorse dizziness intermittently over the last 1 week. - Pt denies headache, lightheadedness, changes in vision, chest tightness/pressure, palpitations, leg swelling, sudden loss of speech or loss of consciousness.  Confusion/ Dementia Patient and family are concerned about possible UTI.  Over last week has had incongruence with objects and use (ie telephone for tv remote).   Intermittent lucidity.  Family is afraid for patient to have  future falls.   Patient has known history of dementia.  Is very likely for patient to have progression of symptoms of dementia.  She has started having a friend from church to come and sit with her to help keep her company, help prepare food, clean.  Patient lives in locked interior entry apartment building for seniors.    MMSE - Mini Mental State Exam 02/09/2018  Orientation to time 1  Orientation to Place 3  Registration 3  Attention/ Calculation 0  Recall 1  Language- name 2 objects 2  Language- repeat 0  Language- follow 3 step command 1  Language- read & follow direction 1  Write a sentence 0  Copy design 1  Total score 13    Social History   Tobacco Use  . Smoking status: Never Smoker  . Smokeless tobacco: Never Used  Substance Use Topics  . Alcohol use: No  . Drug use: No    Review of Systems Per HPI unless specifically indicated above     Objective:    BP (!) 135/42 (BP Location: Right Arm, Patient Position: Sitting, Cuff Size: Large)   Pulse 64   Temp 97.7 F (36.5 C) (Oral)   Ht 5\' 1"  (1.549 m)   Wt 165 lb 9.6 oz (75.1 kg)   BMI 31.29 kg/m   Wt Readings from Last 3 Encounters:  02/09/18 165 lb 9.6 oz (75.1 kg)  12/29/17 172 lb 12.8 oz (78.4 kg)  12/24/17 171 lb 15.3 oz (78 kg)    Physical Exam  Constitutional: She is oriented to  person, place, and time. She appears well-developed and well-nourished. No distress.  HENT:  Head: Normocephalic and atraumatic.  Cardiovascular: Normal rate, regular rhythm, S1 normal, S2 normal, normal heart sounds and intact distal pulses.  Pulmonary/Chest: Effort normal and breath sounds normal. No respiratory distress.  Abdominal: Soft. Bowel sounds are normal. She exhibits no distension.  Neurological: She is alert and oriented to person, place, and time.  Skin: Skin is warm and dry. Capillary refill takes less than 2 seconds.  Psychiatric: She has a normal mood and affect. Her behavior is normal. Judgment and thought  content normal.  Vitals reviewed.    Results for orders placed or performed in visit on 44/01/02  BASIC METABOLIC PANEL WITH GFR  Result Value Ref Range   Glucose, Bld 109 (H) 65 - 99 mg/dL   BUN 16 7 - 25 mg/dL   Creat 1.21 (H) 0.60 - 0.88 mg/dL   GFR, Est Non African American 40 (L) > OR = 60 mL/min/1.86m2   GFR, Est African American 47 (L) > OR = 60 mL/min/1.97m2   BUN/Creatinine Ratio 13 6 - 22 (calc)   Sodium 141 135 - 146 mmol/L   Potassium 3.7 3.5 - 5.3 mmol/L   Chloride 110 98 - 110 mmol/L   CO2 17 (L) 20 - 32 mmol/L   Calcium 10.0 8.6 - 10.4 mg/dL      Assessment & Plan:   Problem List Items Addressed This Visit      Cardiovascular and Mediastinum   HTN (hypertension) Controlled hypertension.  BP goal < 130/80.  Pt is working on lifestyle modifications.  Taking medications tolerating well without side effects. Complications: history of acute renal failure, CKD stage 3.  Plan: 1. Continue taking medications without changes 2. Obtain labs  3. Encouraged heart healthy diet and increasing exercise to 30 minutes most days of the week. 4. Check BP 1-2 x per week at home, keep log, and bring to clinic at next appointment. 5. Follow up 6 months.       Other   Moderate single current episode of major depressive disorder (HCC) Stable symptoms.  Patient with improved moods after increased social interaction with her paid sitter and increased time outdoors.  Continue Citalopram without changes.  Followup 6 months.    Other Visit Diagnoses    Type 2 diabetes mellitus without complication, without long-term current use of insulin (Carolyn Hernandez)    -  Primary Stable and well controlled with diet and exercise.  Continue work toward healthy eating.  Followup 6 months.   Relevant Orders   POCT glycosylated hemoglobin (Hb A1C) (Completed)   Confusion state       Relevant Orders   POCT Urinalysis Dipstick (Completed)   Urine Culture   Abnormal urinalysis       Relevant Orders   Urine  Culture    # dementia/confusion Acutely worsening confusion over last week is either progressing dementia or possible sign of UTI. MMSE today supports worsening dementia.  Urinalysis today positive, but may be contaminated.  Patient and family have discussed possible addition of Aricept today.  Will defer until after urine culture.  Provided family with [place] information, instruction to unplug stove if concerned for fire safety.  Verbalize understanding.  Follow up plan: Return in about 6 months (around 08/12/2018) for hypertension, diabetes, dementia.  [name], DNP, AGPCNP-BC Adult Gerontology Primary Care Nurse Practitioner [facility] 02/09/2018, 11:21 AM

## 2018-02-11 LAB — URINE CULTURE
MICRO NUMBER:: 91016652
SPECIMEN QUALITY:: ADEQUATE

## 2018-02-11 MED ORDER — DONEPEZIL HCL 5 MG PO TABS: 5.0000 mg | ORAL_TABLET | Freq: Every day | ORAL | 6 refills | Status: DC

## 2018-02-11 NOTE — Addendum Note (Signed)
Addended by: Cleaster Corin on: 02/11/2018 07:51 AM   Modules accepted: Orders

## 2018-03-23 DIAGNOSIS — H90A21 Sensorineural hearing loss, unilateral, right ear, with restricted hearing on the contralateral side: Secondary | ICD-10-CM | POA: Diagnosis not present

## 2018-03-31 ENCOUNTER — Encounter: Payer: Self-pay | Admitting: Nurse Practitioner

## 2018-07-06 ENCOUNTER — Other Ambulatory Visit: Payer: Self-pay | Admitting: Nurse Practitioner

## 2018-07-06 DIAGNOSIS — I10 Essential (primary) hypertension: Secondary | ICD-10-CM

## 2018-08-03 ENCOUNTER — Other Ambulatory Visit: Payer: Self-pay | Admitting: Nurse Practitioner

## 2018-08-03 DIAGNOSIS — F0280 Dementia in other diseases classified elsewhere without behavioral disturbance: Secondary | ICD-10-CM

## 2018-08-03 DIAGNOSIS — G301 Alzheimer's disease with late onset: Principal | ICD-10-CM

## 2018-08-10 ENCOUNTER — Ambulatory Visit (INDEPENDENT_AMBULATORY_CARE_PROVIDER_SITE_OTHER): Payer: Medicare Other | Admitting: Nurse Practitioner

## 2018-08-10 ENCOUNTER — Other Ambulatory Visit: Payer: Self-pay

## 2018-08-10 ENCOUNTER — Encounter: Payer: Self-pay | Admitting: Nurse Practitioner

## 2018-08-10 VITALS — BP 155/79 | HR 109 | Temp 97.9°F | Ht 61.0 in | Wt 156.4 lb

## 2018-08-10 DIAGNOSIS — E119 Type 2 diabetes mellitus without complications: Secondary | ICD-10-CM | POA: Diagnosis not present

## 2018-08-10 DIAGNOSIS — F0280 Dementia in other diseases classified elsewhere without behavioral disturbance: Secondary | ICD-10-CM

## 2018-08-10 DIAGNOSIS — G301 Alzheimer's disease with late onset: Secondary | ICD-10-CM | POA: Diagnosis not present

## 2018-08-10 DIAGNOSIS — M170 Bilateral primary osteoarthritis of knee: Secondary | ICD-10-CM

## 2018-08-10 DIAGNOSIS — I10 Essential (primary) hypertension: Secondary | ICD-10-CM | POA: Diagnosis not present

## 2018-08-10 DIAGNOSIS — F321 Major depressive disorder, single episode, moderate: Secondary | ICD-10-CM

## 2018-08-10 LAB — POCT GLYCOSYLATED HEMOGLOBIN (HGB A1C): Hemoglobin A1C: 5.8 % — AB (ref 4.0–5.6)

## 2018-08-10 MED ORDER — DONEPEZIL HCL 5 MG PO TABS: 5.0000 mg | ORAL_TABLET | Freq: Every day | ORAL | 1 refills | Status: DC

## 2018-08-10 MED ORDER — LISINOPRIL 40 MG PO TABS: 40.0000 mg | ORAL_TABLET | Freq: Every day | ORAL | 1 refills | Status: DC

## 2018-08-10 MED ORDER — CITALOPRAM HYDROBROMIDE 20 MG PO TABS: 20.0000 mg | ORAL_TABLET | Freq: Every day | ORAL | 3 refills | Status: DC

## 2018-08-10 MED ORDER — AMLODIPINE BESYLATE 10 MG PO TABS: 10.0000 mg | ORAL_TABLET | Freq: Every day | ORAL | 3 refills | Status: DC

## 2018-08-10 MED ORDER — DICLOFENAC SODIUM 1 % TD GEL: 2.0000 g | Freq: Four times a day (QID) | TRANSDERMAL | 11 refills | Status: DC

## 2018-08-10 MED ORDER — METOPROLOL TARTRATE 50 MG PO TABS: 50.0000 mg | ORAL_TABLET | Freq: Two times a day (BID) | ORAL | 3 refills | Status: DC

## 2018-08-10 MED ORDER — HYDROCHLOROTHIAZIDE 12.5 MG PO TABS: 12.5000 mg | ORAL_TABLET | Freq: Every day | ORAL | 5 refills | Status: DC

## 2018-08-10 NOTE — Patient Instructions (Signed)
Carolyn Hernandez,   Thank you for coming in to clinic today.  1. Continue current blood pressure medications. - START hydrochlorothiazide 12.5 mg once daily.  This will help with blood pressure and fluid. - Goal BP is less than 140/80  2. START wearing compression socks for leg swelling. - 8-15 mmHg compression strength.  - take off every night.  Please schedule a follow-up appointment with [name], AGNP. Return in about 6 weeks (around 09/21/2018) for hypertension AND in about 3 weeks at [facility] for repeat labs.  Call clinic to send order over about 1 day before you go.  If you have any other questions or concerns, please feel free to call the clinic or send a message through [place]. You may also schedule an earlier appointment if necessary.  You will receive a survey after today's visit either digitally by e-mail or paper by [employer]. Your experiences and feedback matter to [place].  Please respond so we know how we are doing as we provide care for you.   [name], DNP, AGNP-BC Adult Gerontology Nurse Practitioner [place]

## 2018-08-10 NOTE — Progress Notes (Signed)
Subjective:    Patient ID: Carolyn Hernandez, female    DOB: July 27, 1929, 85 y.o.   MRN: [id number]  Carolyn Hernandez is a 85 y.o. female presenting on 08/10/2018 for Dementia (the pt daughter states they have notice improvement since starting the medication); Diabetes; and Hypertension Patient is accompanied today by her daughter.  HPI Diabetes Pt presents today for follow up of Type 2 diabetes mellitus, currently diet and lifestyle controlled. She is not checking CBG at home - Current diabetic medications include: none - She is not currently symptomatic.  - She denies polydipsia, polyphagia, polyuria, headaches, diaphoresis, shakiness, chills, pain, numbness or tingling in extremities and changes in vision.   - Clinical course has been stable. - She  reports an exercise routine that includes walking for about 15 minutes, 2-3 days per week. - Her diet is moderate in salt, moderate in fat, and moderate in carbohydrates. - Weight trend: stable  PREVENTION: Eye exam current (within one year): no Foot exam current (within one year): yes  Lipid/ASCVD risk reduction - on statin: none - patient refuses Kidney protection - on ace or arb: lisinopril Recent Labs    10/06/17 1125 02/09/18 1128  HGBA1C 5.8 5.8*   Hypertension - She is not checking BP at home or outside of clinic.     - Patient has fear of falling when getting to the doctor's office. - Current medications: amlodipine 10 and lisinopril 40, metoprolol 50 mg bid, tolerating well without side effects - She is not currently symptomatic except for increase in leg swelling and nocturia (likely due to dependent edema return to heart). - Pt denies headache, lightheadedness, dizziness, changes in vision, chest tightness/pressure, palpitations, leg swelling, sudden loss of speech or loss of consciousness. - Repeat 188/82  - Patient stopped diuretic several months ago - no recall why.  Patient is having worsening leg swelling.   Continues to complains of nocturia even with stopping furosemide - no difficulty getting to restroom.  Dementia Patient is noting improvement on donepezil.  Patient has regular meals 2-3 times per week with her son and daughter-in-law.  Sat/Sunday [name] will help carry meals.  Patient is able to heat these things in microwave.  Ander Gaster is unplugged. - Patient has reduced to 2 bottles of water per day to reduce night time urination - Memory has not returned to normal, but daughter notes patient is no longer using telephone as a TV remote.  Can operate her TV normally.  Has radio that she can continue to use.  No fight on self care/bathing.  Social History   Tobacco Use  . Smoking status: Never Smoker  . Smokeless tobacco: Never Used  Substance Use Topics  . Alcohol use: No  . Drug use: No    Review of Systems Per HPI unless specifically indicated above     Objective:    BP (!) 155/79 (BP Location: Right Arm, Patient Position: Sitting, Cuff Size: Large)   Pulse (!) 109   Temp 97.9 F (36.6 C) (Oral)   Ht 5\' 1"  (1.549 m)   Wt 156 lb 6.4 oz (70.9 kg)   BMI 29.55 kg/m   Wt Readings from Last 3 Encounters:  08/10/18 156 lb 6.4 oz (70.9 kg)  02/09/18 165 lb 9.6 oz (75.1 kg)  12/29/17 172 lb 12.8 oz (78.4 kg)    Physical Exam Vitals signs reviewed.  Constitutional:      General: She is awake. She is not in acute distress.  Appearance: She is well-developed.  HENT:     Head: Normocephalic and atraumatic.  Neck:     Musculoskeletal: Normal range of motion and neck supple.     Vascular: No carotid bruit.  Cardiovascular:     Rate and Rhythm: Tachycardia present. Rhythm irregularly irregular.     Pulses:          Radial pulses are 2+ on the right side and 2+ on the left side.       Posterior tibial pulses are 1+ on the right side and 1+ on the left side.     Heart sounds: Normal heart sounds, S1 normal and S2 normal.  Pulmonary:     Effort: Pulmonary effort is normal. No  respiratory distress.     Breath sounds: Normal breath sounds and air entry.  Abdominal:     General: Bowel sounds are normal.     Palpations: Abdomen is soft.  Musculoskeletal:     Right lower leg: 2+ Edema present.     Left lower leg: 3+ Edema present.  Skin:    General: Skin is warm and dry.  Neurological:     Mental Status: She is alert and oriented to person, place, and time.  Psychiatric:        Attention and Perception: Attention normal.        Mood and Affect: Mood and affect normal.        Behavior: Behavior normal. Behavior is cooperative.        Thought Content: Thought content normal.        Judgment: Judgment normal.    Results for orders placed or performed in visit on 08/10/18  POCT glycosylated hemoglobin (Hb A1C)  Result Value Ref Range   Hemoglobin A1C 5.8 (A) 4.0 - 5.6 %   HbA1c POC (<> result, manual entry)     HbA1c, POC (prediabetic range)     HbA1c, POC (controlled diabetic range)        Assessment & Plan:   Problem List Items Addressed This Visit      Cardiovascular and Mediastinum   HTN (hypertension) Elevated and uncontrolled hypertension.  BP goal < 130/80.  Pt is working on lifestyle modifications.  Taking medications tolerating well without side effects, but recently stopped furosemide due to potassium problems or nocturia.  Patient was not directed to do this medically.  Patient's daughter-in-law who fills pill box stopped it several months ago.   Plan: 1. Continue taking Current meds without changes - RESTARt diuretic.  Change from furosemide to HCTZ 12.5 mg once daily. 2. Obtain labs   3. Encouraged heart healthy diet and increasing exercise to 30 minutes most days of the week. 4. Check BP 1-2 x per week at home, keep log, and bring to clinic at next appointment. 5. Follow up 6 weeks.     Relevant Medications   hydrochlorothiazide (HYDRODIURIL) 12.5 MG tablet   metoprolol tartrate (LOPRESSOR) 50 MG tablet   lisinopril (PRINIVIL,ZESTRIL)  40 MG tablet   amLODipine (NORVASC) 10 MG tablet   Other Relevant Orders   BASIC METABOLIC PANEL WITH GFR (Completed)   Basic Metabolic Panel (BMET)     Nervous and Auditory   Dementia ([place]) Stable to mildly improved on donepezil.  Patient continues to need sitter/caregiver a few days per week and to help with bathing.  iADLs are assisted also by family.  Patient independent with toileting, feeding.  Needs assistance with bathing, dressing.  Plan: 1. Continue donepezil 5 mg once daily  2. Continue concurrent management of depression 3. Continue regular physical and social activity at senior apartment. 4. Follow-up 6 months.   Relevant Medications   donepezil (ARICEPT) 5 MG tablet   citalopram (CELEXA) 20 MG tablet     Other   Primary osteoarthritis of both knees Stable without significant change.  Continue diclofenac gel.  Refill sent.  Follow-up 3-6 months.   Relevant Medications   diclofenac sodium (VOLTAREN) 1 % GEL   Moderate single current episode of major depressive disorder ([place]) Stable today on exam.  Medications tolerated without side effects.  Continue at current doses.  Refills provided.  Check labs today. Followup 6 months.    Relevant Medications   citalopram (CELEXA) 20 MG tablet    Other Visit Diagnoses    Type 2 diabetes mellitus without complication, without long-term current use of insulin ([place])    -  Primary Well-controlled diet and lifestyle managed T2DM.  stable from prior value and goal A1c < 7.0%. - No known complications or hypoglycemia.  Plan:  1. Continue lifestyle management 2. Encourage improved lifestyle: - low carb/low glycemic diet reinforced prior education - Increase physical activity to 30 minutes most days of the week.  Explained that increased physical activity increases body's use of sugar for energy. 3. Labs today 4. Continue ACEi 5. Advised to schedule DM ophtho exam, send record. 6. Follow-up 6 months.    Relevant Medications    lisinopril (PRINIVIL,ZESTRIL) 40 MG tablet   Other Relevant Orders   POCT glycosylated hemoglobin (Hb A1C) (Completed)       Meds ordered this encounter  Medications  . hydrochlorothiazide (HYDRODIURIL) 12.5 MG tablet    Sig: Take 1 tablet (12.5 mg total) by mouth daily.    Dispense:  30 tablet    Refill:  5    Order Specific Question:   Supervising Provider    Answer:   Olin Hauser [[id number]]  . metoprolol tartrate (LOPRESSOR) 50 MG tablet    Sig: Take 1 tablet (50 mg total) by mouth 2 (two) times daily.    Dispense:  180 tablet    Refill:  3    Order Specific Question:   Supervising Provider    Answer:   Olin Hauser [[id number]]  . lisinopril (PRINIVIL,ZESTRIL) 40 MG tablet    Sig: Take 1 tablet (40 mg total) by mouth daily.    Dispense:  90 tablet    Refill:  1    FOR NEXT FILL. [place] YOU    Order Specific Question:   Supervising Provider    Answer:   Olin Hauser [[id number]]  . diclofenac sodium (VOLTAREN) 1 % GEL    Sig: Apply 2 g topically 4 (four) times daily.    Dispense:  100 g    Refill:  11    Order Specific Question:   Supervising Provider    Answer:   Olin Hauser [[id number]]  . donepezil (ARICEPT) 5 MG tablet    Sig: Take 1 tablet (5 mg total) by mouth at bedtime.    Dispense:  90 tablet    Refill:  1    Order Specific Question:   Supervising Provider    Answer:   Olin Hauser [[id number]]  . citalopram (CELEXA) 20 MG tablet    Sig: Take 1 tablet (20 mg total) by mouth daily.    Dispense:  90 tablet    Refill:  3    Order Specific Question:   Supervising Provider  Answer:   Olin Hauser [[id number]]  . amLODipine (NORVASC) 10 MG tablet    Sig: Take 1 tablet (10 mg total) by mouth daily.    Dispense:  90 tablet    Refill:  3    Order Specific Question:   Supervising Provider    Answer:   Olin Hauser [[id number]]    Follow up plan: Return in about 6 weeks (around 09/21/2018) for hypertension AND in  about 3 weeks at [place] for repeat labs.  [name], DNP, AGPCNP-BC Adult Gerontology Primary Care Nurse Practitioner [facility] 08/10/2018, 10:18 AM

## 2018-08-11 LAB — BASIC METABOLIC PANEL WITH GFR
BUN/Creatinine Ratio: 19 (calc) (ref 6–22)
BUN: 21 mg/dL (ref 7–25)
CO2: 23 mmol/L (ref 20–32)
Calcium: 10 mg/dL (ref 8.6–10.4)
Chloride: 111 mmol/L — ABNORMAL HIGH (ref 98–110)
Creat: 1.13 mg/dL — ABNORMAL HIGH (ref 0.60–0.88)
GFR, Est African American: 51 mL/min/{1.73_m2} — ABNORMAL LOW (ref >=60)
GFR, Est Non African American: 44 mL/min/{1.73_m2} — ABNORMAL LOW (ref >=60)
Glucose, Bld: 110 mg/dL — ABNORMAL HIGH (ref 65–99)
Potassium: 4.6 mmol/L (ref 3.5–5.3)
Sodium: 144 mmol/L (ref 135–146)

## 2018-08-15 ENCOUNTER — Encounter: Payer: Self-pay | Admitting: Nurse Practitioner

## 2018-09-21 ENCOUNTER — Ambulatory Visit: Payer: Medicare Other | Admitting: Nurse Practitioner

## 2018-11-05 ENCOUNTER — Ambulatory Visit: Payer: Self-pay | Admitting: *Deleted

## 2018-11-05 NOTE — Chronic Care Management (AMB) (Signed)
Chronic Care Management   Note  11/05/2018 Name: Carolyn Hernandez MRN: [id number] DOB: 1930-02-19  Carolyn Hernandez is a 85 y.o. year old female who is a primary care patient of [employer] [employer], Carolyn Hernandez. I reached out to Carolyn Hernandez by phone today in response to a referral sent by Carolyn Hernandez's health plan.    Carolyn Hernandez was given information about Chronic Care Management services today including:  1. CCM service includes personalized support from designated clinical staff supervised by her physician, including individualized plan of care and coordination with other care providers 2. 24/7 contact phone numbers for assistance for urgent and routine care needs. 3. Service will only be billed when office clinical staff spend 20 minutes or more in a month to coordinate care. 4. Only one practitioner may furnish and bill the service in a calendar month. 5. The patient may stop CCM services at any time (effective at the end of the month) by phone call to the office staff. 6. The patient will be responsible for cost sharing (co-pay) of up to 20% of the service fee (after annual deductible is met).  Patient and her daughter Carolyn Hernandez did not agree to services and wishes to consider information provided before deciding about enrollment in CCM services.   Follow up plan: Carolyn Hernandez, has been provided with contact information for the chronic care management team and has been advised to call with any health related questions or concerns.   [place]  [email or web address]   ??[phone]

## 2018-12-03 ENCOUNTER — Other Ambulatory Visit: Payer: Self-pay | Admitting: Nurse Practitioner

## 2018-12-03 DIAGNOSIS — F0280 Dementia in other diseases classified elsewhere without behavioral disturbance: Secondary | ICD-10-CM

## 2018-12-31 ENCOUNTER — Other Ambulatory Visit: Payer: Self-pay | Admitting: Nurse Practitioner

## 2018-12-31 DIAGNOSIS — I10 Essential (primary) hypertension: Secondary | ICD-10-CM

## 2019-05-31 ENCOUNTER — Other Ambulatory Visit: Payer: Self-pay | Admitting: Family Medicine

## 2019-05-31 DIAGNOSIS — G301 Alzheimer's disease with late onset: Secondary | ICD-10-CM

## 2019-05-31 DIAGNOSIS — F0280 Dementia in other diseases classified elsewhere without behavioral disturbance: Secondary | ICD-10-CM

## 2019-05-31 DIAGNOSIS — I10 Essential (primary) hypertension: Secondary | ICD-10-CM

## 2019-07-19 ENCOUNTER — Other Ambulatory Visit: Payer: Self-pay | Admitting: Family Medicine

## 2019-07-19 DIAGNOSIS — I10 Essential (primary) hypertension: Secondary | ICD-10-CM

## 2019-09-03 ENCOUNTER — Encounter: Payer: Self-pay | Admitting: Family Medicine

## 2019-09-03 ENCOUNTER — Ambulatory Visit (INDEPENDENT_AMBULATORY_CARE_PROVIDER_SITE_OTHER): Payer: Medicare Other | Admitting: Family Medicine

## 2019-09-03 ENCOUNTER — Other Ambulatory Visit: Payer: Self-pay

## 2019-09-03 VITALS — BP 158/70 | HR 61 | Temp 97.1°F | Ht 61.0 in | Wt 175.0 lb

## 2019-09-03 DIAGNOSIS — I10 Essential (primary) hypertension: Secondary | ICD-10-CM

## 2019-09-03 DIAGNOSIS — R7989 Other specified abnormal findings of blood chemistry: Secondary | ICD-10-CM

## 2019-09-03 DIAGNOSIS — E119 Type 2 diabetes mellitus without complications: Secondary | ICD-10-CM | POA: Diagnosis not present

## 2019-09-03 LAB — POCT URINALYSIS DIPSTICK
Bilirubin, UA: NEGATIVE
Blood, UA: NEGATIVE
Glucose, UA: NEGATIVE
Ketones, UA: NEGATIVE
Leukocytes, UA: NEGATIVE
Nitrite, UA: NEGATIVE
Protein, UA: NEGATIVE
Spec Grav, UA: 1.015 (ref 1.010–1.025)
Urobilinogen, UA: 0.2 E.U./dL
pH, UA: 5 (ref 5.0–8.0)

## 2019-09-03 LAB — POCT UA - MICROALBUMIN: Microalbumin Ur, POC: NEGATIVE mg/L

## 2019-09-03 NOTE — Assessment & Plan Note (Signed)
Labs ordered today for evaluation of thyroid function.  Will call with results.  Currently without any medication/treatment.

## 2019-09-03 NOTE — Assessment & Plan Note (Signed)
DM with A1c unknown - will have labs drawn today  Plan:  1. No current therapy, will continue to follow low glycemic index diet 2. Encourage improved lifestyle: - low carb/low glycemic diet reinforced prior education - Increase physical activity to 30 minutes most days of the week and with social interactions planned at the [facility] Explained that increased physical activity increases body's use of sugar for energy. 3. Check fasting am CBG and record.  Bring log to next visit for review 4. Continue ACEi 5. Follow-up in 3 months for next re-evaluation

## 2019-09-03 NOTE — Progress Notes (Signed)
Subjective:    Patient ID: Carolyn Hernandez, female    DOB: 1930/03/13, 86 y.o.   MRN: [id number]  Carolyn Hernandez is a 86 y.o. female presenting on 09/03/2019 for Consult (FL-2 form for [facility] ) and Altered Mental Status (pt daughter-n-law states that the patient confusion and mobility seems to be worsening over the past year. She is currently in assisted living, but they are looking to move her into something more structure)   HPI  Carolyn Hernandez presents to clinic with daughter in law for [facility] form to admission to [place].  Daughter in law states, that Carolyn Hernandez has continued to be confused, that she was having assistance with her medications daily with someone coming over to help, but lately that help hasn't been as frequent so she is unsure if she has been taking her medications as prescribed.  Will restart all of her medications as prescribed and submit form to [facility] today.  Will notify us if accepted there or if will be needing a new FL2 form for another SNF.  Denies any new concerns or refills needed today.  Depression screen [facility] 2/9 09/03/2019 02/09/2018 10/06/2017  Decreased Interest 0 1 0  Down, Depressed, Hopeless 0 1 0  PHQ - 2 Score 0 2 0  Altered sleeping 0 0 0  Tired, decreased energy 1 1 0  Change in appetite 0 0 0  Feeling bad or failure about yourself  0 0 0  Trouble concentrating 0 0 0  Moving slowly or fidgety/restless 1 0 0  Suicidal thoughts 0 0 0  PHQ-9 Score 2 3 0  Difficult doing work/chores Not difficult at all Not difficult at all Not difficult at all    Social History   Tobacco Use  . Smoking status: Never Smoker  . Smokeless tobacco: Never Used  Substance Use Topics  . Alcohol use: No  . Drug use: No    Review of Systems  Unable to perform ROS: Dementia (Daughter in law with patient today - denies concerns)   Per HPI unless specifically indicated above     Objective:    BP (!) 158/70 (BP Location:  Right Arm, Patient Position: Sitting, Cuff Size: Large)   Pulse 61   Temp (!) 97.1 F (36.2 C) (Temporal)   Ht 5\' 1"  (1.549 m)   Wt 175 lb (79.4 kg)   BMI 33.07 kg/m   Wt Readings from Last 3 Encounters:  09/03/19 175 lb (79.4 kg)  08/10/18 156 lb 6.4 oz (70.9 kg)  02/09/18 165 lb 9.6 oz (75.1 kg)    Physical Exam Vitals reviewed.  Constitutional:      General: She is not in acute distress.    Appearance: Normal appearance. She is obese. She is not ill-appearing or toxic-appearing.  HENT:     Head: Normocephalic.  Eyes:     General: Lids are normal. Vision grossly intact.        Right eye: No discharge.        Left eye: No discharge.     Extraocular Movements: Extraocular movements intact.     Conjunctiva/sclera: Conjunctivae normal.     Pupils: Pupils are equal, round, and reactive to light.  Cardiovascular:     Rate and Rhythm: Normal rate and regular rhythm.     Pulses: Normal pulses.          Dorsalis pedis pulses are 2+ on the right side and 2+ on the left  side.       Posterior tibial pulses are 2+ on the right side and 2+ on the left side.     Heart sounds: Normal heart sounds. No murmur. No friction rub. No gallop.   Pulmonary:     Effort: Pulmonary effort is normal. No respiratory distress.     Breath sounds: Normal breath sounds.  Abdominal:     General: Abdomen is flat. Bowel sounds are normal. There is no distension.     Palpations: Abdomen is soft.  Musculoskeletal:     Right lower leg: No edema.     Left lower leg: No edema.  Feet:     Right foot:     Skin integrity: Skin integrity normal.     Left foot:     Skin integrity: Skin integrity normal.  Skin:    General: Skin is warm and dry.     Capillary Refill: Capillary refill takes less than 2 seconds.  Neurological:     General: No focal deficit present.     Mental Status: She is alert.     Cranial Nerves: Cranial nerves are intact.     Sensory: Sensation is intact.     Comments: In wheelchair,  unable to ambulate in exam room  Psychiatric:        Attention and Perception: Perception normal.        Mood and Affect: Mood normal.        Speech: Speech normal.        Behavior: Behavior is cooperative.        Thought Content: Thought content normal.        Cognition and Memory: Cognition is impaired. Memory is impaired. She exhibits impaired recent memory and impaired remote memory.    Results for orders placed or performed in visit on 82/99/37  BASIC METABOLIC PANEL WITH GFR  Result Value Ref Range   Glucose, Bld 110 (H) 65 - 99 mg/dL   BUN 21 7 - 25 mg/dL   Creat 1.13 (H) 0.60 - 0.88 mg/dL   GFR, Est Non African American 44 (L) > OR = 60 mL/min/1.6m2   GFR, Est African American 51 (L) > OR = 60 mL/min/1.23m2   BUN/Creatinine Ratio 19 6 - 22 (calc)   Sodium 144 135 - 146 mmol/L   Potassium 4.6 3.5 - 5.3 mmol/L   Chloride 111 (H) 98 - 110 mmol/L   CO2 23 20 - 32 mmol/L   Calcium 10.0 8.6 - 10.4 mg/dL  POCT glycosylated hemoglobin (Hb A1C)  Result Value Ref Range   Hemoglobin A1C 5.8 (A) 4.0 - 5.6 %   HbA1c POC (<> result, manual entry)     HbA1c, POC (prediabetic range)     HbA1c, POC (controlled diabetic range)        Assessment & Plan:   Problem List Items Addressed This Visit      Cardiovascular and Mediastinum   HTN (hypertension) - Primary    BP elevated in clinic today, but daughter in law is unsure when she has taken her medications last.  No record of home BP readings to discuss/review.  Plan: 1) Continue medications as prescribed, with assistance from daughter in law or home health staff until accepted into SNF and then SNF will manage 2) Have labs drawn today and we will contact with results. 3) Reviewed BP goal of less than 140/90 and to contact if consistently above 4) Follow up in 3 months, sooner should the need arise.  Relevant Orders   CBC with Differential   COMPLETE METABOLIC PANEL WITH GFR     Endocrine   Diabetes mellitus type II,  controlled, with no complications ([place])    DM with A1c unknown - will have labs drawn today  Plan:  1. No current therapy, will continue to follow low glycemic index diet 2. Encourage improved lifestyle: - low carb/low glycemic diet reinforced prior education - Increase physical activity to 30 minutes most days of the week and with social interactions planned at the SNF Explained that increased physical activity increases body's use of sugar for energy. 3. Check fasting am CBG and record.  Bring log to next visit for review 4. Continue ACEi 5. Follow-up in 3 months for next re-evaluation      Relevant Orders   CBC with Differential   COMPLETE METABOLIC PANEL WITH GFR   HgB A1c     Other   Low TSH level    Labs ordered today for evaluation of thyroid function.  Will call with results.  Currently without any medication/treatment.      Relevant Orders   Thyroid Panel With TSH      No orders of the defined types were placed in this encounter.     Follow up plan: Return in about 3 months (around 12/04/2019) for DM F/U if not in SNF by then.   Harlin Rain, [place] Family Nurse Practitioner [facility] 09/03/2019, 11:48 AM

## 2019-09-03 NOTE — Assessment & Plan Note (Signed)
BP elevated in clinic today, but daughter in law is unsure when she has taken her medications last.  No record of home BP readings to discuss/review.  Plan: 1) Continue medications as prescribed, with assistance from daughter in law or home health staff until accepted into SNF and then SNF will manage 2) Have labs drawn today and we will contact with results. 3) Reviewed BP goal of less than 140/90 and to contact if consistently above 4) Follow up in 3 months, sooner should the need arise.

## 2019-09-03 NOTE — Addendum Note (Signed)
Addended by: Wilson Singer on: 09/03/2019 02:15 PM   Modules accepted: Orders

## 2019-09-03 NOTE — Patient Instructions (Signed)
As we discussed, have labs completed today and we will contact with results.  FL2 form completed today for [facility] and if needed we will complete for other SNF's you are requesting admission to.  Please contact us once admitted to SNF to find out if they are taking over care and medication management or if we will be needing to see her every 3-6 months for medications and re-evaluations.  We are not making changes to blood pressure medications, as we discussed, since we are unsure if she has been taking them.  Begin taking medications as prescribed and if blood pressures are greater than 140/90 to contact us so we can discuss dose adjustments.  We will plan to see you back in 3 months for follow up on hypertension and diabetes  You will receive a survey after today's visit either digitally by e-mail or paper by [employer]. Your experiences and feedback matter to [place].  Please respond so we know how we are doing as we provide care for you.  Call us with any questions/concerns/needs.  It is my goal to be available to you for your health concerns.  Thanks for choosing me to be a partner in your healthcare needs!  Harlin Rain, FNP-C Family Nurse Practitioner [facility] Phone: [phone]

## 2019-09-04 LAB — CBC WITH DIFFERENTIAL/PLATELET
Absolute Monocytes: 774 cells/uL (ref 200–950)
Basophils Absolute: 55 cells/uL (ref 0–200)
Basophils Relative: 0.5 %
Eosinophils Absolute: 316 cells/uL (ref 15–500)
Eosinophils Relative: 2.9 %
HCT: 37.7 % (ref 35.0–45.0)
Hemoglobin: 12.4 g/dL (ref 11.7–15.5)
Lymphs Abs: 2442 cells/uL (ref 850–3900)
MCH: 30 pg (ref 27.0–33.0)
MCHC: 32.9 g/dL (ref 32.0–36.0)
MCV: 91.3 fL (ref 80.0–100.0)
MPV: 10.3 fL (ref 7.5–12.5)
Monocytes Relative: 7.1 %
Neutro Abs: 7314 cells/uL (ref 1500–7800)
Neutrophils Relative %: 67.1 %
Platelets: 364 10*3/uL (ref 140–400)
RBC: 4.13 10*6/uL (ref 3.80–5.10)
RDW: 12.6 % (ref 11.0–15.0)
Total Lymphocyte: 22.4 %
WBC: 10.9 10*3/uL — ABNORMAL HIGH (ref 3.8–10.8)

## 2019-09-04 LAB — THYROID PANEL WITH TSH
Free Thyroxine Index: 2.4 (ref 1.4–3.8)
T3 Uptake: 31 % (ref 22–35)
T4, Total: 7.7 ug/dL (ref 5.1–11.9)
TSH: 0.05 mIU/L — ABNORMAL LOW (ref 0.40–4.50)

## 2019-09-04 LAB — HEMOGLOBIN A1C
Hgb A1c MFr Bld: 6 % of total Hgb — ABNORMAL HIGH (ref <5.7)
Mean Plasma Glucose: 126 (calc)
eAG (mmol/L): 7 (calc)

## 2019-09-04 LAB — COMPLETE METABOLIC PANEL WITH GFR
AG Ratio: 1.3 (calc) (ref 1.0–2.5)
ALT: 11 U/L (ref 6–29)
AST: 15 U/L (ref 10–35)
Albumin: 3.8 g/dL (ref 3.6–5.1)
Alkaline phosphatase (APISO): 87 U/L (ref 37–153)
BUN/Creatinine Ratio: 23 (calc) — ABNORMAL HIGH (ref 6–22)
BUN: 38 mg/dL — ABNORMAL HIGH (ref 7–25)
CO2: 22 mmol/L (ref 20–32)
Calcium: 9.9 mg/dL (ref 8.6–10.4)
Chloride: 106 mmol/L (ref 98–110)
Creat: 1.63 mg/dL — ABNORMAL HIGH (ref 0.60–0.88)
GFR, Est African American: 32 mL/min/{1.73_m2} — ABNORMAL LOW (ref >=60)
GFR, Est Non African American: 28 mL/min/{1.73_m2} — ABNORMAL LOW (ref >=60)
Globulin: 2.9 g/dL (calc) (ref 1.9–3.7)
Glucose, Bld: 111 mg/dL (ref 65–139)
Potassium: 4.1 mmol/L (ref 3.5–5.3)
Sodium: 138 mmol/L (ref 135–146)
Total Bilirubin: 0.3 mg/dL (ref 0.2–1.2)
Total Protein: 6.7 g/dL (ref 6.1–8.1)

## 2019-09-06 ENCOUNTER — Other Ambulatory Visit: Payer: Self-pay | Admitting: Family Medicine

## 2019-09-06 DIAGNOSIS — N179 Acute kidney failure, unspecified: Secondary | ICD-10-CM

## 2019-09-13 ENCOUNTER — Ambulatory Visit: Payer: Medicare Other

## 2019-09-27 ENCOUNTER — Other Ambulatory Visit: Payer: Self-pay | Admitting: Nurse Practitioner

## 2019-09-27 ENCOUNTER — Other Ambulatory Visit: Payer: Self-pay | Admitting: Family Medicine

## 2019-09-27 DIAGNOSIS — I10 Essential (primary) hypertension: Secondary | ICD-10-CM

## 2019-09-29 NOTE — Telephone Encounter (Signed)
Per notes last visit-3/19- follow up in 3 months for BP- RF per protocol

## 2019-09-30 ENCOUNTER — Telehealth: Payer: Self-pay

## 2019-09-30 NOTE — Telephone Encounter (Signed)
Copied from [place] [phone]. Topic: General - Other >> Sep 30, 2019  8:38 AM Alanda Slim E wrote: Reason for CRM: Pts daughter would like the [facility] form info changed to skilled nursing instead of assisted living/due to another place with memory care/ please advise   I spoke with Jiles Garter and notified her that a copy of the form is left up front for her to pick up.

## 2019-10-08 ENCOUNTER — Telehealth: Payer: Self-pay | Admitting: Family Medicine

## 2019-10-08 ENCOUNTER — Encounter: Payer: Self-pay | Admitting: Family Medicine

## 2019-10-08 NOTE — Progress Notes (Signed)
New letter written, at request of Daughter, that patient needs higher level of care with a facility that has memory care options.

## 2019-10-08 NOTE — Telephone Encounter (Signed)
Is this a follow up to the [facility] 2 form?  Thanks

## 2019-10-08 NOTE — Telephone Encounter (Signed)
Pt daughter Margaretha Sheffield called back and asked that message about the security deposit be disregarded. Margaretha Sheffield stated pt needs to be moved to a facility that offers memory care and requests that the letter reflect this.

## 2019-10-08 NOTE — Telephone Encounter (Signed)
Pt daughter elaine is trying to get back the security deposit back and needs a letter for her PCP stating pt needs to be moving to different facility due to memory issues and needs to be in memory care unit. [facility]n tamara [facility] . Pt daughter will pick up the letter asap

## 2019-10-19 DIAGNOSIS — E119 Type 2 diabetes mellitus without complications: Secondary | ICD-10-CM | POA: Diagnosis not present

## 2019-10-19 DIAGNOSIS — H3530 Unspecified macular degeneration: Secondary | ICD-10-CM | POA: Diagnosis not present

## 2019-10-19 DIAGNOSIS — R531 Weakness: Secondary | ICD-10-CM | POA: Diagnosis not present

## 2019-10-19 DIAGNOSIS — I10 Essential (primary) hypertension: Secondary | ICD-10-CM | POA: Diagnosis not present

## 2019-10-28 DIAGNOSIS — R531 Weakness: Secondary | ICD-10-CM | POA: Diagnosis not present

## 2019-10-28 DIAGNOSIS — H3530 Unspecified macular degeneration: Secondary | ICD-10-CM | POA: Diagnosis not present

## 2019-10-28 DIAGNOSIS — E119 Type 2 diabetes mellitus without complications: Secondary | ICD-10-CM | POA: Diagnosis not present

## 2019-10-28 DIAGNOSIS — I10 Essential (primary) hypertension: Secondary | ICD-10-CM | POA: Diagnosis not present

## 2019-10-28 DIAGNOSIS — I69311 Memory deficit following cerebral infarction: Secondary | ICD-10-CM | POA: Diagnosis not present

## 2019-10-28 DIAGNOSIS — R2681 Unsteadiness on feet: Secondary | ICD-10-CM | POA: Diagnosis not present

## 2019-11-01 DIAGNOSIS — E119 Type 2 diabetes mellitus without complications: Secondary | ICD-10-CM | POA: Diagnosis not present

## 2019-11-01 DIAGNOSIS — H3530 Unspecified macular degeneration: Secondary | ICD-10-CM | POA: Diagnosis not present

## 2019-11-01 DIAGNOSIS — R2681 Unsteadiness on feet: Secondary | ICD-10-CM | POA: Diagnosis not present

## 2019-11-01 DIAGNOSIS — R531 Weakness: Secondary | ICD-10-CM | POA: Diagnosis not present

## 2019-11-01 DIAGNOSIS — I10 Essential (primary) hypertension: Secondary | ICD-10-CM | POA: Diagnosis not present

## 2019-11-01 DIAGNOSIS — I69311 Memory deficit following cerebral infarction: Secondary | ICD-10-CM | POA: Diagnosis not present

## 2019-11-02 DIAGNOSIS — E119 Type 2 diabetes mellitus without complications: Secondary | ICD-10-CM | POA: Diagnosis not present

## 2019-11-02 DIAGNOSIS — H3530 Unspecified macular degeneration: Secondary | ICD-10-CM | POA: Diagnosis not present

## 2019-11-02 DIAGNOSIS — R531 Weakness: Secondary | ICD-10-CM | POA: Diagnosis not present

## 2019-11-02 DIAGNOSIS — I10 Essential (primary) hypertension: Secondary | ICD-10-CM | POA: Diagnosis not present

## 2019-11-02 DIAGNOSIS — I69311 Memory deficit following cerebral infarction: Secondary | ICD-10-CM | POA: Diagnosis not present

## 2019-11-02 DIAGNOSIS — R2681 Unsteadiness on feet: Secondary | ICD-10-CM | POA: Diagnosis not present

## 2019-11-02 DIAGNOSIS — N390 Urinary tract infection, site not specified: Secondary | ICD-10-CM | POA: Diagnosis not present

## 2019-11-03 DIAGNOSIS — I10 Essential (primary) hypertension: Secondary | ICD-10-CM | POA: Diagnosis not present

## 2019-11-03 DIAGNOSIS — E119 Type 2 diabetes mellitus without complications: Secondary | ICD-10-CM | POA: Diagnosis not present

## 2019-11-03 DIAGNOSIS — H3530 Unspecified macular degeneration: Secondary | ICD-10-CM | POA: Diagnosis not present

## 2019-11-03 DIAGNOSIS — R531 Weakness: Secondary | ICD-10-CM | POA: Diagnosis not present

## 2019-11-03 DIAGNOSIS — R2681 Unsteadiness on feet: Secondary | ICD-10-CM | POA: Diagnosis not present

## 2019-11-03 DIAGNOSIS — I69311 Memory deficit following cerebral infarction: Secondary | ICD-10-CM | POA: Diagnosis not present

## 2019-11-05 DIAGNOSIS — H3530 Unspecified macular degeneration: Secondary | ICD-10-CM | POA: Diagnosis not present

## 2019-11-05 DIAGNOSIS — G301 Alzheimer's disease with late onset: Secondary | ICD-10-CM | POA: Diagnosis not present

## 2019-11-09 DIAGNOSIS — G301 Alzheimer's disease with late onset: Secondary | ICD-10-CM | POA: Diagnosis not present

## 2019-11-09 DIAGNOSIS — E119 Type 2 diabetes mellitus without complications: Secondary | ICD-10-CM | POA: Diagnosis not present

## 2019-11-09 DIAGNOSIS — I10 Essential (primary) hypertension: Secondary | ICD-10-CM | POA: Diagnosis not present

## 2019-11-09 DIAGNOSIS — R531 Weakness: Secondary | ICD-10-CM | POA: Diagnosis not present

## 2019-11-09 DIAGNOSIS — H3530 Unspecified macular degeneration: Secondary | ICD-10-CM | POA: Diagnosis not present

## 2019-11-09 DIAGNOSIS — R2681 Unsteadiness on feet: Secondary | ICD-10-CM | POA: Diagnosis not present

## 2019-11-09 DIAGNOSIS — I69311 Memory deficit following cerebral infarction: Secondary | ICD-10-CM | POA: Diagnosis not present

## 2019-11-10 DIAGNOSIS — R531 Weakness: Secondary | ICD-10-CM | POA: Diagnosis not present

## 2019-11-10 DIAGNOSIS — R2681 Unsteadiness on feet: Secondary | ICD-10-CM | POA: Diagnosis not present

## 2019-11-10 DIAGNOSIS — E119 Type 2 diabetes mellitus without complications: Secondary | ICD-10-CM | POA: Diagnosis not present

## 2019-11-10 DIAGNOSIS — I10 Essential (primary) hypertension: Secondary | ICD-10-CM | POA: Diagnosis not present

## 2019-11-10 DIAGNOSIS — I69311 Memory deficit following cerebral infarction: Secondary | ICD-10-CM | POA: Diagnosis not present

## 2019-11-10 DIAGNOSIS — H3530 Unspecified macular degeneration: Secondary | ICD-10-CM | POA: Diagnosis not present

## 2019-11-12 ENCOUNTER — Emergency Department: Payer: Medicare Other

## 2019-11-12 ENCOUNTER — Emergency Department
Admission: EM | Admit: 2019-11-12 | Discharge: 2019-11-12 | Disposition: A | Discharge status: Home / Self Care | Payer: Medicare Other | Attending: Emergency Medicine | Admitting: Emergency Medicine

## 2019-11-12 ENCOUNTER — Other Ambulatory Visit: Payer: Self-pay

## 2019-11-12 DIAGNOSIS — M7989 Other specified soft tissue disorders: Secondary | ICD-10-CM | POA: Diagnosis not present

## 2019-11-12 DIAGNOSIS — Y92129 Unspecified place in nursing home as the place of occurrence of the external cause: Secondary | ICD-10-CM | POA: Diagnosis not present

## 2019-11-12 DIAGNOSIS — S3993XA Unspecified injury of pelvis, initial encounter: Secondary | ICD-10-CM | POA: Diagnosis not present

## 2019-11-12 DIAGNOSIS — S32512A Fracture of superior rim of left pubis, initial encounter for closed fracture: Secondary | ICD-10-CM | POA: Insufficient documentation

## 2019-11-12 DIAGNOSIS — Y939 Activity, unspecified: Secondary | ICD-10-CM | POA: Insufficient documentation

## 2019-11-12 DIAGNOSIS — S098XXA Other specified injuries of head, initial encounter: Secondary | ICD-10-CM | POA: Diagnosis not present

## 2019-11-12 DIAGNOSIS — S93402A Sprain of unspecified ligament of left ankle, initial encounter: Secondary | ICD-10-CM | POA: Diagnosis not present

## 2019-11-12 DIAGNOSIS — S93492A Sprain of other ligament of left ankle, initial encounter: Secondary | ICD-10-CM | POA: Insufficient documentation

## 2019-11-12 DIAGNOSIS — Z743 Need for continuous supervision: Secondary | ICD-10-CM | POA: Diagnosis not present

## 2019-11-12 DIAGNOSIS — M25462 Effusion, left knee: Secondary | ICD-10-CM | POA: Diagnosis not present

## 2019-11-12 DIAGNOSIS — S6991XA Unspecified injury of right wrist, hand and finger(s), initial encounter: Secondary | ICD-10-CM | POA: Diagnosis not present

## 2019-11-12 DIAGNOSIS — S52592A Other fractures of lower end of left radius, initial encounter for closed fracture: Secondary | ICD-10-CM | POA: Diagnosis not present

## 2019-11-12 DIAGNOSIS — S0003XA Contusion of scalp, initial encounter: Secondary | ICD-10-CM | POA: Diagnosis not present

## 2019-11-12 DIAGNOSIS — S52502A Unspecified fracture of the lower end of left radius, initial encounter for closed fracture: Secondary | ICD-10-CM

## 2019-11-12 DIAGNOSIS — S6992XA Unspecified injury of left wrist, hand and finger(s), initial encounter: Secondary | ICD-10-CM | POA: Diagnosis not present

## 2019-11-12 DIAGNOSIS — S32591A Other specified fracture of right pubis, initial encounter for closed fracture: Secondary | ICD-10-CM | POA: Diagnosis not present

## 2019-11-12 DIAGNOSIS — I10 Essential (primary) hypertension: Secondary | ICD-10-CM | POA: Diagnosis not present

## 2019-11-12 DIAGNOSIS — S32511A Fracture of superior rim of right pubis, initial encounter for closed fracture: Secondary | ICD-10-CM | POA: Diagnosis not present

## 2019-11-12 DIAGNOSIS — N390 Urinary tract infection, site not specified: Secondary | ICD-10-CM | POA: Diagnosis not present

## 2019-11-12 DIAGNOSIS — S0990XA Unspecified injury of head, initial encounter: Secondary | ICD-10-CM

## 2019-11-12 DIAGNOSIS — R279 Unspecified lack of coordination: Secondary | ICD-10-CM | POA: Diagnosis not present

## 2019-11-12 DIAGNOSIS — M25572 Pain in left ankle and joints of left foot: Secondary | ICD-10-CM | POA: Diagnosis not present

## 2019-11-12 DIAGNOSIS — S199XXA Unspecified injury of neck, initial encounter: Secondary | ICD-10-CM | POA: Diagnosis not present

## 2019-11-12 DIAGNOSIS — R52 Pain, unspecified: Secondary | ICD-10-CM

## 2019-11-12 DIAGNOSIS — Z79899 Other long term (current) drug therapy: Secondary | ICD-10-CM | POA: Diagnosis not present

## 2019-11-12 DIAGNOSIS — W19XXXA Unspecified fall, initial encounter: Secondary | ICD-10-CM | POA: Diagnosis not present

## 2019-11-12 DIAGNOSIS — E119 Type 2 diabetes mellitus without complications: Secondary | ICD-10-CM | POA: Insufficient documentation

## 2019-11-12 DIAGNOSIS — R609 Edema, unspecified: Secondary | ICD-10-CM | POA: Diagnosis not present

## 2019-11-12 DIAGNOSIS — R410 Disorientation, unspecified: Secondary | ICD-10-CM | POA: Diagnosis not present

## 2019-11-12 DIAGNOSIS — F0390 Unspecified dementia without behavioral disturbance: Secondary | ICD-10-CM

## 2019-11-12 DIAGNOSIS — Y999 Unspecified external cause status: Secondary | ICD-10-CM | POA: Insufficient documentation

## 2019-11-12 DIAGNOSIS — L8990 Pressure ulcer of unspecified site, unspecified stage: Secondary | ICD-10-CM | POA: Insufficient documentation

## 2019-11-12 LAB — COMPREHENSIVE METABOLIC PANEL
ALT: 13 U/L (ref 0–44)
AST: 16 U/L (ref 15–41)
Albumin: 3.4 g/dL — ABNORMAL LOW (ref 3.5–5.0)
Alkaline Phosphatase: 67 U/L (ref 38–126)
Anion gap: 9 (ref 5–15)
BUN: 55 mg/dL — ABNORMAL HIGH (ref 8–23)
CO2: 21 mmol/L — ABNORMAL LOW (ref 22–32)
Calcium: 9.3 mg/dL (ref 8.9–10.3)
Chloride: 110 mmol/L (ref 98–111)
Creatinine, Ser: 1.74 mg/dL — ABNORMAL HIGH (ref 0.44–1.00)
GFR calc Af Amer: 30 mL/min — ABNORMAL LOW (ref >60)
GFR calc non Af Amer: 26 mL/min — ABNORMAL LOW (ref >60)
Glucose, Bld: 138 mg/dL — ABNORMAL HIGH (ref 70–99)
Potassium: 3.8 mmol/L (ref 3.5–5.1)
Sodium: 140 mmol/L (ref 135–145)
Total Bilirubin: 0.5 mg/dL (ref 0.3–1.2)
Total Protein: 6.6 g/dL (ref 6.5–8.1)

## 2019-11-12 LAB — CBC WITH DIFFERENTIAL/PLATELET
Abs Immature Granulocytes: 0.14 10*3/uL — ABNORMAL HIGH (ref 0.00–0.07)
Basophils Absolute: 0 10*3/uL (ref 0.0–0.1)
Basophils Relative: 0 %
Eosinophils Absolute: 0 10*3/uL (ref 0.0–0.5)
Eosinophils Relative: 0 %
HCT: 33.2 % — ABNORMAL LOW (ref 36.0–46.0)
Hemoglobin: 11.3 g/dL — ABNORMAL LOW (ref 12.0–15.0)
Immature Granulocytes: 1 %
Lymphocytes Relative: 10 %
Lymphs Abs: 1.7 10*3/uL (ref 0.7–4.0)
MCH: 30.4 pg (ref 26.0–34.0)
MCHC: 34 g/dL (ref 30.0–36.0)
MCV: 89.2 fL (ref 80.0–100.0)
Monocytes Absolute: 1 10*3/uL (ref 0.1–1.0)
Monocytes Relative: 6 %
Neutro Abs: 14.9 10*3/uL — ABNORMAL HIGH (ref 1.7–7.7)
Neutrophils Relative %: 83 %
Platelets: 359 10*3/uL (ref 150–400)
RBC: 3.72 MIL/uL — ABNORMAL LOW (ref 3.87–5.11)
RDW: 13.2 % (ref 11.5–15.5)
WBC: 17.8 10*3/uL — ABNORMAL HIGH (ref 4.0–10.5)
nRBC: 0 % (ref 0.0–0.2)

## 2019-11-12 LAB — LACTIC ACID, PLASMA: Lactic Acid, Venous: 1 mmol/L (ref 0.5–1.9)

## 2019-11-12 LAB — URINALYSIS, COMPLETE (UACMP) WITH MICROSCOPIC
Bilirubin Urine: NEGATIVE
Glucose, UA: 50 mg/dL — AB
Ketones, ur: NEGATIVE mg/dL
Nitrite: NEGATIVE
Protein, ur: 100 mg/dL — AB
Specific Gravity, Urine: 1.013 (ref 1.005–1.030)
pH: 5 (ref 5.0–8.0)

## 2019-11-12 LAB — PROCALCITONIN: Procalcitonin: <0.1 ng/mL

## 2019-11-12 LAB — LIPASE, BLOOD: Lipase: 31 U/L (ref 11–51)

## 2019-11-12 MED ORDER — MORPHINE SULFATE (PF) 2 MG/ML IV SOLN
2.0000 mg | INTRAVENOUS | Status: DC | PRN
  Administered 2019-11-12: 2 mg via INTRAVENOUS
  Filled 2019-11-12: qty 1

## 2019-11-12 MED ORDER — SODIUM CHLORIDE 0.9 % IV SOLN: Freq: Once | INTRAVENOUS | Status: AC

## 2019-11-12 MED ORDER — CEPHALEXIN 500 MG PO CAPS
500.0000 mg | ORAL_CAPSULE | Freq: Three times a day (TID) | ORAL | 0 refills | Status: AC
Start: 2019-11-12 — End: 2019-11-22

## 2019-11-12 MED ORDER — HYDROCODONE-ACETAMINOPHEN 5-325 MG PO TABS: 1.0000 | ORAL_TABLET | Freq: Four times a day (QID) | ORAL | 0 refills | Status: DC | PRN

## 2019-11-12 MED ORDER — CEFTRIAXONE SODIUM 1 G IJ SOLR
1.0000 g | Freq: Once | INTRAMUSCULAR | Status: AC
  Administered 2019-11-12: 1 g via INTRAMUSCULAR
  Filled 2019-11-12: qty 10

## 2019-11-12 NOTE — TOC Initial Note (Signed)
Transition of Care [facility]) - Initial/Assessment Note    Patient Details  Name: Carolyn Hernandez MRN: [id number] Date of Birth: 1929-10-04  Transition of Care [facility]) CM/SW Contact:    Anselm Pancoast, RN Phone Number: 11/12/2019, 11:18 AM  Clinical Narrative:                 Met with patient at bedside. Patient very confused due to dementia. Notified by ED RN [facility] was requesting [facility] for follow up at facility. ED RN discussed with EDP and agreed to order [facility] for nursing and PT.   RN CM left voicemail for [facility] @ [phone] requesting specific agency requests.         Patient Goals and CMS Choice        Expected Discharge Plan and Services                                                Prior Living Arrangements/Services                       Activities of Daily Living      Permission Sought/Granted                  Emotional Assessment              Admission diagnosis:  fall ems Patient Active Problem List   Diagnosis Date Noted  . Pressure injury of skin 11/12/2019  . Dementia ([place]) 12/29/2017  . ARF (acute renal failure) ([place]) 12/23/2017  . Macular degeneration 10/06/2017  . Moderate single current episode of major depressive disorder ([place]) 10/06/2017  . Murmur, heart 09/02/2016  . Pedal edema 11/20/2015  . Dilated cardiomyopathy ([place]) 08/28/2015  . Primary osteoarthritis of both knees 01/27/2015  . Diabetes mellitus type II, controlled, with no complications ([place]) 74/93/5521  . Low TSH level 01/02/2015  . HTN (hypertension) 04/04/2014   PCP:  Verl Bangs, FNP Pharmacy:   [facility] [address], [place] [address] Phone: [phone] Fax: [place], [place] [address] [place] [address] Phone: [phone] Fax: [place], [place] [address] Phone: [phone] Fax: [phone]     Social Determinants of Health (SDOH) Interventions    Readmission Risk Interventions No flowsheet data found.

## 2019-11-12 NOTE — ED Notes (Signed)
Report given to Tammy at [facility]

## 2019-11-12 NOTE — ED Notes (Signed)
Pt transported for CT 

## 2019-11-12 NOTE — ED Provider Notes (Signed)
CT of the pelvis was read as no acute fractures.  I reviewed the films and discussed the CT with the radiologist and concur.  Since the patient is not having any further pelvic pain and can move her lower extremities without pain which she assures me of and demonstrates I will let her go back to the nursing home wearing the splint on her left wrist and give her some Keflex for her UTI.  If she needs more than just Tylenol for pain I have given her some Vicodin with caution to be careful not to get woozy and fall again.  Patient reports she feels fine except for her wrist and is ready to go home at this time.  She has not had any morphine at this point.   Nena Polio, MD 11/12/19 [phone]

## 2019-11-12 NOTE — ED Notes (Signed)
Attempted to call [facility] assisted [facility] with no answer x2

## 2019-11-12 NOTE — ED Triage Notes (Addendum)
Patient coming [facility] From [facility] for unwitnessed fall. Bruising noted to right hand, hematoma to top of head. Patient c/o left wrist pain. Patient reportedly fell when trying to get up to use the bathroom. Patient has hx of multiple mechanical falls.

## 2019-11-12 NOTE — ED Provider Notes (Signed)
[facility] Emergency Department Provider Note  ____________________________________________   First MD Initiated Contact with Patient 11/12/19 [phone]     (approximate)  I have reviewed the triage vital signs and the nursing notes.   HISTORY  Chief Complaint Fall  Level 5 caveat:  history/ROS limited by chronic dementia  HPI Carolyn Hernandez is a 86 y.o. female with history of dementia and other medical issues as listed below who presents by EMS from her nursing facility for evaluation after a fall.   She reportedly had an unwitnessed fall and was found to have bruising on her right hand, pain in her left wrist, and bruising and hematoma to the top of her head.  She does not think she lost consciousness.  She denies sore throat, chest pain, shortness of breath and abdominal pain.  She has had no nausea no vomiting.  She said the side of her head hurts as well as her left wrist.        Past Medical History:  Diagnosis Date  . Arthritis   . Depression   . Hypertension     Patient Active Problem List   Diagnosis Date Noted  . Pressure injury of skin 11/12/2019  . Dementia ([place]) 12/29/2017  . ARF (acute renal failure) ([place]) 12/23/2017  . Macular degeneration 10/06/2017  . Moderate single current episode of major depressive disorder ([place]) 10/06/2017  . Murmur, heart 09/02/2016  . Pedal edema 11/20/2015  . Dilated cardiomyopathy ([place]) 08/28/2015  . Primary osteoarthritis of both knees 01/27/2015  . Diabetes mellitus type II, controlled, with no complications ([place]) 97/98/9211  . Low TSH level 01/02/2015  . HTN (hypertension) 04/04/2014    Past Surgical History:  Procedure Laterality Date  . ABDOMINAL HYSTERECTOMY    . APPENDECTOMY    . TOTAL HIP ARTHROPLASTY      Prior to Admission medications   Medication Sig Start Date End Date Taking? Authorizing Provider  amLODipine (NORVASC) 10 MG tablet TAKE ONE TABLET EVERY DAY 09/29/19   Malfi,  Lupita Raider, FNP  citalopram (CELEXA) 20 MG tablet Take 1 tablet (20 mg total) by mouth daily. 08/10/18   Mikey College, NP  diclofenac sodium (VOLTAREN) 1 % GEL Apply 2 g topically 4 (four) times daily. 08/10/18   Mikey College, NP  donepezil (ARICEPT) 5 MG tablet TAKE 1 TABLET AT BEDTIME 05/31/19   Karamalegos, Devonne Doughty, DO  hydrochlorothiazide (HYDRODIURIL) 12.5 MG tablet TAKE 1 TABLET BY MOUTH DAILY 07/19/19   Parks Ranger, Devonne Doughty, DO  lisinopril (ZESTRIL) 40 MG tablet TAKE 1 TABLET BY MOUTH DAILY 09/28/19   Malfi, Lupita Raider, FNP  metoprolol tartrate (LOPRESSOR) 50 MG tablet Take 1 tablet (50 mg total) by mouth 2 (two) times daily. 08/10/18   Mikey College, NP    Allergies Patient has no known allergies.  Family History  Problem Relation Age of Onset  . Liver disease Son   . Alcohol abuse Son   . Diabetes Daughter   . Heart disease Son     Social History Social History   Tobacco Use  . Smoking status: Never Smoker  . Smokeless tobacco: Never Used  Substance Use Topics  . Alcohol use: No  . Drug use: No    Review of Systems Level 5 caveat:  history/ROS limited by chronic dementia   ____________________________________________   PHYSICAL EXAM:  VITAL SIGNS: ED Triage Vitals  Enc Vitals Group     BP 11/12/19 0300 (!) 185/64  Pulse Rate 11/12/19 0300 67     Resp 11/12/19 0300 18     Temp 11/12/19 0300 97.7 F (36.5 C)     Temp Source 11/12/19 0516 Rectal     SpO2 11/12/19 0300 100 %     Weight 11/12/19 0301 79.4 kg (175 lb 0.7 oz)     Height 11/12/19 0301 1.549 m (5\' 1" )     Head Circumference --      Peak Flow --      Pain Score 11/12/19 0434 Asleep     Pain Loc --      Pain Edu? --      Excl. in [place]? --     Constitutional: Alert and oriented to self. Eyes: Conjunctivae are normal.  Head: Patient has a large frontoparietal right-sided hematoma consistent with contusion of the side of her head.  It is tender to palpation.  No  laceration or abrasions visible. Nose: No congestion/rhinnorhea.  No evidence of nasal or facial trauma. Mouth/Throat: Patient is wearing a mask. Neck: No stridor.  No meningeal signs.   Cardiovascular: Normal rate, regular rhythm. Good peripheral circulation. Grossly normal heart sounds. Respiratory: Normal respiratory effort.  No retractions. Gastrointestinal: Soft and nontender. No distention.  Musculoskeletal: Patient has bruising to her right hand with some tenderness to palpation but no pain or tenderness with flexion extension of the wrist nor of the elbow or shoulder.  The left wrist appears somewhat deformed although it is not swollen nor ecchymotic.  There is pain with manipulation of the left wrist.  She has some tenderness of the distal forearm as well but no pain or tenderness of the left elbow nor left humerus nor shoulder.  She reports pain with passive range of motion of the left leg but is difficult to isolate exactly where it hurts.  She describes pain throughout the left leg but particularly around the left ankle.  The right leg is nonpainful and nontender to passive range of motion and palpation. Neurologic:  Normal speech and language. No gross focal neurologic deficits are appreciated.  Skin:  Skin is warm, dry and intact. Psychiatric: Mood and affect are normal. Speech and behavior are normal.  ____________________________________________   LABS (all labs ordered are listed, but only abnormal results are displayed)  Labs Reviewed  URINALYSIS, COMPLETE (UACMP) WITH MICROSCOPIC - Abnormal; Notable for the following components:      Result Value   Color, Urine YELLOW (*)    APPearance CLOUDY (*)    Glucose, UA 50 (*)    Hgb urine dipstick SMALL (*)    Protein, ur 100 (*)    Leukocytes,Ua LARGE (*)    Bacteria, UA MANY (*)    Non Squamous Epithelial PRESENT (*)    All other components within normal limits  URINE CULTURE  SARS CORONAVIRUS 2 BY RT PCR (HOSPITAL ORDER,  [place] [facility])  CBC WITH DIFFERENTIAL/PLATELET  COMPREHENSIVE METABOLIC PANEL  LACTIC ACID, PLASMA  LACTIC ACID, PLASMA  LIPASE, BLOOD  PROCALCITONIN   ____________________________________________  EKG  None - EKG not ordered by ED physician ____________________________________________  RADIOLOGY Ursula Alert, personally viewed and evaluated these images (plain radiographs) as part of my medical decision making, as well as reviewing the written report by the radiologist.  ED MD interpretation: Left wrist fracture, normal head CT except for the soft tissue hematoma, no acute abnormalities of the cervical spine, right hand show no sign of acute fracture nor dislocation but extensive chronic changes.  Hip/pelvis x-rays demonstrate right inferior and superior pubic rami fractures and the left superior pubic ramus fracture, possibly inferior as well.  Radiology pointed out that this could be further elucidated with cross-sectional imaging.  Left knee x-ray was within normal limits with no acute abnormalities and just chronic changes, but the left ankle shows soft tissue swelling.  Official radiology report(s): DG Wrist Complete Left  Result Date: 11/12/2019 CLINICAL DATA:  Fall EXAM: LEFT WRIST - COMPLETE 3+ VIEW COMPARISON:  None. FINDINGS: The osseous structures appear diffusely demineralized which may limit detection of small or nondisplaced fractures. Suspected impacted fracture with volar angulation at the distal radial metaphysis. Corticated fragments adjacent the ulnar styloid are likely remote posttraumatic and/or degenerative in nature. There is chondrocalcinosis across the triradiate cartilage. Mild positive ulnar variance. Extensive arthropathy noted at the first carpometacarpal joint and triscaphe joints. Mild degenerative changes across the carpal rows and radio carpal articulations as well. IMPRESSION: 1. Suspected impacted fracture with volar angulation at  the distal radial metaphysis. 2. Corticated fragments adjacent to the ulnar styloid are likely remote posttraumatic and/or degenerative in nature. 3. Extensive arthropathy throughout the wrist, as above Electronically Signed   By: Lovena Le M.D.   On: 11/12/2019 03:49   DG Knee 2 Views Left  Result Date: 11/12/2019 CLINICAL DATA:  Fall. EXAM: LEFT KNEE - 1-2 VIEW COMPARISON:  None. FINDINGS: Mild osteopenia is noted. Tricompartmental degenerative changes are present. A small effusion is present. No acute or healing fractures are evident. Atherosclerotic changes are noted. IMPRESSION: 1. Small joint effusion. 2. No acute or healing fractures. 3. Tricompartmental degenerative changes. Electronically Signed   By: San Morelle M.D.   On: 11/12/2019 07:07   DG Ankle Complete Left  Result Date: 11/12/2019 CLINICAL DATA:  Fall.  Pain. EXAM: LEFT ANKLE COMPLETE - 3+ VIEW COMPARISON:  None. FINDINGS: Soft tissue swelling is present about the ankle. Mild osteopenia is present. The ankle is located. No acute fractures are present. Degenerative changes are noted in the hindfoot. Calcaneal spurs are present. Vascular calcifications are noted. IMPRESSION: 1. Soft tissue swelling about the ankle without underlying fracture. 2. Calcaneal spurs. 3. Atherosclerosis. Electronically Signed   By: San Morelle M.D.   On: 11/12/2019 07:12   CT Head Wo Contrast  Result Date: 11/12/2019 CLINICAL DATA:  Fall consult for could consult EXAM: CT HEAD WITHOUT CONTRAST CT CERVICAL SPINE WITHOUT CONTRAST TECHNIQUE: Multidetector CT imaging of the head and cervical spine was performed following the standard protocol without intravenous contrast. Multiplanar CT image reconstructions of the cervical spine were also generated. COMPARISON:  CT head 04/22/2002 (report only) FINDINGS: CT HEAD FINDINGS Brain: No evidence of acute infarction, hemorrhage, hydrocephalus, extra-axial collection or mass lesion/mass effect.  Symmetric prominence of the ventricles, cisterns and sulci compatible with parenchymal volume loss. Patchy areas of white matter hypoattenuation are most compatible with chronic microvascular angiopathy. Vascular: Atherosclerotic calcification of the carotid siphons and intradural vertebral arteries. No hyperdense vessel. Skull: There is a large right frontoparietal scalp hematoma extending towards the vertex measuring up to 11 mm in maximal thickness. Additional swelling extends across the frontal scalp to the supraorbital soft tissues on the right. No subjacent calvarial fracture is seen. Hyperostosis frontalis interna is noted, a benign incidental finding. Sinuses/Orbits: Paranasal sinuses and mastoid air cells are predominantly clear. Prior left lens extraction. Other: Patient is edentulous. CT CERVICAL SPINE FINDINGS Alignment: Stabilization collar is absent at the time of examination. There is mild straightening of the midcervical lordosis. There  is slight anterolisthesis C3 on 4 and retrolisthesis C4 on 5 likely on a degenerative basis. No evidence of traumatic listhesis. No abnormally widened, perched or jumped facets. Normal alignment of the craniocervical and atlantoaxial articulations accounting for degree of leftward head rotation. Skull base and vertebrae: No acute fracture. No primary bone lesion or focal pathologic process. There is bony fusion of the vertebrae from C5-C7. Extensive posterior enthesopathic calcification noted superficial to the spinous processes along the nuchal ligament. There is extensive calcific pannus formation about the dens on a background of more moderate to severe atlantodental arthrosis including degenerative change with articulation of the basion. Soft tissues and spinal canal: No pre or paravertebral fluid or swelling. No visible canal hematoma. Disc levels: Multilevel intervertebral disc height loss with spondylitic endplate changes. Posterior disc osteophyte complexes  are most pronounced at the C4-5 and C5-6 levels resulting in at least moderate canal stenosis. Some posterior ligamentum flavum hypertrophic changes result in mild stenosis at C2-3, C3-4 and C7-T1 as well. Diffuse uncinate spurring and facet hypertrophic change results in neural foraminal narrowing as well with more moderate to severe narrowing bilaterally at C3-4 and C4-5. Upper chest: No acute abnormality in the upper chest or imaged lung apices. Other: Cervical carotid atherosclerosis. Heterogeneous, multinodular thyroid including several partially calcified nodules. IMPRESSION: 1. Large right frontoparietal scalp hematoma extending towards the vertex measuring up to 11 mm in maximal thickness. Additional swelling extends across the frontal scalp to the supraorbital soft tissues on the right. No subjacent calvarial fracture is seen. No acute intracranial abnormality. 2. Chronic microvascular angiopathy and parenchymal volume loss. 3. No evidence of acute fracture or traumatic listhesis of the cervical spine. 4. Bony fusion of the vertebrae from C5-C7. 5. Multilevel intervertebral disc height loss with spondylitic endplate changes and facet hypertrophic change detailed above. Maximal change includes moderate canal stenoses at C4-5, C5-6 and moderate to severe foraminal narrowing C3-4 and C4-5 bilaterally. 6. Heterogeneous, multinodular thyroid including several partially calcified nodules. Consider further evaluation with thyroid ultrasound following assessment of patient's underlying comorbidities and desire to pursue further workup. This follows consensus guidelines: Managing Incidental Thyroid Nodules Detected on Imaging: White Paper of the ACR Incidental Thyroid Findings Committee. J Am Coll Radiol 2015; 12:143-150. and [facility] 3-tiered system for managing ITNs: J Am Coll Radiol. 2015; Feb;12(2): 143-50 7. Cervical intracranial atherosclerosis. Electronically Signed   By: Lovena Le M.D.   On: 11/12/2019 03:59     CT Cervical Spine Wo Contrast  Result Date: 11/12/2019 CLINICAL DATA:  Fall consult for could consult EXAM: CT HEAD WITHOUT CONTRAST CT CERVICAL SPINE WITHOUT CONTRAST TECHNIQUE: Multidetector CT imaging of the head and cervical spine was performed following the standard protocol without intravenous contrast. Multiplanar CT image reconstructions of the cervical spine were also generated. COMPARISON:  CT head 04/22/2002 (report only) FINDINGS: CT HEAD FINDINGS Brain: No evidence of acute infarction, hemorrhage, hydrocephalus, extra-axial collection or mass lesion/mass effect. Symmetric prominence of the ventricles, cisterns and sulci compatible with parenchymal volume loss. Patchy areas of white matter hypoattenuation are most compatible with chronic microvascular angiopathy. Vascular: Atherosclerotic calcification of the carotid siphons and intradural vertebral arteries. No hyperdense vessel. Skull: There is a large right frontoparietal scalp hematoma extending towards the vertex measuring up to 11 mm in maximal thickness. Additional swelling extends across the frontal scalp to the supraorbital soft tissues on the right. No subjacent calvarial fracture is seen. Hyperostosis frontalis interna is noted, a benign incidental finding. Sinuses/Orbits: Paranasal sinuses and mastoid air cells  are predominantly clear. Prior left lens extraction. Other: Patient is edentulous. CT CERVICAL SPINE FINDINGS Alignment: Stabilization collar is absent at the time of examination. There is mild straightening of the midcervical lordosis. There is slight anterolisthesis C3 on 4 and retrolisthesis C4 on 5 likely on a degenerative basis. No evidence of traumatic listhesis. No abnormally widened, perched or jumped facets. Normal alignment of the craniocervical and atlantoaxial articulations accounting for degree of leftward head rotation. Skull base and vertebrae: No acute fracture. No primary bone lesion or focal pathologic process.  There is bony fusion of the vertebrae from C5-C7. Extensive posterior enthesopathic calcification noted superficial to the spinous processes along the nuchal ligament. There is extensive calcific pannus formation about the dens on a background of more moderate to severe atlantodental arthrosis including degenerative change with articulation of the basion. Soft tissues and spinal canal: No pre or paravertebral fluid or swelling. No visible canal hematoma. Disc levels: Multilevel intervertebral disc height loss with spondylitic endplate changes. Posterior disc osteophyte complexes are most pronounced at the C4-5 and C5-6 levels resulting in at least moderate canal stenosis. Some posterior ligamentum flavum hypertrophic changes result in mild stenosis at C2-3, C3-4 and C7-T1 as well. Diffuse uncinate spurring and facet hypertrophic change results in neural foraminal narrowing as well with more moderate to severe narrowing bilaterally at C3-4 and C4-5. Upper chest: No acute abnormality in the upper chest or imaged lung apices. Other: Cervical carotid atherosclerosis. Heterogeneous, multinodular thyroid including several partially calcified nodules. IMPRESSION: 1. Large right frontoparietal scalp hematoma extending towards the vertex measuring up to 11 mm in maximal thickness. Additional swelling extends across the frontal scalp to the supraorbital soft tissues on the right. No subjacent calvarial fracture is seen. No acute intracranial abnormality. 2. Chronic microvascular angiopathy and parenchymal volume loss. 3. No evidence of acute fracture or traumatic listhesis of the cervical spine. 4. Bony fusion of the vertebrae from C5-C7. 5. Multilevel intervertebral disc height loss with spondylitic endplate changes and facet hypertrophic change detailed above. Maximal change includes moderate canal stenoses at C4-5, C5-6 and moderate to severe foraminal narrowing C3-4 and C4-5 bilaterally. 6. Heterogeneous, multinodular  thyroid including several partially calcified nodules. Consider further evaluation with thyroid ultrasound following assessment of patient's underlying comorbidities and desire to pursue further workup. This follows consensus guidelines: Managing Incidental Thyroid Nodules Detected on Imaging: White Paper of the ACR Incidental Thyroid Findings Committee. J Am Coll Radiol 2015; 12:143-150. and [facility] 3-tiered system for managing ITNs: J Am Coll Radiol. 2015; Feb;12(2): 143-50 7. Cervical intracranial atherosclerosis. Electronically Signed   By: Lovena Le M.D.   On: 11/12/2019 03:59   DG Hand Complete Right  Result Date: 11/12/2019 CLINICAL DATA:  Fall EXAM: RIGHT HAND - COMPLETE 3+ VIEW COMPARISON:  None. FINDINGS: Bones diffusely demineralized additionally there is extensive periarticular erosive and destructive changes predominantly centered upon the metacarpophalangeal and proximal interphalangeal joints as well as at the base of the first metacarpal and triscaphe joint. These findings include features of marginal erosion, most pronounced at the head of the third metacarpal and base of the first metacarpal. Chondrocalcinosis in the triradiate cartilage may reflect some concomitant calcium pyrophosphate deposition disease as well. No definite acute fracture or traumatic malalignment. IMPRESSION: No definite acute fracture or traumatic injury is identified. Extensive periarticular erosive and destructive changes predominantly centered upon the metacarpophalangeal and proximal interphalangeal joints as well as at the base of the first metacarpal and base of the first metacarpal. Subluxation of the metacarpophalangeal joints  is favored to be secondary to ligamentous laxity rather than acute traumatic change. Constellation of findings suggest an inflammatory arthropathy, favor rheumatoid but with additional features of CPPD arthropathy as well. Electronically Signed   By: Lovena Le M.D.   On: 11/12/2019 03:46     DG HIP UNILAT WITH PELVIS 2-3 VIEWS LEFT  Result Date: 11/12/2019 CLINICAL DATA:  Fall EXAM: DG HIP (WITH OR WITHOUT PELVIS) 2-3V LEFT COMPARISON:  Radiograph 12/16/2009 FINDINGS: Suboptimal patient positioning due to difficulty with following commands. The osseous structures appear diffusely demineralized which may limit detection of small or nondisplaced fractures. There is cortical step-off likely reflecting fracture of the superior pubic ramus which extends into the pubic body. Suspect a fracture of the right superior ramus and pubic body as well. Questionable lucency along the inferior pubic ramus on the left as well. Remaining portions of the pelvis are grossly intact though the sacrum is difficult to fully evaluate given overlying bowel gas. No abnormal diastatic widening of the symphysis pubis or SI joints. The proximal femur appears normally located and grossly intact with postsurgical changes from prior intramedullary nail and transcervical fixation. The bizarre appearance of the lesser trochanter likely reflect some exuberant callus formation at the site of prior avulsion. Degenerative changes noted about the lower lumbar spine hips and pelvis. Vascular calcium in the soft tissues. Mild lateral hip swelling. IMPRESSION: 1. Diffuse osseous demineralization may limit detection of subtle or nondisplaced fractures. 2. Fracture of the left superior pubic ramus extending into the pubic body. 3. Likely fractures of the right superior ramus and pubic body as well. 4. Lucency through the left inferior ramus is less convincing. Could consider further evaluation with cross-sectional imaging. Which would better evaluate the aforementioned fractures as well as the sacrum which is partially obscured on this exam. 5. Prior ORIF of the left femur without visible acute complication. Electronically Signed   By: Lovena Le M.D.   On: 11/12/2019 07:00     ____________________________________________   PROCEDURES   Procedure(s) performed (including Critical Care):  .Ortho Injury Treatment  Date/Time: 11/12/2019 7:29 AM Performed by: Hinda Kehr, MD Authorized by: Hinda Kehr, MD   Consent:    Consent obtained:  Verbal   Consent given by:  PatientInjury location: wrist Location details: left wrist Injury type: fracture Fracture type: distal radius Pre-procedure neurovascular assessment: neurovascularly intact Pre-procedure distal perfusion: normal Pre-procedure neurological function: normal Pre-procedure range of motion: reduced Manipulation performed: no Immobilization: splint Splint type: sugar tong Supplies used: Ortho-Glass Post-procedure neurovascular assessment: post-procedure neurovascularly intact Post-procedure distal perfusion: normal Post-procedure neurological function: normal Post-procedure range of motion: unchanged Patient tolerance: patient tolerated the procedure well with no immediate complications      ____________________________________________   INITIAL IMPRESSION / MDM / [place] / ED COURSE  As part of my medical decision making, I reviewed the following data within the [place] notes reviewed and incorporated, Old chart reviewed, Patient signed out to Dr. Cinda Quest and reviewed Notes from prior ED visits   Differential diagnosis includes, but is not limited to, fall with resulting head injury, intracranial bleeding, cervical spine injury, fracture or dislocation of her extremities.  She has no chest or abdominal involvement.  Given the obvious contusion to her head, we are obtaining a CT scan of her head and cervical spine.  Plain films of her left wrist demonstrate a distal radial fracture and I have ordered a sugar tong splint.  Her right hand x-ray showed no  sign of acute abnormality.  It was after the initial assessment that she reported the pain in  her left leg so I have ordered left hip and pelvis, left knee, and left ankle radiographs.  A representative from the nursing facility called and said that she has been acting more confused than usual and that her urine has a foul smell.  We obtained a urinalysis and it was grossly positive.  I ordered a urine culture which is pending and I am treating empirically with ceftriaxone 1 g intramuscular and will write her prescription for Keflex.  Given that she has no pain in her abdomen nor flanks and has been having no other signs or symptoms of kidney stone, I do not feel that she needs a CT scan of the abdomen and pelvis to rule out an infected stone at this time.  Her vital signs are stable and reassuring.  I do not feel that there is a role for blood work at this time because regardless she will be treated with antibiotics for her urinary tract infection.  We checked a rectal temperature to make sure she was not febrile and the rectal temperature was normal.       Clinical Course as of Nov 11 729  Fri Nov 12, 2019  0723 The patient has what appears to be a left ankle sprain, no knee injury, but at least the left and right superior pubic ramus fracture and right inferior pubic ramus, if not the left inferior as well.  This explains her pain with any amount of movement.  I have ordered morphine 2 mg IV as needed for moderate to severe pain q. 1 hour x 3 doses.  I have ordered lab work including procalcitonin and lactic acid given her urinary tract infection.  The patient may eat or drink and have ordered normal saline 100 mL/h to try to avoid her getting dehydrated while in the emergency department.  Radiology mentioned that her injury could be further assessed using cross-sectional imaging so I ordered a CT pelvis without contrast for further evaluation.I discussed the case in person with Dr. Cinda Quest who is taking over care in the emergency department.  He will follow up on the imaging and lab results  anticipate admission for orthopedics consult, pain control, and PT/OT evaluation with placement recommendations.   [CF]    Clinical Course User Index [CF] Hinda Kehr, MD     ____________________________________________  FINAL CLINICAL IMPRESSION(S) / ED DIAGNOSES  Final diagnoses:  Fall, initial encounter  Urinary tract infection without hematuria, site unspecified  Pain  Minor head injury, initial encounter  Hematoma of scalp, initial encounter  Closed fracture of distal end of left radius, unspecified fracture morphology, initial encounter  Sprain of left ankle, unspecified ligament, initial encounter  Closed fracture of superior ramus of left pubis, initial encounter (HCC)  Closed fracture of superior ramus of right pubis, initial encounter ([place])  Closed fracture of right inferior pubic ramus, initial encounter ([place])  Dementia without behavioral disturbance, unspecified dementia type ([place])     MEDICATIONS GIVEN DURING THIS VISIT:  Medications  0.9 %  sodium chloride infusion (has no administration in time range)  morphine 2 MG/ML injection 2 mg (has no administration in time range)  cefTRIAXone (ROCEPHIN) injection 1 g (1 g Intramuscular Given 11/12/19 0600)     ED Discharge Orders    None      *Please note:  Carolyn Hernandez was evaluated in Emergency Department on 11/12/2019 for the  symptoms described in the history of present illness. She was evaluated in the context of the global COVID-19 pandemic, which necessitated consideration that the patient might be at risk for infection with the SARS-CoV-2 virus that causes COVID-19. Institutional protocols and algorithms that pertain to the evaluation of patients at risk for COVID-19 are in a state of rapid change based on information released by regulatory bodies including the CDC and federal and state organizations. These policies and algorithms were followed during the patient's care in the ED.  Some ED evaluations and  interventions may be delayed as a result of limited staffing during the pandemic.*  Note:  This document was prepared using Dragon voice recognition software and may include unintentional dictation errors.   Hinda Kehr, MD 11/12/19 [phone]

## 2019-11-12 NOTE — ED Notes (Signed)
Patient transported to radiology

## 2019-11-12 NOTE — ED Notes (Signed)
ED Provider at bedside. 

## 2019-11-12 NOTE — Discharge Instructions (Addendum)
You have a fracture of your left wrist.  Please wear the splint.  Please call Dr. Earnestine Leys the orthopedic surgeon to arrange a follow-up appointment within a week or so.  You also have a urinary tract infection.  Please use the Keflex antibiotic 1 pill 3 times a day.  Use Tylenol for pain.  If that is not enough use the Vicodin 1 pill 4 times a day.  Be careful the Vicodin can make you very woozy do not fall.  Please return for any new problems or pain.

## 2019-11-12 NOTE — ED Notes (Addendum)
Patient taken to room 36. In and out cath performed by this RN, witnessed by Laury Axon NT. Urine obtained and sent to lab. Patient brought back to hallway.  Stage 2 decubitus ulcer noted on assessment of buttocks during in and out cath. Hydrocolloid dressing applied. MD informed.

## 2019-11-12 NOTE — ED Notes (Signed)
Pt unable to sigh for discharge d/t dementia

## 2019-11-12 NOTE — ED Notes (Signed)
Two silver-colored metal rings taken off right hand, placed in labeled specimen cup and bag, and given back to patient by Laury Axon NT.

## 2019-11-12 NOTE — ED Notes (Signed)
Caregiver from facility called and informed this RN that patient's urine has a foul odor and is requesting urinalysis be performed while in the ED. MD informed.

## 2019-11-13 LAB — URINE CULTURE
Culture: NO GROWTH
Special Requests: NORMAL

## 2019-11-16 DIAGNOSIS — E119 Type 2 diabetes mellitus without complications: Secondary | ICD-10-CM | POA: Diagnosis not present

## 2019-11-16 DIAGNOSIS — H3530 Unspecified macular degeneration: Secondary | ICD-10-CM | POA: Diagnosis not present

## 2019-11-16 DIAGNOSIS — R531 Weakness: Secondary | ICD-10-CM | POA: Diagnosis not present

## 2019-11-16 DIAGNOSIS — R2681 Unsteadiness on feet: Secondary | ICD-10-CM | POA: Diagnosis not present

## 2019-11-16 DIAGNOSIS — I69311 Memory deficit following cerebral infarction: Secondary | ICD-10-CM | POA: Diagnosis not present

## 2019-11-16 DIAGNOSIS — I10 Essential (primary) hypertension: Secondary | ICD-10-CM | POA: Diagnosis not present

## 2019-11-17 ENCOUNTER — Other Ambulatory Visit: Payer: Self-pay

## 2019-11-17 ENCOUNTER — Emergency Department
Admission: EM | Admit: 2019-11-17 | Discharge: 2019-11-22 | Disposition: A | Discharge status: Skilled Nursing Facility | Payer: Medicare Other | Attending: Emergency Medicine | Admitting: Emergency Medicine

## 2019-11-17 DIAGNOSIS — I69311 Memory deficit following cerebral infarction: Secondary | ICD-10-CM | POA: Diagnosis not present

## 2019-11-17 DIAGNOSIS — Z751 Person awaiting admission to adequate facility elsewhere: Secondary | ICD-10-CM | POA: Diagnosis not present

## 2019-11-17 DIAGNOSIS — Z20822 Contact with and (suspected) exposure to covid-19: Secondary | ICD-10-CM | POA: Diagnosis not present

## 2019-11-17 DIAGNOSIS — W19XXXD Unspecified fall, subsequent encounter: Secondary | ICD-10-CM | POA: Insufficient documentation

## 2019-11-17 DIAGNOSIS — R52 Pain, unspecified: Secondary | ICD-10-CM

## 2019-11-17 DIAGNOSIS — R102 Pelvic and perineal pain: Secondary | ICD-10-CM | POA: Diagnosis not present

## 2019-11-17 DIAGNOSIS — R404 Transient alteration of awareness: Secondary | ICD-10-CM | POA: Diagnosis not present

## 2019-11-17 DIAGNOSIS — Z743 Need for continuous supervision: Secondary | ICD-10-CM | POA: Diagnosis not present

## 2019-11-17 DIAGNOSIS — R6889 Other general symptoms and signs: Secondary | ICD-10-CM | POA: Diagnosis not present

## 2019-11-17 DIAGNOSIS — H3530 Unspecified macular degeneration: Secondary | ICD-10-CM | POA: Diagnosis not present

## 2019-11-17 DIAGNOSIS — E119 Type 2 diabetes mellitus without complications: Secondary | ICD-10-CM | POA: Insufficient documentation

## 2019-11-17 DIAGNOSIS — M170 Bilateral primary osteoarthritis of knee: Secondary | ICD-10-CM | POA: Insufficient documentation

## 2019-11-17 DIAGNOSIS — I420 Dilated cardiomyopathy: Secondary | ICD-10-CM | POA: Diagnosis not present

## 2019-11-17 DIAGNOSIS — M25551 Pain in right hip: Secondary | ICD-10-CM | POA: Insufficient documentation

## 2019-11-17 DIAGNOSIS — R0902 Hypoxemia: Secondary | ICD-10-CM | POA: Diagnosis not present

## 2019-11-17 DIAGNOSIS — Z79899 Other long term (current) drug therapy: Secondary | ICD-10-CM | POA: Insufficient documentation

## 2019-11-17 DIAGNOSIS — F0390 Unspecified dementia without behavioral disturbance: Secondary | ICD-10-CM | POA: Insufficient documentation

## 2019-11-17 DIAGNOSIS — R2681 Unsteadiness on feet: Secondary | ICD-10-CM | POA: Diagnosis not present

## 2019-11-17 DIAGNOSIS — I10 Essential (primary) hypertension: Secondary | ICD-10-CM | POA: Insufficient documentation

## 2019-11-17 DIAGNOSIS — Z03818 Encounter for observation for suspected exposure to other biological agents ruled out: Secondary | ICD-10-CM | POA: Diagnosis not present

## 2019-11-17 DIAGNOSIS — R531 Weakness: Secondary | ICD-10-CM | POA: Diagnosis not present

## 2019-11-17 HISTORY — DX: Unspecified fall, initial encounter: W19.XXXA

## 2019-11-17 LAB — CBC WITH DIFFERENTIAL/PLATELET
Abs Immature Granulocytes: 0.06 10*3/uL (ref 0.00–0.07)
Basophils Absolute: 0 10*3/uL (ref 0.0–0.1)
Basophils Relative: 0 %
Eosinophils Absolute: 0.6 10*3/uL — ABNORMAL HIGH (ref 0.0–0.5)
Eosinophils Relative: 4 %
HCT: 35.5 % — ABNORMAL LOW (ref 36.0–46.0)
Hemoglobin: 12 g/dL (ref 12.0–15.0)
Immature Granulocytes: 0 %
Lymphocytes Relative: 20 %
Lymphs Abs: 2.6 10*3/uL (ref 0.7–4.0)
MCH: 30.8 pg (ref 26.0–34.0)
MCHC: 33.8 g/dL (ref 30.0–36.0)
MCV: 91.3 fL (ref 80.0–100.0)
Monocytes Absolute: 1.2 10*3/uL — ABNORMAL HIGH (ref 0.1–1.0)
Monocytes Relative: 9 %
Neutro Abs: 8.8 10*3/uL — ABNORMAL HIGH (ref 1.7–7.7)
Neutrophils Relative %: 67 %
Platelets: 344 10*3/uL (ref 150–400)
RBC: 3.89 MIL/uL (ref 3.87–5.11)
RDW: 13.5 % (ref 11.5–15.5)
WBC: 13.4 10*3/uL — ABNORMAL HIGH (ref 4.0–10.5)
nRBC: 0 % (ref 0.0–0.2)

## 2019-11-17 LAB — PROTIME-INR
INR: 1 (ref 0.8–1.2)
Prothrombin Time: 12.8 seconds (ref 11.4–15.2)

## 2019-11-17 LAB — COMPREHENSIVE METABOLIC PANEL
ALT: 13 U/L (ref 0–44)
AST: 15 U/L (ref 15–41)
Albumin: 3.5 g/dL (ref 3.5–5.0)
Alkaline Phosphatase: 74 U/L (ref 38–126)
Anion gap: 8 (ref 5–15)
BUN: 45 mg/dL — ABNORMAL HIGH (ref 8–23)
CO2: 21 mmol/L — ABNORMAL LOW (ref 22–32)
Calcium: 9.4 mg/dL (ref 8.9–10.3)
Chloride: 110 mmol/L (ref 98–111)
Creatinine, Ser: 1.63 mg/dL — ABNORMAL HIGH (ref 0.44–1.00)
GFR calc Af Amer: 32 mL/min — ABNORMAL LOW (ref >60)
GFR calc non Af Amer: 28 mL/min — ABNORMAL LOW (ref >60)
Glucose, Bld: 174 mg/dL — ABNORMAL HIGH (ref 70–99)
Potassium: 3.9 mmol/L (ref 3.5–5.1)
Sodium: 139 mmol/L (ref 135–145)
Total Bilirubin: 0.5 mg/dL (ref 0.3–1.2)
Total Protein: 7 g/dL (ref 6.5–8.1)

## 2019-11-17 LAB — SARS CORONAVIRUS 2 BY RT PCR (HOSPITAL ORDER, PERFORMED IN ~~LOC~~ HOSPITAL LAB): SARS Coronavirus 2: NEGATIVE

## 2019-11-17 MED ORDER — HYDROCHLOROTHIAZIDE 25 MG PO TABS
12.5000 mg | ORAL_TABLET | Freq: Every day | ORAL | Status: DC
  Administered 2019-11-18 – 2019-11-22 (×6): 12.5 mg via ORAL
  Filled 2019-11-17 (×8): qty 1

## 2019-11-17 MED ORDER — DONEPEZIL HCL 5 MG PO TABS
5.0000 mg | ORAL_TABLET | Freq: Every day | ORAL | Status: DC
  Administered 2019-11-18 – 2019-11-21 (×4): 5 mg via ORAL
  Filled 2019-11-17 (×5): qty 1

## 2019-11-17 MED ORDER — CITALOPRAM HYDROBROMIDE 20 MG PO TABS
20.0000 mg | ORAL_TABLET | Freq: Every day | ORAL | Status: DC
  Administered 2019-11-18 – 2019-11-22 (×6): 20 mg via ORAL
  Filled 2019-11-17 (×7): qty 1

## 2019-11-17 MED ORDER — AMLODIPINE BESYLATE 5 MG PO TABS
10.0000 mg | ORAL_TABLET | Freq: Every day | ORAL | Status: DC
  Administered 2019-11-18 – 2019-11-22 (×6): 10 mg via ORAL
  Filled 2019-11-17 (×6): qty 2

## 2019-11-17 MED ORDER — CEPHALEXIN 500 MG PO CAPS
500.0000 mg | ORAL_CAPSULE | Freq: Three times a day (TID) | ORAL | Status: DC
  Administered 2019-11-18 – 2019-11-22 (×14): 500 mg via ORAL
  Filled 2019-11-17 (×15): qty 1

## 2019-11-17 MED ORDER — LISINOPRIL 10 MG PO TABS
40.0000 mg | ORAL_TABLET | Freq: Every day | ORAL | Status: DC
  Administered 2019-11-18 – 2019-11-22 (×6): 40 mg via ORAL
  Filled 2019-11-17 (×7): qty 4

## 2019-11-17 MED ORDER — METOPROLOL TARTRATE 50 MG PO TABS
50.0000 mg | ORAL_TABLET | Freq: Two times a day (BID) | ORAL | Status: DC
  Administered 2019-11-18 – 2019-11-22 (×8): 50 mg via ORAL
  Filled 2019-11-17 (×9): qty 1

## 2019-11-17 MED ORDER — HYDROCODONE-ACETAMINOPHEN 5-325 MG PO TABS
1.0000 | ORAL_TABLET | Freq: Four times a day (QID) | ORAL | Status: DC | PRN
  Administered 2019-11-18 – 2019-11-21 (×2): 1 via ORAL
  Filled 2019-11-17 (×2): qty 1

## 2019-11-17 NOTE — TOC Initial Note (Signed)
Transition of Care [facility]) - Initial/Assessment Note   Patient Details  Name: Carolyn Hernandez MRN: [id number] Date of Birth: 07/27/1929  Transition of Care [facility]) CM/SW Contact:    Sherie Don, LCSW Phone Number: 11/17/2019, 9:44 PM  Clinical Narrative: Patient is an 86 year old female who presented at the ED following a fall several days ago at her ALF, [facility]. TOC received consult for SNF placement. CSW called the patient's son/POA, Carolyn Hernandez ([phone]), to follow up with SNF choices pending PT evaluation. Per son, the family's first choice is [employer] and their second choice is [facility]. Son reported [name] will hold the patient's bed up to 30 days if she meets criteria for SNF for rehab. Son asked when the patient will transfer to SNF. CSW explained that after the PT evaluation is complete, an insurance authorization will need to be started through [facility]. Patient's PASRR number is [id number].  Expected Discharge Plan: Skilled Nursing Facility Barriers to Discharge: Continued Medical Work up, ED SNF auth  Patient Goals and CMS Choice Patient states their goals for this hospitalization and ongoing recovery are:: Discharge to SNF for rehab CMS Medicare.gov Compare Post Acute Care list provided to:: Patient Represenative (must comment)(Carolyn Hernandez (son) who is POA) Choice offered to / list presented to : Carolyn Hernandez POA / Guardian(Carolyn Production assistant, radio (son))  Expected Discharge Plan and Services Expected Discharge Plan: [place] Choice: [place] arrangements for the past 2 months: Assisted Living Facility([name] (memory care unit))  Prior Living Arrangements/Services Living arrangements for the past 2 months: Assisted Living Facility([name] (memory care unit)) Lives with:: Facility Resident Patient language and need for interpreter reviewed:: Yes Do you feel safe going back to the place where you  live?: Yes      Need for Family Participation in Patient Care: Yes (Comment)(Patient has dementia) Care giver support system in place?: Yes (comment)(Carolyn Tutor (son); [name] (daughter)) Criminal Activity/Legal Involvement Pertinent to Current Situation/Hospitalization: No - Comment as needed  Permission Sought/Granted Permission sought to share information with : Facility Sport and exercise psychologist, Family Supports Permission granted to share information with : Yes, Verbal Permission Granted Share Information with NAME: Carolyn Hernandez Permission granted to share info w Relationship: Son/POA Permission granted to share info w Contact Information: [phone]  Emotional Assessment Appearance:: Appears stated age Attitude/Demeanor/Rapport: Unable to Assess Affect (typically observed): Unable to Assess Orientation: : Fluctuating Orientation (Suspected and/or reported Sundowners) Alcohol / Substance Use: Not Applicable  Admission diagnosis:  pain ems Patient Active Problem List   Diagnosis Date Noted   Pressure injury of skin 11/12/2019   Dementia ([place]) 12/29/2017   ARF (acute renal failure) ([place]) 12/23/2017   Macular degeneration 10/06/2017   Moderate single current episode of major depressive disorder ([place]) 10/06/2017   Murmur, heart 09/02/2016   Pedal edema 11/20/2015   Dilated cardiomyopathy ([place]) 08/28/2015   Primary osteoarthritis of both knees 01/27/2015   Diabetes mellitus type II, controlled, with no complications ([place]) 92/04/9416   Low TSH level 01/02/2015   HTN (hypertension) 04/04/2014   PCP:  Verl Bangs, FNP Pharmacy:   [facility] [address] [place] [address] Phone: [phone] Fax: [place], [place] [address] [address] Phone: [phone] Fax: [place], [place] - [address]  [place] [address] Phone: [phone] Fax: [phone]  Readmission Risk Interventions No flowsheet data found.

## 2019-11-17 NOTE — ED Notes (Signed)
RN called nursing facility and tried to attain more information however RN was directed to voicemail with operating hours between 0800 and 1700. RN did leave name and instructions to call [facility] ED as soon as possible.

## 2019-11-17 NOTE — ED Notes (Signed)
RN spoke with Tammy who is the Resident Care Coordinator. Tammy stated that the Physical Therapist had completed an assessment today and stated that patient is not mobile at all currently, screams in agony when attempted to move, and is no longer a one person assist.   Recommendation was Short Term or Acute Care Rehab facility and Tammy also stated that once patient completes Rehab if that is an option the patient will be accepted back to [place].

## 2019-11-17 NOTE — ED Notes (Signed)
ED Meal provided to patient.

## 2019-11-17 NOTE — ED Triage Notes (Signed)
Patient arrived via [place]. Patient is Aox4 and not ambulatory at this time. Physical Therapist called 911 due to patient pain being in severe pain. Patient was seen at facility 7-10 days ago for a fall with multiple fractures. Anytime patient attempts to ambulate or move patient is in severe pain.

## 2019-11-17 NOTE — ED Provider Notes (Signed)
[facility] Emergency Department Provider Note  Time seen: 3:51 PM  I have reviewed the triage vital signs and the nursing notes.   HISTORY  Chief Complaint Pain Control   HPI Carolyn Hernandez is a 86 y.o. female with a past medical history of arthritis, depression, falls, hypertension, diabetes, presents to the emergency department for pelvic pain.  According to the daughter the patient fell several days ago at her nursing facility.  Patient was seen in the emergency department initially had x-rays showing fractures in the CT scan that did not appear to show any acute fracture.  Patient has been continuing to have pain in her pelvis and right hip.  Daughter states at baseline patient lives at her nursing facility is minimally ambulatory as she has chronic pain in her legs and knees at baseline.  However they called for EMS today due to more severe pain.  Here the patient appears well.  She has old bruising to her face and right hand.  No new falls.  Calm, no distress or discomfort apparent at this time.  Past Medical History:  Diagnosis Date  . Arthritis   . Depression   . Fall   . Hypertension     Patient Active Problem List   Diagnosis Date Noted  . Pressure injury of skin 11/12/2019  . Dementia ([place]) 12/29/2017  . ARF (acute renal failure) ([place]) 12/23/2017  . Macular degeneration 10/06/2017  . Moderate single current episode of major depressive disorder ([place]) 10/06/2017  . Murmur, heart 09/02/2016  . Pedal edema 11/20/2015  . Dilated cardiomyopathy ([place]) 08/28/2015  . Primary osteoarthritis of both knees 01/27/2015  . Diabetes mellitus type II, controlled, with no complications ([place]) 25/85/2778  . Low TSH level 01/02/2015  . HTN (hypertension) 04/04/2014    Past Surgical History:  Procedure Laterality Date  . ABDOMINAL HYSTERECTOMY    . APPENDECTOMY    . TOTAL HIP ARTHROPLASTY      Prior to Admission medications   Medication Sig Start Date  End Date Taking? Authorizing Provider  amLODipine (NORVASC) 10 MG tablet TAKE ONE TABLET EVERY DAY 09/29/19   Malfi, Lupita Raider, FNP  cephALEXin (KEFLEX) 500 MG capsule Take 1 capsule (500 mg total) by mouth 3 (three) times daily for 10 days. 11/12/19 11/22/19  Nena Polio, MD  citalopram (CELEXA) 20 MG tablet Take 1 tablet (20 mg total) by mouth daily. 08/10/18   Mikey College, NP  diclofenac sodium (VOLTAREN) 1 % GEL Apply 2 g topically 4 (four) times daily. 08/10/18   Mikey College, NP  donepezil (ARICEPT) 5 MG tablet TAKE 1 TABLET AT BEDTIME 05/31/19   Karamalegos, Devonne Doughty, DO  hydrochlorothiazide (HYDRODIURIL) 12.5 MG tablet TAKE 1 TABLET BY MOUTH DAILY 07/19/19   Parks Ranger, Devonne Doughty, DO  HYDROcodone-acetaminophen (NORCO/VICODIN) 5-325 MG tablet Take 1 tablet by mouth every 6 (six) hours as needed for moderate pain. 11/12/19   Nena Polio, MD  lisinopril (ZESTRIL) 40 MG tablet TAKE 1 TABLET BY MOUTH DAILY 09/28/19   Malfi, Lupita Raider, FNP  metoprolol tartrate (LOPRESSOR) 50 MG tablet Take 1 tablet (50 mg total) by mouth 2 (two) times daily. 08/10/18   Mikey College, NP    No Known Allergies  Family History  Problem Relation Age of Onset  . Liver disease Son   . Alcohol abuse Son   . Diabetes Daughter   . Heart disease Son     Social History Social History   Tobacco  Use  . Smoking status: Never Smoker  . Smokeless tobacco: Never Used  Substance Use Topics  . Alcohol use: No  . Drug use: No    Review of Systems per patient and mostly per daughter Constitutional: Negative for fever. Cardiovascular: Negative for chest pain. Respiratory: Negative for shortness of breath. Gastrointestinal: Negative for vomiting Musculoskeletal: Pelvis and right hip pain All other ROS negative  ____________________________________________   PHYSICAL EXAM:  VITAL SIGNS: ED Triage Vitals  Enc Vitals Group     BP 11/17/19 1356 (!) 152/50     Pulse Rate 11/17/19  1356 (!) 55     Resp 11/17/19 1356 20     Temp 11/17/19 1356 97.9 F (36.6 C)     Temp Source 11/17/19 1356 Oral     SpO2 11/17/19 1356 97 %     Weight 11/17/19 1354 175 lb 0.7 oz (79.4 kg)     Height 11/17/19 1354 5\' 1"  (1.549 m)     Head Circumference --      Peak Flow --      Pain Score 11/17/19 1353 4     Pain Loc --      Pain Edu? --      Excl. in [employer]? --     Constitutional: Alert and oriented. Well appearing and in no distress. Eyes: Normal exam ENT      Head: Older appearing bruising to her bilateral face and right forehead      Mouth/Throat: Mucous membranes are moist. Cardiovascular: Normal rate, regular rhythm.  Respiratory: Normal respiratory effort without tachypnea nor retractions. Breath sounds are clear  Gastrointestinal: Soft and nontender. No distention.   Musculoskeletal: Older appearing bruising to right hand.  Good range of motion.  Patient does have tenderness to range of motion of right hip. Neurologic:  Normal speech and language. No gross focal neurologic deficits  Skin:  Skin is warm, dry.  Old bruising as stated above. Psychiatric: Mood and affect are normal  ____________________________________________   INITIAL IMPRESSION / ASSESSMENT AND PLAN / ED COURSE  Pertinent labs & imaging results that were available during my care of the patient were reviewed by me and considered in my medical decision making (see chart for details).   Patient presents emergency department for pain in the pelvis and right hip.  States the pain has been much worse since the fall several days ago.  CT scan performed 11/12/2019 does not show any acute fracture.  Does show significant degenerative changes of the right hip which could very likely be the cause of her pain after an exacerbation from a fall.  We will discussed with [facility] facility their capabilities to care for the patient.  If they can adequately care for the patient we can place her on pain medication.  If  they cannot adequately care for the patient we will involve PT and social work.  Patient's work-up shows no significant findings today.  We were able to speak to [facility] facility.  They believe the patient needs a short-term or acute rehab and are unable to take the patient back in her current state.  We will keep the patient in the emergency department and have social work and physical therapy see the patient here to help with disposition to an appropriate facility.  Patient daughter agreeable.  Carolyn Hernandez was evaluated in Emergency Department on 11/17/2019 for the symptoms described in the history of present illness. She was evaluated in the context of the [place] COVID-19  pandemic, which necessitated consideration that the patient might be at risk for infection with the SARS-CoV-2 virus that causes COVID-19. Institutional protocols and algorithms that pertain to the evaluation of patients at risk for COVID-19 are in a state of rapid change based on information released by regulatory bodies including the CDC and federal and state organizations. These policies and algorithms were followed during the patient's care in the ED.  ____________________________________________   FINAL CLINICAL IMPRESSION(S) / ED DIAGNOSES  Hip pain   Harvest Dark, MD 11/17/19 1725

## 2019-11-18 DIAGNOSIS — M25551 Pain in right hip: Secondary | ICD-10-CM | POA: Diagnosis not present

## 2019-11-18 NOTE — ED Provider Notes (Signed)
-----------------------------------------   7:56 AM on 11/18/2019 -----------------------------------------  Patient stable overnight.  Home meds already ordered.  Awaiting PT/OT evaluation and social work placement recommendations.   Hinda Kehr, MD 11/18/19 [phone]

## 2019-11-18 NOTE — ED Notes (Addendum)
Report received from [facility]. Patient care assumed. Patient/RN introduction complete. Pt eating dinner at this time without complaints of pain or discomfort. Purewick in place wrist splint noted to left wrist. Sitter at bedside. Pt awaiting SNF placement, will continue to monitor.

## 2019-11-18 NOTE — NC FL2 (Signed)
[place] LEVEL OF CARE SCREENING TOOL     IDENTIFICATION  Patient Name: Carolyn Hernandez Birthdate: 07/18/1929 Sex: female Admission Date (Current Location): 11/17/2019  [place] and [place] Number:  Selena Lesser [id number] [place] and Address:  [facility], [address] [place], [place] [address]      Provider Number: [id number]  Attending Physician Name and Address:  Earleen Newport, MD  Relative Name and Phone Number:  Renaldo Harrison (POA/son) [phone]    Current Level of Care: Hospital Recommended Level of Care: [place] Prior Approval Number:    Date Approved/Denied:   PASRR Number: [id number]  Discharge Plan: SNF    Current Diagnoses: Patient Active Problem List   Diagnosis Date Noted  . Pressure injury of skin 11/12/2019  . Dementia ([place]) 12/29/2017  . ARF (acute renal failure) ([place]) 12/23/2017  . Macular degeneration 10/06/2017  . Moderate single current episode of major depressive disorder ([place]) 10/06/2017  . Murmur, heart 09/02/2016  . Pedal edema 11/20/2015  . Dilated cardiomyopathy ([place]) 08/28/2015  . Primary osteoarthritis of both knees 01/27/2015  . Diabetes mellitus type II, controlled, with no complications ([place]) 07/68/0881  . Low TSH level 01/02/2015  . HTN (hypertension) 04/04/2014    Orientation RESPIRATION BLADDER Height & Weight     Self, Time, Situation, Place  Normal Incontinent Weight: 175 lb 0.7 oz (79.4 kg) Height:  5\' 1"  (154.9 cm)  BEHAVIORAL SYMPTOMS/MOOD NEUROLOGICAL BOWEL NUTRITION STATUS      Incontinent Diet  AMBULATORY STATUS COMMUNICATION OF NEEDS Skin   Limited Assist Verbally Normal                       Personal Care Assistance Level of Assistance              Functional Limitations Info             SPECIAL CARE FACTORS FREQUENCY  PT (By licensed PT)     PT Frequency: Receiving PT services at ALF              Contractures  Contractures Info: Not present    Additional Factors Info                  Current Medications (11/18/2019):  This is the current hospital active medication list Current Facility-Administered Medications  Medication Dose Route Frequency Provider Last Rate Last Admin  . amLODipine (NORVASC) tablet 10 mg  10 mg Oral Daily Harvest Dark, MD   10 mg at 11/18/19 1025  . cephALEXin (KEFLEX) capsule 500 mg  500 mg Oral TID Harvest Dark, MD   500 mg at 11/18/19 1025  . citalopram (CELEXA) tablet 20 mg  20 mg Oral Daily Harvest Dark, MD   20 mg at 11/18/19 1026  . donepezil (ARICEPT) tablet 5 mg  5 mg Oral QHS Harvest Dark, MD   5 mg at 11/18/19 0049  . hydrochlorothiazide (HYDRODIURIL) tablet 12.5 mg  12.5 mg Oral Daily Harvest Dark, MD   12.5 mg at 11/18/19 1026  . HYDROcodone-acetaminophen (NORCO/VICODIN) 5-325 MG per tablet 1 tablet  1 tablet Oral Q6H PRN Harvest Dark, MD   1 tablet at 11/18/19 0050  . lisinopril (ZESTRIL) tablet 40 mg  40 mg Oral Daily Harvest Dark, MD   40 mg at 11/18/19 1026  . metoprolol tartrate (LOPRESSOR) tablet 50 mg  50 mg Oral BID Harvest Dark, MD   50 mg at 11/18/19 1026   Current Outpatient  Medications  Medication Sig Dispense Refill  . amLODipine (NORVASC) 10 MG tablet TAKE ONE TABLET EVERY DAY 90 tablet 0  . cephALEXin (KEFLEX) 500 MG capsule Take 1 capsule (500 mg total) by mouth 3 (three) times daily for 10 days. 30 capsule 0  . citalopram (CELEXA) 20 MG tablet Take 1 tablet (20 mg total) by mouth daily. 90 tablet 3  . diclofenac sodium (VOLTAREN) 1 % GEL Apply 2 g topically 4 (four) times daily. 100 g 11  . donepezil (ARICEPT) 5 MG tablet TAKE 1 TABLET AT BEDTIME 90 tablet 1  . hydrochlorothiazide (HYDRODIURIL) 12.5 MG tablet TAKE 1 TABLET BY MOUTH DAILY 30 tablet 2  . HYDROcodone-acetaminophen (NORCO/VICODIN) 5-325 MG tablet Take 1 tablet by mouth every 6 (six) hours as needed for moderate pain. 30 tablet 0  .  lisinopril (ZESTRIL) 40 MG tablet TAKE 1 TABLET BY MOUTH DAILY 90 tablet 1  . metoprolol tartrate (LOPRESSOR) 50 MG tablet Take 1 tablet (50 mg total) by mouth 2 (two) times daily. 180 tablet 3     Discharge Medications: Please see discharge summary for a list of discharge medications.  Relevant Imaging Results:  Relevant Lab Results:   Additional Information SS# [id number]  Adelene Amas, LCSWA

## 2019-11-18 NOTE — ED Notes (Signed)
Pt assisted to toilet and back to bed by this sitter and Gabby NT. Pt comfortable and resting at this time.

## 2019-11-18 NOTE — TOC Progression Note (Signed)
Transition of Care [facility]) - Progression Note    Patient Details  Name: ADAMARI FREDE MRN: [id number] Date of Birth: 03-Nov-1929  [facility]) CM/SW Contact  Anselm Pancoast, RN Phone Number: 11/18/2019, 11:53 AM  Clinical Narrative:    Spoke with daughter, [name] [phone] at bedside regarding potential placement. Family is hopeful for transfer to [facility] with [facility] as a second choice. Agreeable to [facility] if no other option. Discussed need for PT eval prior to sending SNF request. Will need insurance approval as well. Patient is a resident of [facility] and daughter states she was notified by [facility] they had spoken to [facility] and confirmed they had availability.    Expected Discharge Plan: [place] Barriers to Discharge: Continued Medical Work up, ED SNF auth  Expected Discharge Plan and Services Expected Discharge Plan: [place] Choice: [place] Living arrangements for the past 2 months: Assisted Living Facility([name] (memory care unit))                                       Social Determinants of Health (SDOH) Interventions    Readmission Risk Interventions No flowsheet data found.

## 2019-11-18 NOTE — ED Notes (Signed)
Wrist splint applied to left wrist with no issue.

## 2019-11-18 NOTE — ED Notes (Signed)
No change in condition, will continue to monitor.  

## 2019-11-18 NOTE — TOC Progression Note (Addendum)
Transition of Care [facility]) - Progression Note    Patient Details  Name: Carolyn Hernandez MRN: [id number] Date of Birth: 09/24/1929  Transition of Care [facility]) CM/SW [place], [place] Phone Number: 11/18/2019, 4:13 PM  Clinical Narrative:     CSW sent FL2 via hub to [facility] (preferred), [facility] and [facility], pending bed offers.  Once a SNF bed offer is given, then we'll need an insurance authorization.  Expected Discharge Plan: [place] Barriers to Discharge: Continued Medical Work up, ED SNF auth  Expected Discharge Plan and Services Expected Discharge Plan: [place] Choice: [place] Living arrangements for the past 2 months: Assisted Living Facility([name] (memory care unit))                                       Social Determinants of Health (SDOH) Interventions    Readmission Risk Interventions No flowsheet data found.

## 2019-11-18 NOTE — Evaluation (Signed)
Physical Therapy Evaluation Patient Details Name: Carolyn Hernandez MRN: [id number] DOB: 1929/08/13 Today's Date: 11/18/2019   History of Present Illness  Pt is an 86 y.o. female with a past medical history of arthritis, depression, falls, hypertension, diabetes, presents to the emergency department for pelvic pain.  According to the daughter the patient fell several days ago at her nursing facility.  Patient was seen in the emergency department initially had x-rays showing fractures in the CT scan that did not appear to show any acute fracture.  Patient has been continuing to have pain in her pelvis and right hip.  Daughter states at baseline patient lives at her nursing facility is minimally ambulatory as she has chronic pain in her legs and knees at baseline.  Per chart review from recent prior admission pt found to have a distal L radius fracture and was fitted with a splint.    Clinical Impression  Pt awake and alert but confused and alert to self only.  Pt was able to follow simple one-step commands most of the time with extra time to process and with multi-modal cuing.  Pt required extensive +2 physical assistance with all functional tasks and was only able to take several very small steps at the EOB before requiring to return to sitting.  Per chart review pt was previously ambulatory at her ALF.  Pt is at a very high risk for falls and would not be safe to return to her prior living situation at this time.  Pt will benefit from PT services in a SNF setting upon discharge to safely address deficits listed in patient problem list for decreased caregiver assistance and eventual return to PLOF.     Follow Up Recommendations SNF    Equipment Recommendations  Other (comment)(TBD at next venue of care)    Recommendations for Other Services       Precautions / Restrictions Precautions Precautions: Fall Restrictions Other Position/Activity Restrictions: No orders in chart regarding WB  restrictions but per MD note 11/12/19 pt found to have a distal L radius fracture and was fitted with a splint that was not found with pt this admission, MD aware and nursing to obtain splint for pt.      Mobility  Bed Mobility Overal bed mobility: Needs Assistance Bed Mobility: Sit to Supine;Supine to Sit     Supine to sit: +2 for physical assistance;Max assist Sit to supine: Max assist;+2 for physical assistance   General bed mobility comments: Pt participated only minimally with bed mobiltiy tasks  Transfers Overall transfer level: Needs assistance Equipment used: 2 person hand held assist Transfers: Sit to/from Stand Sit to Stand: +2 physical assistance;Mod assist         General transfer comment: RUE HHA and LUE support at the elbow seconary to L radial fracture  Ambulation/Gait Ambulation/Gait assistance: +2 physical assistance;Mod assist;Max assist Gait Distance (Feet): 3 Feet Assistive device: 2 person hand held assist Gait Pattern/deviations: Step-to pattern;Trunk flexed Gait velocity: decreased   General Gait Details: Heavy +2 assist to prevent LOB with pt only able to take several very small steps before requiring to return to sitting; very high fall risk  Stairs            Wheelchair Mobility    Modified Rankin (Stroke Patients Only)       Balance Overall balance assessment: Needs assistance   Sitting balance-Leahy Scale: Poor Sitting balance - Comments: Min A to prevent posterior LOB Postural control: Posterior lean Standing balance support: Bilateral  upper extremity supported Standing balance-Leahy Scale: Poor                               Pertinent Vitals/Pain Pain Assessment: Faces Pain Score: 4  Pain Location: L wrist Pain Intervention(s): Monitored during session    Home Living Family/patient expects to be discharged to:: Assisted living                 Additional Comments: Pt alert to self only and unable to  provide history, information obtained from chart review    Prior Function           Comments: Pt able to ambulate at ALF, unsure of how much assistance was required; h/o recent fall(s)     Hand Dominance        Extremity/Trunk Assessment   Upper Extremity Assessment Upper Extremity Assessment: Generalized weakness    Lower Extremity Assessment Lower Extremity Assessment: Generalized weakness       Communication   Communication: No difficulties  Cognition Arousal/Alertness: Awake/alert Behavior During Therapy: WFL for tasks assessed/performed Overall Cognitive Status: No family/caregiver present to determine baseline cognitive functioning                                 General Comments: Pt alert to self only and required extra time to recall her last name      General Comments      Exercises Other Exercises Other Exercises: BLE supine ankle, knee, hip AA/PROM to pt's tolerance   Assessment/Plan    PT Assessment Patient needs continued PT services  PT Problem List Decreased strength;Decreased activity tolerance;Decreased balance;Decreased mobility;Decreased knowledge of use of DME;Pain;Decreased safety awareness       PT Treatment Interventions DME instruction;Gait training;Functional mobility training;Therapeutic activities;Therapeutic exercise;Balance training;Patient/family education    PT Goals (Current goals can be found in the Care Plan section)  Acute Rehab PT Goals PT Goal Formulation: Patient unable to participate in goal setting Time For Goal Achievement: 12/01/19 Potential to Achieve Goals: Fair    Frequency Min 2X/week   Barriers to discharge Inaccessible home environment;Decreased caregiver support      Co-evaluation               AM-PAC PT "6 Clicks" Mobility  Outcome Measure Help needed turning from your back to your side while in a flat bed without using bedrails?: Total Help needed moving from lying on your back  to sitting on the side of a flat bed without using bedrails?: Total Help needed moving to and from a bed to a chair (including a wheelchair)?: Total Help needed standing up from a chair using your arms (e.g., wheelchair or bedside chair)?: Total Help needed to walk in hospital room?: Total Help needed climbing 3-5 steps with a railing? : Total 6 Click Score: 6    End of Session Equipment Utilized During Treatment: Gait belt Activity Tolerance: Patient tolerated treatment well Patient left: in bed;with call bell/phone within reach;with nursing/sitter in room;Other (comment)(Bil rails up as found in ER) Nurse Communication: Mobility status PT Visit Diagnosis: Unsteadiness on feet (R26.81);History of falling (Z91.81);Muscle weakness (generalized) (M62.81);Difficulty in walking, not elsewhere classified (R26.2);Pain Pain - Right/Left: Left Pain - part of body: Hand    Time: 1355-1424 PT Time Calculation (min) (ACUTE ONLY): 29 min   Charges:   PT Evaluation $PT Eval Moderate Complexity: 1 Mod PT Treatments $  Therapeutic Exercise: 8-22 mins        D. Royetta Asal PT, DPT 11/18/19, 5:10 PM

## 2019-11-18 NOTE — ED Notes (Signed)
Pt eating dinner now. Pt does better with Hot meal try from downstairs rather than boxed dinners.

## 2019-11-18 NOTE — ED Notes (Addendum)
Pt resting comfortably with eyes closed, no distress noted. Sitter at bedside.

## 2019-11-19 DIAGNOSIS — M25551 Pain in right hip: Secondary | ICD-10-CM | POA: Diagnosis not present

## 2019-11-19 NOTE — ED Notes (Signed)
Pt with two small excoriated places bilateral buttocks. Tan pressure relieving dsg applied. Pure wick in place and functioning properly.

## 2019-11-19 NOTE — ED Notes (Signed)
Pt had small BM, changes brief and repositioned in bed.As

## 2019-11-19 NOTE — TOC Progression Note (Signed)
Transition of Care [facility]) - Progression Note    Patient Details  Name: Carolyn Hernandez MRN: [id number] Date of Birth: 1929-10-10  Transition of Care [facility]) CM/SW [place], [place] Phone Number: 11/19/2019, 1:28 PM  Clinical Narrative:     [employer] authorization started, bed available at [facility] as per Linus Orn [phone].  This CSW spoke w/ Mr. Flatley (son/POA), with status update and he stated he would contact his sister Ms. Smalls and update her.  Expected Discharge Plan: [place] Barriers to Discharge: Continued Medical Work up, ED SNF auth  Expected Discharge Plan and Services Expected Discharge Plan: [place] Choice: [place] Living arrangements for the past 2 months: Assisted Living Facility([name] (memory care unit))                                       Social Determinants of Health (SDOH) Interventions    Readmission Risk Interventions No flowsheet data found.

## 2019-11-19 NOTE — ED Provider Notes (Addendum)
Vitals:   11/19/19 1336 11/19/19 1337  BP:    Pulse: (!) 54 (!) 52  Resp:    Temp:    SpO2: 96% 97%    Patient resting when evaluated this morning ~ 730AM. Awaiting disposition pending case management recommendations.   On going care and disposition assigned to Dr. Corky Downs.    Delman Kitten, MD 11/19/19 1530    Delman Kitten, MD 11/19/19 1531

## 2019-11-19 NOTE — ED Notes (Addendum)
While changing found a small skin tear on bottom (left side), informed RN Rush Landmark, Rn came in to look and placed a preventative pad on bottom.

## 2019-11-19 NOTE — ED Notes (Signed)
No change in condition, pt resting with eyes closed. Safety sitter at bedside.

## 2019-11-19 NOTE — ED Notes (Signed)
Pt currently awake and visiting with daughter at bedside.

## 2019-11-19 NOTE — NC FL2 (Signed)
[place] LEVEL OF CARE SCREENING TOOL     IDENTIFICATION  Patient Name: Carolyn Hernandez Birthdate: 1930-05-06 Sex: female Admission Date (Current Location): 11/17/2019  [place] and [place] Number:  Selena Lesser [phone] [place] and Address:  [facility], [address] [place], [place] [address]      Provider Number: [id number]  Attending Physician Name and Address:  No att. providers found  Relative Name and Phone Number:  Renaldo Harrison (POA/son) [phone]    Current Level of Care: Hospital Recommended Level of Care: [place] Prior Approval Number:    Date Approved/Denied:   PASRR Number: [id number]  Discharge Plan: SNF    Current Diagnoses: Patient Active Problem List   Diagnosis Date Noted  . Pressure injury of skin 11/12/2019  . Dementia ([place]) 12/29/2017  . ARF (acute renal failure) ([place]) 12/23/2017  . Macular degeneration 10/06/2017  . Moderate single current episode of major depressive disorder ([place]) 10/06/2017  . Murmur, heart 09/02/2016  . Pedal edema 11/20/2015  . Dilated cardiomyopathy ([place]) 08/28/2015  . Primary osteoarthritis of both knees 01/27/2015  . Diabetes mellitus type II, controlled, with no complications ([place]) 25/36/6440  . Low TSH level 01/02/2015  . HTN (hypertension) 04/04/2014    Orientation RESPIRATION BLADDER Height & Weight     Self, Time, Situation, Place  Normal Incontinent Weight: 175 lb 0.7 oz (79.4 kg) Height:  5\' 1"  (154.9 cm)  BEHAVIORAL SYMPTOMS/MOOD NEUROLOGICAL BOWEL NUTRITION STATUS      Incontinent Diet  AMBULATORY STATUS COMMUNICATION OF NEEDS Skin   Extensive Assist Verbally Normal                       Personal Care Assistance Level of Assistance              Functional Limitations Info             SPECIAL CARE FACTORS FREQUENCY  PT (By licensed PT)         PT 5X a week            Contractures Contractures Info: Not present     Additional Factors Info                  Current Medications (11/19/2019):  This is the current hospital active medication list Current Facility-Administered Medications  Medication Dose Route Frequency Provider Last Rate Last Admin  . amLODipine (NORVASC) tablet 10 mg  10 mg Oral Daily Harvest Dark, MD   10 mg at 11/19/19 1030  . cephALEXin (KEFLEX) capsule 500 mg  500 mg Oral TID Harvest Dark, MD   500 mg at 11/19/19 1030  . citalopram (CELEXA) tablet 20 mg  20 mg Oral Daily Harvest Dark, MD   20 mg at 11/19/19 1030  . donepezil (ARICEPT) tablet 5 mg  5 mg Oral QHS Harvest Dark, MD   5 mg at 11/18/19 2154  . hydrochlorothiazide (HYDRODIURIL) tablet 12.5 mg  12.5 mg Oral Daily Harvest Dark, MD   12.5 mg at 11/18/19 1026  . HYDROcodone-acetaminophen (NORCO/VICODIN) 5-325 MG per tablet 1 tablet  1 tablet Oral Q6H PRN Harvest Dark, MD   1 tablet at 11/18/19 0050  . lisinopril (ZESTRIL) tablet 40 mg  40 mg Oral Daily Harvest Dark, MD   40 mg at 11/19/19 1030  . metoprolol tartrate (LOPRESSOR) tablet 50 mg  50 mg Oral BID Harvest Dark, MD   50 mg at 11/19/19 1030   Current Outpatient Medications  Medication Sig Dispense Refill  . diclofenac sodium (VOLTAREN) 1 % GEL Apply 2 g topically 4 (four) times daily. 100 g 11  . HYDROcodone-acetaminophen (NORCO/VICODIN) 5-325 MG tablet Take 1 tablet by mouth every 6 (six) hours as needed for moderate pain. 30 tablet 0  . metoprolol tartrate (LOPRESSOR) 50 MG tablet Take 1 tablet (50 mg total) by mouth 2 (two) times daily. 180 tablet 3  . amLODipine (NORVASC) 10 MG tablet TAKE ONE TABLET EVERY DAY (Patient not taking: Reported on 11/18/2019) 90 tablet 0  . cephALEXin (KEFLEX) 500 MG capsule Take 1 capsule (500 mg total) by mouth 3 (three) times daily for 10 days. 30 capsule 0  . citalopram (CELEXA) 20 MG tablet Take 1 tablet (20 mg total) by mouth daily. (Patient not taking: Reported on 11/18/2019) 90  tablet 3  . donepezil (ARICEPT) 5 MG tablet TAKE 1 TABLET AT BEDTIME (Patient not taking: Reported on 11/18/2019) 90 tablet 1  . hydrochlorothiazide (HYDRODIURIL) 12.5 MG tablet TAKE 1 TABLET BY MOUTH DAILY (Patient not taking: Reported on 11/18/2019) 30 tablet 2  . hydrOXYzine (VISTARIL) 25 MG capsule Take 25 mg by mouth 2 (two) times daily.    Marland Kitchen lisinopril (ZESTRIL) 40 MG tablet TAKE 1 TABLET BY MOUTH DAILY (Patient not taking: Reported on 11/18/2019) 90 tablet 1     Discharge Medications: Please see discharge summary for a list of discharge medications.  Relevant Imaging Results:  Relevant Lab Results:   Additional Information SS# [id number]  Adelene Amas, LCSWA

## 2019-11-19 NOTE — ED Notes (Addendum)
Pt provided a lunch meal tray and water.

## 2019-11-19 NOTE — TOC Progression Note (Signed)
Transition of Care [facility]) - Progression Note    Patient Details  Name: Carolyn Hernandez MRN: [id number] Date of Birth: 1930/06/09  Transition of Care [facility]) CM/SW [place], [place] Phone Number: 11/19/2019, 11:15 AM  Clinical Narrative:     CSW spoke with patient's son Dynisha [facility]) [phone], and patient's daughter [name] [phone], with update of SNF placement.  This CSW let them know the patient has been declined for placement at [facility] but has been accepted at [facility].  This CSW informed Mr. [place] Silos and Ms. Small, I will reach out to [facility] to ensure they will be able to take the patient and begin insurance authorization process.  Mr. Pizano let this CSW know the patient has been approved for Medicaid and gave this CSW the patient's Medicaid PX#[id number]. This CSW let Mr. [place] Silos and Ms. Small know I would contact them w/ status update.   Expected Discharge Plan: [place] Barriers to Discharge: Continued Medical Work up, ED SNF auth  Expected Discharge Plan and Services Expected Discharge Plan: [place] Choice: [place] Living arrangements for the past 2 months: Assisted Living Facility([name] (memory care unit))                                       Social Determinants of Health (SDOH) Interventions    Readmission Risk Interventions No flowsheet data found.

## 2019-11-19 NOTE — ED Notes (Signed)
Patient in bed resting, no complaints

## 2019-11-19 NOTE — ED Notes (Signed)
Nursing secretary Holley Raring will put a consult in for a hospital bed

## 2019-11-19 NOTE — ED Notes (Signed)
Patient placed in hospital bed for comfort.

## 2019-11-19 NOTE — ED Notes (Signed)
This tech as 1:1 sitter; pt asleep at this time.

## 2019-11-20 DIAGNOSIS — M25551 Pain in right hip: Secondary | ICD-10-CM | POA: Diagnosis not present

## 2019-11-20 NOTE — ED Notes (Signed)
Patient notified of having BM, stool was very loose/watery.AS

## 2019-11-20 NOTE — ED Provider Notes (Signed)
-----------------------------------------   5:35 AM on 11/20/2019 -----------------------------------------   Blood pressure (!) 157/47, pulse (!) 55, temperature 98.4 F (36.9 C), temperature source Oral, resp. rate 14, height 5\' 1"  (1.549 m), weight 79.4 kg, SpO2 95 %.  The patient is resting at this time.  There have been no acute events since the last update.  Awaiting disposition plan from Social Work team.   Paulette Blanch, MD 11/20/19 [phone]

## 2019-11-20 NOTE — ED Notes (Signed)
Family sitting with patient

## 2019-11-20 NOTE — ED Notes (Signed)
Family no longer sitting with patient. Pt resting in NAD. Wakes easily to take meds whole with water. Denies further needs at this time.

## 2019-11-20 NOTE — ED Notes (Signed)
Pt given sandwich tray and water. Pt ate 80%.

## 2019-11-21 DIAGNOSIS — M25551 Pain in right hip: Secondary | ICD-10-CM | POA: Diagnosis not present

## 2019-11-21 NOTE — ED Notes (Signed)
Pt cleaned of stool and new pads, brief placed. Pt confused.  Multiple bruises noted to face, neck, extremities.  Pt pulled up and repositioned in bed. Drinks at bedside table. Fall alarm on.  Call light within reach.

## 2019-11-21 NOTE — ED Notes (Signed)
Pt eating dinner tray with daughter. Pt ate approx 50% of meal tray.

## 2019-11-21 NOTE — TOC Progression Note (Signed)
Transition of Care [facility]) - Progression Note    Patient Details  Name: Carolyn Hernandez MRN: [id number] Date of Birth: 26-Dec-1929  Transition of Care [facility]) CM/SW Contact  Anselm Pancoast, RN Phone Number: 11/21/2019, 9:22 AM  Clinical Narrative:    Spoke to daughter, [name], updated patient is still waiting for insurance authorization. Daughter concerned about patient not getting up from bed and how that will affect her skin integrity. Daughter states she talked with ED nurse yesterday about her concerns. Daughter also expressed concerns regarding patient not eating and states she will be coming to hospital to try and feed patient. No other needs or concerns at this time. TOC will update as new information available.    Expected Discharge Plan: [place] Barriers to Discharge: Continued Medical Work up, ED SNF auth  Expected Discharge Plan and Services Expected Discharge Plan: [place] Choice: [place] Living arrangements for the past 2 months: Assisted Living Facility([name] (memory care unit))                                       Social Determinants of Health (SDOH) Interventions    Readmission Risk Interventions No flowsheet data found.

## 2019-11-21 NOTE — Progress Notes (Signed)
Physical Therapy Treatment Patient Details Name: Carolyn Hernandez MRN: [id number] DOB: 1929/11/16 Today's Date: 11/21/2019    History of Present Illness Pt is an 86 y.o. female with a past medical history of arthritis, depression, falls, hypertension, diabetes, presents to the emergency department for pelvic pain.  According to the daughter the patient fell several days ago at her nursing facility.  Patient was seen in the emergency department initially had x-rays showing fractures in the CT scan that did not appear to show any acute fracture.  Patient has been continuing to have pain in her pelvis and right hip.  Daughter states at baseline patient lives at her nursing facility is minimally ambulatory as she has chronic pain in her legs and knees at baseline.  Per chart review from recent prior admission pt found to have a distal L radius fracture and was fitted with a splint.    PT Comments    Patient agrees to PT treatment. She reports no pain . She performs BLE exercises AAAROM.She needs mod assist for supine <> sit bed mobility and mod assist for sit to stand transfer with RW. She is able to stand at [facility] for a minute with mod assist x 1.  She is able to scoot in sitting  up towards the head of the bed. She has fair sitting and fair standing balance. She is not able to ambulate today due to needing more assistance.She will continue to benefit from skilled PT to improve mobility and strength.    Follow Up Recommendations  SNF     Equipment Recommendations  Rolling walker with 5" wheels    Recommendations for Other Services       Precautions / Restrictions Precautions Precautions: Fall Restrictions Other Position/Activity Restrictions: No orders in chart regarding WB restrictions but per MD note 11/12/19 pt found to have a distal L radius fracture and was fitted with a splint that was not found with pt this admission, MD aware and nursing to obtain splint for pt.    Mobility  Bed  Mobility Overal bed mobility: Needs Assistance Bed Mobility: Supine to Sit;Sit to Supine     Supine to sit: Mod assist Sit to supine: Mod assist   General bed mobility comments: needs extra time  Transfers Overall transfer level: Needs assistance Equipment used: 1 person hand held assist Transfers: Sit to/from Stand Sit to Stand: Mod assist         General transfer comment: RUE HHA and LUE support at the elbow seconary to L radial fracture  Ambulation/Gait Ambulation/Gait assistance: (needs a 2 person assist for ambulation)               Stairs             Wheelchair Mobility    Modified Rankin (Stroke Patients Only)       Balance Overall balance assessment: Needs assistance Sitting-balance support: Single extremity supported Sitting balance-Leahy Scale: Fair     Standing balance support: Bilateral upper extremity supported Standing balance-Leahy Scale: Fair                              Cognition Arousal/Alertness: Awake/alert Behavior During Therapy: WFL for tasks assessed/performed                                          Exercises Other  Exercises Other Exercises: BLE supine ankle, knee, hip AA/PROM to pt's tolerance    General Comments        Pertinent Vitals/Pain Pain Assessment: No/denies pain    Home Living                      Prior Function            PT Goals (current goals can now be found in the care plan section) Acute Rehab PT Goals PT Goal Formulation: Patient unable to participate in goal setting    Frequency    Min 2X/week      PT Plan Current plan remains appropriate    Co-evaluation              AM-PAC PT "6 Clicks" Mobility   Outcome Measure  Help needed turning from your back to your side while in a flat bed without using bedrails?: A Lot Help needed moving from lying on your back to sitting on the side of a flat bed without using bedrails?: A Lot Help  needed moving to and from a bed to a chair (including a wheelchair)?: A Lot Help needed standing up from a chair using your arms (e.g., wheelchair or bedside chair)?: Total Help needed to walk in hospital room?: Total Help needed climbing 3-5 steps with a railing? : Total 6 Click Score: 9    End of Session Equipment Utilized During Treatment: Gait belt Activity Tolerance: Patient tolerated treatment well Patient left: in bed;with call bell/phone within reach;with bed alarm set Nurse Communication: Mobility status PT Visit Diagnosis: Unsteadiness on feet (R26.81)     Time: 7510-2585 PT Time Calculation (min) (ACUTE ONLY): 1425 min  Charges:  $Therapeutic Activity: 23-37 mins                        Alanson Puls, PT DPT 11/21/2019, 2:45 PM

## 2019-11-21 NOTE — ED Notes (Signed)
Pt assisted to reposition in bed. Pt given apple juice. Pt denies further needs at this time.

## 2019-11-21 NOTE — ED Notes (Signed)
Attempted again to feed patient.  Will not eat biscuit or banana. Does not want anything different. Does not want juice. Did get to drink some water.

## 2019-11-21 NOTE — ED Notes (Addendum)
Pt offered breakfast tray and attempted to assist pt.  Declined and reports not hungry at this time.

## 2019-11-21 NOTE — ED Notes (Addendum)
Pt changed by this rn and kayla and alyssa EDTs. Pt incontinent of urine. Pt has one small bowel movement.

## 2019-11-21 NOTE — ED Notes (Signed)
Family at bedside. 

## 2019-11-21 NOTE — ED Notes (Signed)
Pt cleaned of stool and urine. New brief placed. New mepilex placed.  Pt repositioned in bed.

## 2019-11-21 NOTE — ED Notes (Signed)
Pt resting at this time, confused as to why she is here. Pt is redirectable at this time.

## 2019-11-22 DIAGNOSIS — Z043 Encounter for examination and observation following other accident: Secondary | ICD-10-CM | POA: Diagnosis present

## 2019-11-22 DIAGNOSIS — E878 Other disorders of electrolyte and fluid balance, not elsewhere classified: Secondary | ICD-10-CM | POA: Diagnosis not present

## 2019-11-22 DIAGNOSIS — D649 Anemia, unspecified: Secondary | ICD-10-CM | POA: Diagnosis not present

## 2019-11-22 DIAGNOSIS — R0902 Hypoxemia: Secondary | ICD-10-CM | POA: Diagnosis not present

## 2019-11-22 DIAGNOSIS — I420 Dilated cardiomyopathy: Secondary | ICD-10-CM | POA: Diagnosis not present

## 2019-11-22 DIAGNOSIS — M79642 Pain in left hand: Secondary | ICD-10-CM | POA: Diagnosis not present

## 2019-11-22 DIAGNOSIS — I499 Cardiac arrhythmia, unspecified: Secondary | ICD-10-CM | POA: Diagnosis not present

## 2019-11-22 DIAGNOSIS — N189 Chronic kidney disease, unspecified: Secondary | ICD-10-CM | POA: Diagnosis not present

## 2019-11-22 DIAGNOSIS — H353130 Nonexudative age-related macular degeneration, bilateral, stage unspecified: Secondary | ICD-10-CM | POA: Diagnosis not present

## 2019-11-22 DIAGNOSIS — K5900 Constipation, unspecified: Secondary | ICD-10-CM | POA: Diagnosis not present

## 2019-11-22 DIAGNOSIS — Z743 Need for continuous supervision: Secondary | ICD-10-CM | POA: Diagnosis not present

## 2019-11-22 DIAGNOSIS — S32511D Fracture of superior rim of right pubis, subsequent encounter for fracture with routine healing: Secondary | ICD-10-CM | POA: Diagnosis not present

## 2019-11-22 DIAGNOSIS — R6889 Other general symptoms and signs: Secondary | ICD-10-CM | POA: Diagnosis not present

## 2019-11-22 DIAGNOSIS — R7989 Other specified abnormal findings of blood chemistry: Secondary | ICD-10-CM | POA: Diagnosis not present

## 2019-11-22 DIAGNOSIS — Z79899 Other long term (current) drug therapy: Secondary | ICD-10-CM | POA: Diagnosis not present

## 2019-11-22 DIAGNOSIS — M170 Bilateral primary osteoarthritis of knee: Secondary | ICD-10-CM | POA: Diagnosis not present

## 2019-11-22 DIAGNOSIS — F0390 Unspecified dementia without behavioral disturbance: Secondary | ICD-10-CM | POA: Diagnosis not present

## 2019-11-22 DIAGNOSIS — R0989 Other specified symptoms and signs involving the circulatory and respiratory systems: Secondary | ICD-10-CM | POA: Diagnosis not present

## 2019-11-22 DIAGNOSIS — E119 Type 2 diabetes mellitus without complications: Secondary | ICD-10-CM | POA: Diagnosis not present

## 2019-11-22 DIAGNOSIS — S6992XA Unspecified injury of left wrist, hand and finger(s), initial encounter: Secondary | ICD-10-CM | POA: Diagnosis not present

## 2019-11-22 DIAGNOSIS — Z751 Person awaiting admission to adequate facility elsewhere: Secondary | ICD-10-CM | POA: Diagnosis not present

## 2019-11-22 DIAGNOSIS — H05223 Edema of bilateral orbit: Secondary | ICD-10-CM | POA: Diagnosis not present

## 2019-11-22 DIAGNOSIS — M79604 Pain in right leg: Secondary | ICD-10-CM | POA: Diagnosis not present

## 2019-11-22 DIAGNOSIS — Z7401 Bed confinement status: Secondary | ICD-10-CM | POA: Diagnosis not present

## 2019-11-22 DIAGNOSIS — R791 Abnormal coagulation profile: Secondary | ICD-10-CM | POA: Diagnosis not present

## 2019-11-22 DIAGNOSIS — S199XXA Unspecified injury of neck, initial encounter: Secondary | ICD-10-CM | POA: Diagnosis not present

## 2019-11-22 DIAGNOSIS — E039 Hypothyroidism, unspecified: Secondary | ICD-10-CM | POA: Diagnosis not present

## 2019-11-22 DIAGNOSIS — S3993XA Unspecified injury of pelvis, initial encounter: Secondary | ICD-10-CM | POA: Diagnosis not present

## 2019-11-22 DIAGNOSIS — M79641 Pain in right hand: Secondary | ICD-10-CM | POA: Diagnosis not present

## 2019-11-22 DIAGNOSIS — R7981 Abnormal blood-gas level: Secondary | ICD-10-CM | POA: Diagnosis not present

## 2019-11-22 DIAGNOSIS — M6281 Muscle weakness (generalized): Secondary | ICD-10-CM | POA: Diagnosis not present

## 2019-11-22 DIAGNOSIS — D72829 Elevated white blood cell count, unspecified: Secondary | ICD-10-CM | POA: Diagnosis not present

## 2019-11-22 DIAGNOSIS — R262 Difficulty in walking, not elsewhere classified: Secondary | ICD-10-CM | POA: Diagnosis not present

## 2019-11-22 DIAGNOSIS — M25552 Pain in left hip: Secondary | ICD-10-CM | POA: Diagnosis not present

## 2019-11-22 DIAGNOSIS — R404 Transient alteration of awareness: Secondary | ICD-10-CM | POA: Diagnosis not present

## 2019-11-22 DIAGNOSIS — L89152 Pressure ulcer of sacral region, stage 2: Secondary | ICD-10-CM | POA: Diagnosis not present

## 2019-11-22 DIAGNOSIS — R319 Hematuria, unspecified: Secondary | ICD-10-CM | POA: Diagnosis not present

## 2019-11-22 DIAGNOSIS — S52502D Unspecified fracture of the lower end of left radius, subsequent encounter for closed fracture with routine healing: Secondary | ICD-10-CM | POA: Diagnosis not present

## 2019-11-22 DIAGNOSIS — I10 Essential (primary) hypertension: Secondary | ICD-10-CM | POA: Diagnosis not present

## 2019-11-22 DIAGNOSIS — M25551 Pain in right hip: Secondary | ICD-10-CM | POA: Diagnosis not present

## 2019-11-22 DIAGNOSIS — S79911A Unspecified injury of right hip, initial encounter: Secondary | ICD-10-CM | POA: Diagnosis not present

## 2019-11-22 DIAGNOSIS — J189 Pneumonia, unspecified organism: Secondary | ICD-10-CM | POA: Diagnosis not present

## 2019-11-22 DIAGNOSIS — W1830XA Fall on same level, unspecified, initial encounter: Secondary | ICD-10-CM | POA: Diagnosis not present

## 2019-11-22 DIAGNOSIS — G8929 Other chronic pain: Secondary | ICD-10-CM | POA: Diagnosis not present

## 2019-11-22 DIAGNOSIS — E559 Vitamin D deficiency, unspecified: Secondary | ICD-10-CM | POA: Diagnosis not present

## 2019-11-22 DIAGNOSIS — W19XXXA Unspecified fall, initial encounter: Secondary | ICD-10-CM | POA: Diagnosis not present

## 2019-11-22 DIAGNOSIS — N390 Urinary tract infection, site not specified: Secondary | ICD-10-CM | POA: Diagnosis not present

## 2019-11-22 DIAGNOSIS — R519 Headache, unspecified: Secondary | ICD-10-CM | POA: Diagnosis not present

## 2019-11-22 DIAGNOSIS — I517 Cardiomegaly: Secondary | ICD-10-CM | POA: Diagnosis not present

## 2019-11-22 DIAGNOSIS — S32512D Fracture of superior rim of left pubis, subsequent encounter for fracture with routine healing: Secondary | ICD-10-CM | POA: Diagnosis not present

## 2019-11-22 DIAGNOSIS — M2550 Pain in unspecified joint: Secondary | ICD-10-CM | POA: Diagnosis not present

## 2019-11-22 DIAGNOSIS — M6259 Muscle wasting and atrophy, not elsewhere classified, multiple sites: Secondary | ICD-10-CM | POA: Diagnosis not present

## 2019-11-22 DIAGNOSIS — S93402D Sprain of unspecified ligament of left ankle, subsequent encounter: Secondary | ICD-10-CM | POA: Diagnosis not present

## 2019-11-22 DIAGNOSIS — N179 Acute kidney failure, unspecified: Secondary | ICD-10-CM | POA: Diagnosis not present

## 2019-11-22 DIAGNOSIS — M79602 Pain in left arm: Secondary | ICD-10-CM | POA: Diagnosis not present

## 2019-11-22 DIAGNOSIS — Z20822 Contact with and (suspected) exposure to covid-19: Secondary | ICD-10-CM | POA: Diagnosis not present

## 2019-11-22 DIAGNOSIS — R488 Other symbolic dysfunctions: Secondary | ICD-10-CM | POA: Diagnosis not present

## 2019-11-22 DIAGNOSIS — U071 COVID-19: Secondary | ICD-10-CM | POA: Diagnosis not present

## 2019-11-22 DIAGNOSIS — R296 Repeated falls: Secondary | ICD-10-CM | POA: Diagnosis not present

## 2019-11-22 DIAGNOSIS — R410 Disorientation, unspecified: Secondary | ICD-10-CM | POA: Diagnosis not present

## 2019-11-22 DIAGNOSIS — Z9181 History of falling: Secondary | ICD-10-CM | POA: Diagnosis not present

## 2019-11-22 NOTE — TOC Progression Note (Addendum)
Transition of Care [facility]) - Progression Note    Patient Details  Name: KATENA PETITJEAN MRN: [id number] Date of Birth: Oct 31, 1929  Transition of Care [facility]) CM/SW [place], [place] Phone Number: [phone] 11/22/2019, 11:54 AM  Clinical Narrative:     Pt has received insurance authorization #[id number], REF# [id number], Level 2 from 6/7 - 11/24/2019; contact Darnelle Bos re-auth fax# [phone].  CSW contacted Linus Orn at [facility] [phone], she stated she would call this CSW back with room and report information.  CSW contacted patient's daughter [name] [phone] and son/POA Cattie Tineo [phone] w/ status update.   Expected Discharge Plan: [place] Barriers to Discharge: Continued Medical Work up, ED SNF auth  Expected Discharge Plan and Services Expected Discharge Plan: [place] Choice: [place] Living arrangements for the past 2 months: Assisted Living Facility([name] (memory care unit))                                       Social Determinants of Health (SDOH) Interventions    Readmission Risk Interventions No flowsheet data found.

## 2019-11-22 NOTE — ED Notes (Signed)
Pt comfortable and resting at this time.

## 2019-11-22 NOTE — ED Notes (Addendum)
Pt requested bed pan stating had to have BM. Took patient off after a few minutes she did not have a BM.AS

## 2019-11-22 NOTE — ED Notes (Signed)
ED tech repositioned patient, changed her, after urinating. Pt ate half a [place] sandwich. ED tech with pt sitting with her.

## 2019-11-22 NOTE — TOC Transition Note (Signed)
Transition of Care [facility]) - CM/SW Discharge Note   Patient Details  Name: CHIVONNE RASCON MRN: [id number] Date of Birth: Jul 27, 1929  Transition of Care [facility]) CM/SW Contact:  Ova Freshwater Phone Number: [phone] 11/22/2019, 12:41 PM   Clinical Narrative:     Patient will d/c to [facility] [place] [phone], [place], [place] [phone], please include AVS in hub, and hard scripts for narcotic meds.  EDP/ED Staff notified.  Patient's daughter [name] and son/POA Traci Gafford have been notified.   Final next level of care: [place] Barriers to Discharge: Continued Medical Work up, ED SNF auth   Patient Goals and CMS Choice Patient states their goals for this hospitalization and ongoing recovery are:: Discharge to SNF for rehab CMS Medicare.gov Compare Post Acute Care list provided to:: Patient Represenative (must comment)(Dale Suares (son) who is POA) Choice offered to / list presented to : [facility] POA / Guardian(Dale Mapel (son))  Discharge Placement                       Discharge Plan and Services     Post Acute Care Choice: [place]                               Social Determinants of Health (SDOH) Interventions     Readmission Risk Interventions No flowsheet data found.

## 2019-11-22 NOTE — ED Notes (Signed)
Placed pt on bed pan at shift change. Pt was cleaned up, with a clean depend placed on patient.

## 2019-11-22 NOTE — ED Notes (Signed)
Breakfast tray was given. Patient is eating her breakfast and watching TV.

## 2019-11-22 NOTE — ED Notes (Signed)
Pt awake very agitated. Continues to ask to get out of the bed and sit on the couch in the living room. She is very confused does not realize she is in a bed and continues to ask if there is a bed for her. Pt also assumes other people are in the room.AS

## 2019-11-24 DIAGNOSIS — G8929 Other chronic pain: Secondary | ICD-10-CM | POA: Diagnosis not present

## 2019-11-24 DIAGNOSIS — M25551 Pain in right hip: Secondary | ICD-10-CM | POA: Diagnosis not present

## 2019-11-24 DIAGNOSIS — M25552 Pain in left hip: Secondary | ICD-10-CM | POA: Diagnosis not present

## 2019-11-25 DIAGNOSIS — M79604 Pain in right leg: Secondary | ICD-10-CM | POA: Diagnosis not present

## 2019-11-25 DIAGNOSIS — R7989 Other specified abnormal findings of blood chemistry: Secondary | ICD-10-CM | POA: Diagnosis not present

## 2019-11-25 DIAGNOSIS — S52502D Unspecified fracture of the lower end of left radius, subsequent encounter for closed fracture with routine healing: Secondary | ICD-10-CM | POA: Diagnosis not present

## 2019-11-25 DIAGNOSIS — R296 Repeated falls: Secondary | ICD-10-CM | POA: Diagnosis not present

## 2019-11-26 DIAGNOSIS — M79604 Pain in right leg: Secondary | ICD-10-CM | POA: Diagnosis not present

## 2019-11-26 DIAGNOSIS — M25551 Pain in right hip: Secondary | ICD-10-CM | POA: Diagnosis not present

## 2019-11-26 DIAGNOSIS — N189 Chronic kidney disease, unspecified: Secondary | ICD-10-CM | POA: Diagnosis not present

## 2019-11-26 DIAGNOSIS — M25552 Pain in left hip: Secondary | ICD-10-CM | POA: Diagnosis not present

## 2019-11-29 DIAGNOSIS — M25552 Pain in left hip: Secondary | ICD-10-CM | POA: Diagnosis not present

## 2019-11-29 DIAGNOSIS — R296 Repeated falls: Secondary | ICD-10-CM | POA: Diagnosis not present

## 2019-11-29 DIAGNOSIS — M25551 Pain in right hip: Secondary | ICD-10-CM | POA: Diagnosis not present

## 2019-11-29 DIAGNOSIS — L89152 Pressure ulcer of sacral region, stage 2: Secondary | ICD-10-CM | POA: Diagnosis not present

## 2019-12-01 ENCOUNTER — Encounter: Payer: Self-pay | Admitting: *Deleted

## 2019-12-01 ENCOUNTER — Emergency Department: Payer: Medicare Other

## 2019-12-01 ENCOUNTER — Other Ambulatory Visit: Payer: Self-pay

## 2019-12-01 ENCOUNTER — Emergency Department
Admission: EM | Admit: 2019-12-01 | Discharge: 2019-12-02 | Disposition: A | Discharge status: Home / Self Care | Payer: Medicare Other | Attending: Emergency Medicine | Admitting: Emergency Medicine

## 2019-12-01 DIAGNOSIS — R7989 Other specified abnormal findings of blood chemistry: Secondary | ICD-10-CM | POA: Diagnosis not present

## 2019-12-01 DIAGNOSIS — F0390 Unspecified dementia without behavioral disturbance: Secondary | ICD-10-CM | POA: Insufficient documentation

## 2019-12-01 DIAGNOSIS — W1830XA Fall on same level, unspecified, initial encounter: Secondary | ICD-10-CM | POA: Insufficient documentation

## 2019-12-01 DIAGNOSIS — S3993XA Unspecified injury of pelvis, initial encounter: Secondary | ICD-10-CM | POA: Diagnosis not present

## 2019-12-01 DIAGNOSIS — M79642 Pain in left hand: Secondary | ICD-10-CM | POA: Diagnosis not present

## 2019-12-01 DIAGNOSIS — E119 Type 2 diabetes mellitus without complications: Secondary | ICD-10-CM | POA: Insufficient documentation

## 2019-12-01 DIAGNOSIS — N189 Chronic kidney disease, unspecified: Secondary | ICD-10-CM | POA: Diagnosis not present

## 2019-12-01 DIAGNOSIS — R519 Headache, unspecified: Secondary | ICD-10-CM | POA: Diagnosis not present

## 2019-12-01 DIAGNOSIS — R296 Repeated falls: Secondary | ICD-10-CM | POA: Insufficient documentation

## 2019-12-01 DIAGNOSIS — M25552 Pain in left hip: Secondary | ICD-10-CM | POA: Insufficient documentation

## 2019-12-01 DIAGNOSIS — W19XXXA Unspecified fall, initial encounter: Secondary | ICD-10-CM

## 2019-12-01 DIAGNOSIS — S6992XA Unspecified injury of left wrist, hand and finger(s), initial encounter: Secondary | ICD-10-CM | POA: Diagnosis not present

## 2019-12-01 DIAGNOSIS — I10 Essential (primary) hypertension: Secondary | ICD-10-CM | POA: Insufficient documentation

## 2019-12-01 DIAGNOSIS — M79604 Pain in right leg: Secondary | ICD-10-CM | POA: Diagnosis not present

## 2019-12-01 DIAGNOSIS — S199XXA Unspecified injury of neck, initial encounter: Secondary | ICD-10-CM | POA: Diagnosis not present

## 2019-12-01 DIAGNOSIS — M79641 Pain in right hand: Secondary | ICD-10-CM | POA: Diagnosis not present

## 2019-12-01 DIAGNOSIS — Z79899 Other long term (current) drug therapy: Secondary | ICD-10-CM | POA: Diagnosis not present

## 2019-12-01 DIAGNOSIS — S79911A Unspecified injury of right hip, initial encounter: Secondary | ICD-10-CM | POA: Diagnosis not present

## 2019-12-01 LAB — COMPREHENSIVE METABOLIC PANEL
ALT: 12 U/L (ref 0–44)
AST: 18 U/L (ref 15–41)
Albumin: 3.6 g/dL (ref 3.5–5.0)
Alkaline Phosphatase: 114 U/L (ref 38–126)
Anion gap: 11 (ref 5–15)
BUN: 62 mg/dL — ABNORMAL HIGH (ref 8–23)
CO2: 16 mmol/L — ABNORMAL LOW (ref 22–32)
Calcium: 9.8 mg/dL (ref 8.9–10.3)
Chloride: 110 mmol/L (ref 98–111)
Creatinine, Ser: 2.12 mg/dL — ABNORMAL HIGH (ref 0.44–1.00)
GFR calc Af Amer: 24 mL/min — ABNORMAL LOW (ref >60)
GFR calc non Af Amer: 20 mL/min — ABNORMAL LOW (ref >60)
Glucose, Bld: 187 mg/dL — ABNORMAL HIGH (ref 70–99)
Potassium: 3.9 mmol/L (ref 3.5–5.1)
Sodium: 137 mmol/L (ref 135–145)
Total Bilirubin: 0.6 mg/dL (ref 0.3–1.2)
Total Protein: 7 g/dL (ref 6.5–8.1)

## 2019-12-01 LAB — CBC WITH DIFFERENTIAL/PLATELET
Abs Immature Granulocytes: 0.07 10*3/uL (ref 0.00–0.07)
Basophils Absolute: 0 10*3/uL (ref 0.0–0.1)
Basophils Relative: 0 %
Eosinophils Absolute: 0.9 10*3/uL — ABNORMAL HIGH (ref 0.0–0.5)
Eosinophils Relative: 8 %
HCT: 36.6 % (ref 36.0–46.0)
Hemoglobin: 12.5 g/dL (ref 12.0–15.0)
Immature Granulocytes: 1 %
Lymphocytes Relative: 20 %
Lymphs Abs: 2.2 10*3/uL (ref 0.7–4.0)
MCH: 30.3 pg (ref 26.0–34.0)
MCHC: 34.2 g/dL (ref 30.0–36.0)
MCV: 88.8 fL (ref 80.0–100.0)
Monocytes Absolute: 0.9 10*3/uL (ref 0.1–1.0)
Monocytes Relative: 8 %
Neutro Abs: 7 10*3/uL (ref 1.7–7.7)
Neutrophils Relative %: 63 %
Platelets: 339 10*3/uL (ref 150–400)
RBC: 4.12 MIL/uL (ref 3.87–5.11)
RDW: 12.9 % (ref 11.5–15.5)
WBC: 11.2 10*3/uL — ABNORMAL HIGH (ref 4.0–10.5)
nRBC: 0 % (ref 0.0–0.2)

## 2019-12-01 LAB — URINALYSIS, COMPLETE (UACMP) WITH MICROSCOPIC
Bacteria, UA: NONE SEEN
Bilirubin Urine: NEGATIVE
Glucose, UA: NEGATIVE mg/dL
Hgb urine dipstick: NEGATIVE
Ketones, ur: NEGATIVE mg/dL
Nitrite: POSITIVE — AB
Protein, ur: 100 mg/dL — AB
Specific Gravity, Urine: 1.013 (ref 1.005–1.030)
pH: 5 (ref 5.0–8.0)

## 2019-12-01 MED ORDER — SODIUM CHLORIDE 0.9 % IV BOLUS
500.0000 mL | Freq: Once | INTRAVENOUS | Status: AC
  Administered 2019-12-01: 500 mL via INTRAVENOUS

## 2019-12-01 MED ORDER — OXYCODONE HCL 5 MG PO TABS: 2.5000 mg | ORAL_TABLET | Freq: Four times a day (QID) | ORAL | 0 refills | Status: AC | PRN

## 2019-12-01 MED ORDER — ACETAMINOPHEN 500 MG PO TABS
1000.0000 mg | ORAL_TABLET | Freq: Once | ORAL | Status: AC
  Administered 2019-12-01: 1000 mg via ORAL
  Filled 2019-12-01: qty 2

## 2019-12-01 MED ORDER — OXYCODONE HCL 5 MG PO TABS
5.0000 mg | ORAL_TABLET | Freq: Once | ORAL | Status: AC
  Administered 2019-12-01: 5 mg via ORAL
  Filled 2019-12-01: qty 1

## 2019-12-01 NOTE — ED Triage Notes (Signed)
Pt to ED from [facility] after an unwitnessed fall today. Pain reported in left arm and right leg but when asked to explain the pain pt reports "it feels like it is too tight in there." Per EMS pt has been more confused than normal over the past couple days but unknown baseline at this time.   Pt confirms she fell today but denies dizziness or LOC. Bruising noted to face but pt reports this was due to a fall 1 week ago. No blood thinners. Pt also recently was treated for a UTI 10 days ago.   Pt arrived to ED asking staff to call mother or father and states, "they were home when I left."

## 2019-12-01 NOTE — Discharge Instructions (Addendum)
Ct and xray negative for acute.  We discussed doing MRI to evaluate for occult fractures but at this time daughter elects to want to try to keep patient comfortable and avoid surgeries.  We are going to give some oxycodone the understanding that this could increase the risk of falls or confusion but daughter's main goal is to keep patient comfortable.  They are interested in talking to the primary care doctor about potential palliative versus hospice care

## 2019-12-01 NOTE — ED Notes (Signed)
MD at bedside with patient and patient's daughter

## 2019-12-01 NOTE — ED Notes (Addendum)
RN called Carolyn Hernandez for an update on pts status over the past couple days. Update stated that pt has been able to bear weight with PT during daily Physical Therapy but has not been ambulating and does not ambulate or stand outside of Pt.

## 2019-12-01 NOTE — ED Provider Notes (Signed)
[facility] Emergency Department Provider Note  ____________________________________________   First MD Initiated Contact with Patient 12/01/19 1920     (approximate)  I have reviewed the triage vital signs and the nursing notes.   HISTORY  Chief Complaint Altered Mental Status and Fall    HPI Carolyn Hernandez is a 86 y.o. female coming from [place] after unwitnessed fall today.  Patient reports pain in her left hand and her right hip.  Per EMS the facility that patient seemed to be more confused over the past few days.  Unclear her exact baseline.  Patient denies losing consciousness but she does have some bruising on her face that she reports was from a fall 1 week ago.  Denies any blood thinners.  Recently treated for UTI.  Patient is requesting we call her mother and father.  Patient states that she walks on her own  However I reviewed records and patient was last seen 6/2 for worsening pain.  Patient has CT of her hip time a few days prior that was negative.  Patient was sent to a rehab facility given patient was unable to ambulate and she required additional care.    Level 5 caveat.  Unable to get full review of systems due to patient's confusion          Past Medical History:  Diagnosis Date  . Arthritis   . Depression   . Fall   . Hypertension     Patient Active Problem List   Diagnosis Date Noted  . Pressure injury of skin 11/12/2019  . Dementia ([place]) 12/29/2017  . ARF (acute renal failure) ([place]) 12/23/2017  . Macular degeneration 10/06/2017  . Moderate single current episode of major depressive disorder ([place]) 10/06/2017  . Murmur, heart 09/02/2016  . Pedal edema 11/20/2015  . Dilated cardiomyopathy ([place]) 08/28/2015  . Primary osteoarthritis of both knees 01/27/2015  . Diabetes mellitus type II, controlled, with no complications ([place]) 74/01/1447  . Low TSH level 01/02/2015  . HTN (hypertension) 04/04/2014    Past Surgical  History:  Procedure Laterality Date  . ABDOMINAL HYSTERECTOMY    . APPENDECTOMY    . TOTAL HIP ARTHROPLASTY      Prior to Admission medications   Medication Sig Start Date End Date Taking? Authorizing Provider  amLODipine (NORVASC) 10 MG tablet TAKE ONE TABLET EVERY DAY Patient not taking: Reported on 11/18/2019 09/29/19   Verl Bangs, FNP  citalopram (CELEXA) 20 MG tablet Take 1 tablet (20 mg total) by mouth daily. Patient not taking: Reported on 11/18/2019 08/10/18   Mikey College, NP  diclofenac sodium (VOLTAREN) 1 % GEL Apply 2 g topically 4 (four) times daily. 08/10/18   Mikey College, NP  donepezil (ARICEPT) 5 MG tablet TAKE 1 TABLET AT BEDTIME Patient not taking: Reported on 11/18/2019 05/31/19   Olin Hauser, DO  hydrochlorothiazide (HYDRODIURIL) 12.5 MG tablet TAKE 1 TABLET BY MOUTH DAILY Patient not taking: Reported on 11/18/2019 07/19/19   Olin Hauser, DO  HYDROcodone-acetaminophen (NORCO/VICODIN) 5-325 MG tablet Take 1 tablet by mouth every 6 (six) hours as needed for moderate pain. 11/12/19   Nena Polio, MD  hydrOXYzine (VISTARIL) 25 MG capsule Take 25 mg by mouth 2 (two) times daily. 11/05/19   [provider]  lisinopril (ZESTRIL) 40 MG tablet TAKE 1 TABLET BY MOUTH DAILY Patient not taking: Reported on 11/18/2019 09/28/19   Verl Bangs, FNP  metoprolol tartrate (LOPRESSOR) 50 MG tablet Take  1 tablet (50 mg total) by mouth 2 (two) times daily. 08/10/18   Mikey College, NP    Allergies Patient has no known allergies.  Family History  Problem Relation Age of Onset  . Liver disease Son   . Alcohol abuse Son   . Diabetes Daughter   . Heart disease Son     Social History Social History   Tobacco Use  . Smoking status: Never Smoker  . Smokeless tobacco: Never Used  Vaping Use  . Vaping Use: Never used  Substance Use Topics  . Alcohol use: No  . Drug use: No      Review of Systems Unable to get full review  of systems due to patient's confusion.  ________________________   PHYSICAL EXAM:  VITAL SIGNS: Blood pressure (!) 160/58, pulse 77, temperature 97.8 F (36.6 C), temperature source Oral, resp. rate 16, height 5\' 1"  (1.549 m), weight 80.8 kg, SpO2 98 %.  Constitutional: Patient confused requesting that we call her parents. Eyes: Conjunctivae are normal. EOMI. old bruising noted below the bilateral eyes but no tenderness Head: Atraumatic.   Nose: No congestion/rhinnorhea. Mouth/Throat: Mucous membranes are moist.   Neck: No stridor. Trachea Midline. FROM Cardiovascular: Normal rate, regular rhythm. Grossly normal heart sounds.  Good peripheral circulation. No chest wall tenderness Respiratory: Normal respiratory effort.  No retractions. Lungs CTAB. Gastrointestinal: Soft and nontender. No distention. No abdominal bruits.  Musculoskeletal:   RUE: No point tenderness.  Radial pulse intact. Neuro intact. Full ROM in joint. LUE: Tenderness in the left hand., deformity or other signs of injury. Radial pulse intact. Neuro intact. Full ROM in joints RLE: No point tenderness, deformity or other signs of injury. DP pulse intact. Neuro intact. Full ROM in joints. LLE: Maybe some tenderness in the right hip but difficult to really appreciate.  Patient states that just it is tight.. DP pulse intact. Neuro intact.  I am able to lift her leg up without any tenderness. Neurologic:  Normal speech and language. No gross focal neurologic deficits are appreciated.  Skin:  Skin is warm, dry and intact.  Psychiatric: Patient is confused GU: Deferred   ____________________________________________   LABS (all labs ordered are listed, but only abnormal results are displayed)  Labs Reviewed  CBC WITH DIFFERENTIAL/PLATELET - Abnormal; Notable for the following components:      Result Value   WBC 11.2 (*)    Eosinophils Absolute 0.9 (*)    All other components within normal limits  COMPREHENSIVE METABOLIC  PANEL - Abnormal; Notable for the following components:   CO2 16 (*)    Glucose, Bld 187 (*)    BUN 62 (*)    Creatinine, Ser 2.12 (*)    GFR calc non Af Amer 20 (*)    GFR calc Af Amer 24 (*)    All other components within normal limits  URINALYSIS, COMPLETE (UACMP) WITH MICROSCOPIC - Abnormal; Notable for the following components:   Color, Urine YELLOW (*)    APPearance HAZY (*)    Protein, ur 100 (*)    Nitrite POSITIVE (*)    Leukocytes,Ua SMALL (*)    All other components within normal limits  URINE CULTURE   ____________________________________________   ED ECG REPORT I, Vanessa [place], the attending physician, personally viewed and interpreted this ECG.  Normal sinus rate of 68, right bundle branch block, no ST elevation, no T wave inversions.  Patient's had prior right bundle branch block ____________________________________________  RADIOLOGY   Official radiology  report(s): DG Wrist Complete Left  Result Date: 12/01/2019 CLINICAL DATA:  Fall EXAM: LEFT WRIST - COMPLETE 3+ VIEW COMPARISON:  Concurrent hand radiographs, wrist radiographs 11/12/2019 FINDINGS: Subtle band of sclerosis extending across the radial metaphysis compatible with a minimally displaced impacted fracture though this finding was present on comparison radiographs from 11/12/2019. No other definite acute osseous injury is identified though evaluation may be limited by diffuse bony demineralization and advanced arthrosis. Comminuted fragments adjacent the tip of the ulnar styloid likely reflect a combination of ossicle and remote ulnar styloid process fracture. Mild positive ulnar variance. Calcifications seen in the triangular fibrocartilage. Advanced degenerative changes noted throughout the carpal bones and at the radiocarpal articulation with more severe features at the first carpometacarpal and triscaphe joints. Diffuse soft tissue swelling of the wrist. No soft tissue gas or foreign body. Vascular calcium  present in the soft tissues. IMPRESSION: 1. Subtle band of sclerosis extending across the radial metaphysis similar to comparison and may reflect a an incompletely healed injury or re-injury of the distal radial metaphysis. 2. No other definite acute osseous injury is identified though evaluation may be limited by diffuse bony demineralization and advanced arthrosis. 3. Additional degenerative changes in the wrist as above. Electronically Signed   By: Lovena Le M.D.   On: 12/01/2019 20:06   CT Head Wo Contrast  Result Date: 12/01/2019 CLINICAL DATA:  Head trauma, fall, facial bruising EXAM: CT HEAD WITHOUT CONTRAST TECHNIQUE: Contiguous axial images were obtained from the base of the skull through the vertex without intravenous contrast. COMPARISON:  CT 11/12/2019 FINDINGS: Brain: No evidence of acute infarction, hemorrhage, hydrocephalus, extra-axial collection or mass lesion/mass effect. Symmetric prominence of the ventricles, cisterns and sulci compatible with parenchymal volume loss. Patchy areas of white matter hypoattenuation are most compatible with chronic microvascular angiopathy. Vascular: Atherosclerotic calcification of the carotid siphons and intradural vertebral arteries. No hyperdense vessel. Skull: Minimal residual thickening of the frontal scalp, likely related to prior injury no new scalp swelling or hematoma. No calvarial fracture or suspicious osseous lesion. Mild hyperostosis frontalis interna, unchanged from prior. Sinuses/Orbits: Minimal thickening in the ethmoids. Paranasal sinuses and mastoids are otherwise predominantly clear. Prior left lens extraction. Orbital structures as included are otherwise unremarkable. Other: Edentulous. IMPRESSION: 1. No acute intracranial findings. 2. Chronic microvascular angiopathy and parenchymal volume loss. 3. Minimal residual thickening of the right frontal scalp, likely related to prior injury. No new scalp swelling or hematoma. Electronically  Signed   By: Lovena Le M.D.   On: 12/01/2019 20:13   CT Cervical Spine Wo Contrast  Result Date: 12/01/2019 CLINICAL DATA:  Fall, facial bruising EXAM: CT CERVICAL SPINE WITHOUT CONTRAST TECHNIQUE: Multidetector CT imaging of the cervical spine was performed without intravenous contrast. Multiplanar CT image reconstructions were also generated. COMPARISON:  CT 11/04/2019 FINDINGS: Alignment: Stabilization collar absent at the time of examination. Preservation of normal cervical lordosis. Mild anterolisthesis C3 on 4 is similar to the comparison CT and likely on a degenerative basis. No evidence of traumatic listhesis. No abnormally widened, perched or jumped facets. Normal alignment of the craniocervical and atlantoaxial articulations accounting for mild leftward cranial rotation. Skull base and vertebrae: No acute fracture. No suspicious osseous lesions. Advanced arthrosis at the atlantodental interval as well as an articulation between the tip of the dens and basion. Calcific pannus formation posterior to the dens. Bony fusion across C5-C7 levels, similar to comparison. Enthesopathic mineralization noted superficial to the spinous processes along the nuchal ligament. Soft tissues and spinal  canal: No pre or paravertebral fluid or swelling. No visible canal hematoma. Disc levels: Multilevel intervertebral disc height loss with spondylitic endplate changes. Posterior disc focal osteophyte complexes are again seen throughout the cervical spine most pronounced C4-5, C5-6 resulting in at least moderate canal stenosis. Additional ligamentum flavum hypertrophic changes are present in the upper cervical levels contributing some mild stenosis at the remaining cervical levels. Diffuse uncinate spurring and facet hypertrophic changes are present throughout the cervical spine. Resulting mild to moderate multilevel neural foraminal narrowing with more focally severe foraminal narrowing at C3-4, C4-5. Upper chest: No  acute abnormality in the upper chest or imaged lung apices. Other: Cervical carotid atherosclerosis. Redemonstration of the heterogeneous multinodular thyroid gland. IMPRESSION: 1. No acute fracture or traumatic listhesis of the cervical spine. 2. Bony fusion of the C5-C7 levels. 3. Advanced multilevel degenerative changes with maximal changes resulting in moderate canal stenosis at C4-5 and C5-6 and severe bilateral foraminal narrowing C3-4, C4-5. Additional changes as above. 4. Calcific pannus formation posterior to the dens, can be seen as sequela of rheumatoid or CPPD arthropathy. 5. Cervical carotid atherosclerosis. 6. Redemonstration of the heterogeneous multinodular thyroid gland. The decision for further evaluation should be based upon clinical assessment of the patient's age and comorbidities as well as desire to pursue further workup. This follows consensus guidelines: Managing Incidental Thyroid Nodules Detected on Imaging: White Paper of the ACR Incidental Thyroid Findings Committee. J Am Coll Radiol 2015; 12:143-150. and [facility] 3-tiered system for managing ITNs: J Am Coll Radiol. 2015; Feb;12(2): 143-50 Electronically Signed   By: Lovena Le M.D.   On: 12/01/2019 20:19   CT PELVIS WO CONTRAST  Result Date: 12/01/2019 CLINICAL DATA:  Fall 1 week ago and again today. EXAM: CT PELVIS WITHOUT CONTRAST TECHNIQUE: Multidetector CT imaging of the pelvis was performed following the standard protocol without intravenous contrast. COMPARISON:  Plain films today FINDINGS: Urinary Tract:  No abnormality visualized. Bowel:  Unremarkable visualized pelvic bowel loops. Vascular/Lymphatic: Aortoiliac atherosclerosis. No aneurysm or adenopathy. Reproductive:  Prior hysterectomy.  No adnexal masses. Other:  No free fluid or free air. Musculoskeletal: Remote posttraumatic and postsurgical changes in the proximal left femur. Advanced osteoarthritis changes in the hips bilaterally, right greater than left with joint  space narrowing, spurring and subchondral cyst formation. No acute fracture, subluxation or dislocation. IMPRESSION: No acute bony abnormality. Advanced osteoarthritis in the hips bilaterally. Aortoiliac atherosclerosis. Electronically Signed   By: Rolm Baptise M.D.   On: 12/01/2019 20:16   DG Hand 2 View Left  Result Date: 12/01/2019 CLINICAL DATA:  Fall, unwitnessed fall EXAM: LEFT HAND - 2 VIEW COMPARISON:  Concurrent wrist radiographs, hand wrist radiographs 11/12/2019 FINDINGS: The osseous structures appear diffusely demineralized which may limit detection of small or nondisplaced fractures. Band of sclerosis across the distal radial metaphysis could reflect incompletely healed fracture seen on comparison exam or re-injury. Finding is better detailed on concurrent wrist radiographs. No other acute osseous abnormality is seen in the hand. There is advanced degenerative arthrosis throughout the hand and wrist most prominently at the first carpometacarpal and triscaphe joints. Mild interphalangeal and metacarpophalangeal arthrosis is present as well. No worrisome osseous lesions. Pulse oximeter upon the tip of the second digit. IMPRESSION: 1. Diffuse osseous demineralization which may limit detection of small or nondisplaced fractures. 2. Band of sclerosis across the distal radial metaphysis could reflect incompletely healed fracture seen on comparison exam or re-injury. Finding is better detailed on concurrent wrist radiographs. 3. No other acute osseous injury  in the hand. 4. Extensive degenerative changes about the hand and wrist. Electronically Signed   By: Lovena Le M.D.   On: 12/01/2019 20:10   DG Hip Unilat W or Wo Pelvis 2-3 Views Right  Result Date: 12/01/2019 CLINICAL DATA:  Un witnessed fall today, right leg pain EXAM: DG HIP (WITH OR WITHOUT PELVIS) 2-3V RIGHT COMPARISON:  08/10/2006 FINDINGS: Frontal view of the pelvis as well as frontal and frogleg lateral views of the right hip are  obtained. Postsurgical changes are seen within the proximal left femur from prior ORIF of a femoral neck fracture. Extensive heterotopic ossification surrounds the left hip. There is end-stage right hip osteoarthritis with axial joint space narrowing and circumferential osteophyte formation. Numerous subchondral cysts are noted. No acute displaced fracture. The remainder of the pelvis is unremarkable. Sacroiliac joints are normal. IMPRESSION: 1. No acute displaced fracture. 2. Severe right hip osteoarthritis. 3. Postsurgical changes left hip. Electronically Signed   By: Randa Ngo M.D.   On: 12/01/2019 20:03    ____________________________________________   PROCEDURES  Procedure(s) performed (including Critical Care):  Procedures   ____________________________________________   INITIAL IMPRESSION / ASSESSMENT AND PLAN / ED COURSE   DERIONNA SALVADOR was evaluated in Emergency Department on 12/01/2019 for the symptoms described in the history of present illness. She was evaluated in the context of the global COVID-19 pandemic, which necessitated consideration that the patient might be at risk for infection with the SARS-CoV-2 virus that causes COVID-19. Institutional protocols and algorithms that pertain to the evaluation of patients at risk for COVID-19 are in a state of rapid change based on information released by regulatory bodies including the CDC and federal and state organizations. These policies and algorithms were followed during the patient's care in the ED.    Patient is a 86 year old who presents with fall.  Will get CT head evaluate for intracranial hemorrhage, CT cervical evaluate for cervical fracture.  Will get x-rays of the left hand and the right hip to evaluate for fractures.  Patient states that she has a little bit of tenderness in her right hip but on review of her records it seems that that is what she was seen for a few days ago when she was sent to the rehab  facility.  Patient states that she can walk but according to the notes she is nonambulatory.  Will discuss with the facility to get a better baseline given patient clearly has some dementia and confusion.  White count slightly elevated but downtrending from previous Kidney function slightly elevated 2.12 given 500 cc of fluid  CT head negative for acute process CT cervical negative for acute process. CT pelvis negative  X-ray shows subtle band of sclerosis extending across the radial metaphysis similar to comparison.  Attempted to get patient out of bed and she was unable to get out of bed.  There is concern for possible right hip pain but is difficult to tell because she seems to be having pain all over.  Patient's daughter is at bedside who is the POA.  She is reporting progressively worsening dementia and it sounds at the facility that she pretty much needs full assist and is hardly taking any steps at all.  We discussed the possibility of MRI to rule out occult hip fracture but family does not want to pursue further work-up.  They stated that at this point they would not be an operative candidate and they would like to turn to more palliative and comfort  measures.  They are requesting something additional for pain.  I discussed that we could do some oxycodone but there is a risk for increasing falls and increasing confusion and they understand that that risk but would stated that they would just really prefer her to be comfortable during this time.  Given this we will give a dose of oxycodone here and a few pills of oxycodone for facility to they can talk to their doctor.  They are interested in maybe discussing hospice if she can meet criteria for that.  Her urine was nitrite positive but she has no WBCs and she just recently had a course of Keflex and her urine culture did not grow anything at that time.  I think would be best to resend another urine culture given she is afebrile, white count is  trending down and denies any symptoms of UTI.  I do not want to risk giving her C. Difficile when she otherwise does not seem to be having a true infection.  We will just discharge patient back to her rehab facility  I discussed the provisional nature of ED diagnosis, the treatment so far, the ongoing plan of care, follow up appointments and return precautions with the patient and any family or support people present. They expressed understanding and agreed with the plan, discharged home.       ____________________________________________   FINAL CLINICAL IMPRESSION(S) / ED DIAGNOSES   Final diagnoses:  Fall, initial encounter      MEDICATIONS GIVEN DURING THIS VISIT:  Medications  sodium chloride 0.9 % bolus 500 mL (0 mLs Intravenous Stopped 12/01/19 2227)  acetaminophen (TYLENOL) tablet 1,000 mg (1,000 mg Oral Given 12/01/19 2226)  oxyCODONE (Oxy IR/ROXICODONE) immediate release tablet 5 mg (5 mg Oral Given 12/01/19 2324)     ED Discharge Orders         Ordered    oxyCODONE (ROXICODONE) 5 MG immediate release tablet  Every 6 hours PRN     Discontinue  Reprint     12/01/19 2349           Note:  This document was prepared using Dragon voice recognition software and may include unintentional dictation errors.   Vanessa Sobieski, MD 12/01/19 2351

## 2019-12-02 ENCOUNTER — Telehealth: Payer: Self-pay

## 2019-12-02 DIAGNOSIS — R296 Repeated falls: Secondary | ICD-10-CM | POA: Diagnosis not present

## 2019-12-02 DIAGNOSIS — M25551 Pain in right hip: Secondary | ICD-10-CM | POA: Diagnosis not present

## 2019-12-02 DIAGNOSIS — D72829 Elevated white blood cell count, unspecified: Secondary | ICD-10-CM | POA: Diagnosis not present

## 2019-12-02 NOTE — Telephone Encounter (Signed)
-----   Message from Verl Bangs, FNP sent at 12/02/2019  9:27 AM EDT ----- Regarding: Virtual visit with daughter? Patient was in the ER recently and family was discussing possible hospice with the daughter.  Can we contact the daughter for a virtual visit to see if we need to send a referral to [facility] for hospice? Thanks

## 2019-12-02 NOTE — Telephone Encounter (Signed)
I spoke with the patient daughter [name] and informed her that we are here if we can any assistance with helping getting her mother set up for possible Hospice care. She informed me that she will discuss this with her other siblings and follow back up with [place] at a later date.

## 2019-12-02 NOTE — ED Notes (Addendum)
Report called to [facility]: Adrena  Can not obtain discharge signature due to AMS.

## 2019-12-02 NOTE — ED Notes (Signed)
RN checked depends. No BM or urine noted.

## 2019-12-02 NOTE — Telephone Encounter (Signed)
-----   Message from Verl Bangs, FNP sent at 12/02/2019  9:27 AM EDT ----- Regarding: Virtual visit with daughter? Patient was in the ER recently and family was discussing possible hospice with the daughter.  Can we contact the daughter for a virtual visit to see if we need to send a referral to [facility] for hospice? Thanks

## 2019-12-03 DIAGNOSIS — N189 Chronic kidney disease, unspecified: Secondary | ICD-10-CM | POA: Diagnosis not present

## 2019-12-03 DIAGNOSIS — D72829 Elevated white blood cell count, unspecified: Secondary | ICD-10-CM | POA: Diagnosis not present

## 2019-12-03 DIAGNOSIS — N179 Acute kidney failure, unspecified: Secondary | ICD-10-CM | POA: Diagnosis not present

## 2019-12-03 DIAGNOSIS — E878 Other disorders of electrolyte and fluid balance, not elsewhere classified: Secondary | ICD-10-CM | POA: Diagnosis not present

## 2019-12-04 LAB — URINE CULTURE: Culture: >=100000 — AB

## 2019-12-05 NOTE — Consult Note (Signed)
Discussed case with Dr. Charna Archer. Pt grew PSA in Ucx. MD wants to call in prescription for cipro 250 mg BID x 3 days. Called patient and patient's family member no answer - left call back number. Called [facility] to report lab report to relay to PCP, left call back number. Will call in prescription once we get in touch with a family member and see what pharmacy to call in prescription.   Thanks,   Eleonore Chiquito, PharmD, BCPS

## 2019-12-06 DIAGNOSIS — J189 Pneumonia, unspecified organism: Secondary | ICD-10-CM | POA: Diagnosis not present

## 2019-12-06 DIAGNOSIS — N189 Chronic kidney disease, unspecified: Secondary | ICD-10-CM | POA: Diagnosis not present

## 2019-12-06 DIAGNOSIS — D72829 Elevated white blood cell count, unspecified: Secondary | ICD-10-CM | POA: Diagnosis not present

## 2019-12-06 DIAGNOSIS — I517 Cardiomegaly: Secondary | ICD-10-CM | POA: Diagnosis not present

## 2019-12-06 DIAGNOSIS — M79602 Pain in left arm: Secondary | ICD-10-CM | POA: Diagnosis not present

## 2019-12-10 DIAGNOSIS — N189 Chronic kidney disease, unspecified: Secondary | ICD-10-CM | POA: Diagnosis not present

## 2019-12-10 DIAGNOSIS — N179 Acute kidney failure, unspecified: Secondary | ICD-10-CM | POA: Diagnosis not present

## 2019-12-10 DIAGNOSIS — J189 Pneumonia, unspecified organism: Secondary | ICD-10-CM | POA: Diagnosis not present

## 2019-12-10 DIAGNOSIS — N390 Urinary tract infection, site not specified: Secondary | ICD-10-CM | POA: Diagnosis not present

## 2019-12-10 DIAGNOSIS — D72829 Elevated white blood cell count, unspecified: Secondary | ICD-10-CM | POA: Diagnosis not present

## 2019-12-13 DIAGNOSIS — J189 Pneumonia, unspecified organism: Secondary | ICD-10-CM | POA: Diagnosis not present

## 2019-12-13 DIAGNOSIS — L89153 Pressure ulcer of sacral region, stage 3: Secondary | ICD-10-CM | POA: Diagnosis not present

## 2019-12-13 DIAGNOSIS — D72829 Elevated white blood cell count, unspecified: Secondary | ICD-10-CM | POA: Diagnosis not present

## 2019-12-13 DIAGNOSIS — R7981 Abnormal blood-gas level: Secondary | ICD-10-CM | POA: Diagnosis not present

## 2019-12-13 DIAGNOSIS — R7989 Other specified abnormal findings of blood chemistry: Secondary | ICD-10-CM | POA: Diagnosis not present

## 2019-12-13 DIAGNOSIS — N179 Acute kidney failure, unspecified: Secondary | ICD-10-CM | POA: Diagnosis not present

## 2019-12-13 DIAGNOSIS — N189 Chronic kidney disease, unspecified: Secondary | ICD-10-CM | POA: Diagnosis not present

## 2019-12-13 DIAGNOSIS — D649 Anemia, unspecified: Secondary | ICD-10-CM | POA: Diagnosis not present

## 2019-12-13 DIAGNOSIS — E878 Other disorders of electrolyte and fluid balance, not elsewhere classified: Secondary | ICD-10-CM | POA: Diagnosis not present

## 2019-12-13 DIAGNOSIS — R791 Abnormal coagulation profile: Secondary | ICD-10-CM | POA: Diagnosis not present

## 2019-12-14 ENCOUNTER — Other Ambulatory Visit: Payer: Self-pay

## 2019-12-14 ENCOUNTER — Encounter: Payer: Self-pay | Admitting: Emergency Medicine

## 2019-12-14 ENCOUNTER — Emergency Department: Payer: Medicare Other

## 2019-12-14 ENCOUNTER — Inpatient Hospital Stay
Admission: EM | Admit: 2019-12-14 | Discharge: 2019-12-19 | DRG: 948 | Disposition: A | Discharge status: Hospice | Payer: Medicare Other | Source: Skilled Nursing Facility | Attending: Internal Medicine | Admitting: Internal Medicine

## 2019-12-14 DIAGNOSIS — I4510 Unspecified right bundle-branch block: Secondary | ICD-10-CM | POA: Diagnosis not present

## 2019-12-14 DIAGNOSIS — J440 Chronic obstructive pulmonary disease with acute lower respiratory infection: Secondary | ICD-10-CM | POA: Diagnosis not present

## 2019-12-14 DIAGNOSIS — N3090 Cystitis, unspecified without hematuria: Secondary | ICD-10-CM | POA: Diagnosis not present

## 2019-12-14 DIAGNOSIS — I517 Cardiomegaly: Secondary | ICD-10-CM | POA: Diagnosis not present

## 2019-12-14 DIAGNOSIS — Z743 Need for continuous supervision: Secondary | ICD-10-CM | POA: Diagnosis not present

## 2019-12-14 DIAGNOSIS — R52 Pain, unspecified: Secondary | ICD-10-CM | POA: Diagnosis not present

## 2019-12-14 DIAGNOSIS — Z09 Encounter for follow-up examination after completed treatment for conditions other than malignant neoplasm: Secondary | ICD-10-CM | POA: Diagnosis not present

## 2019-12-14 DIAGNOSIS — F0281 Dementia in other diseases classified elsewhere with behavioral disturbance: Secondary | ICD-10-CM | POA: Diagnosis not present

## 2019-12-14 DIAGNOSIS — Z833 Family history of diabetes mellitus: Secondary | ICD-10-CM

## 2019-12-14 DIAGNOSIS — Z96649 Presence of unspecified artificial hip joint: Secondary | ICD-10-CM | POA: Diagnosis not present

## 2019-12-14 DIAGNOSIS — R531 Weakness: Secondary | ICD-10-CM | POA: Diagnosis not present

## 2019-12-14 DIAGNOSIS — S3993XA Unspecified injury of pelvis, initial encounter: Secondary | ICD-10-CM | POA: Diagnosis not present

## 2019-12-14 DIAGNOSIS — Z9119 Patient's noncompliance with other medical treatment and regimen: Secondary | ICD-10-CM | POA: Diagnosis not present

## 2019-12-14 DIAGNOSIS — M1611 Unilateral primary osteoarthritis, right hip: Secondary | ICD-10-CM | POA: Diagnosis not present

## 2019-12-14 DIAGNOSIS — R296 Repeated falls: Secondary | ICD-10-CM

## 2019-12-14 DIAGNOSIS — Z79899 Other long term (current) drug therapy: Secondary | ICD-10-CM | POA: Diagnosis not present

## 2019-12-14 DIAGNOSIS — S0191XA Laceration without foreign body of unspecified part of head, initial encounter: Secondary | ICD-10-CM | POA: Diagnosis not present

## 2019-12-14 DIAGNOSIS — Z515 Encounter for palliative care: Secondary | ICD-10-CM | POA: Diagnosis not present

## 2019-12-14 DIAGNOSIS — R8271 Bacteriuria: Secondary | ICD-10-CM | POA: Diagnosis present

## 2019-12-14 DIAGNOSIS — Z789 Other specified health status: Secondary | ICD-10-CM

## 2019-12-14 DIAGNOSIS — J189 Pneumonia, unspecified organism: Secondary | ICD-10-CM | POA: Diagnosis not present

## 2019-12-14 DIAGNOSIS — S199XXA Unspecified injury of neck, initial encounter: Secondary | ICD-10-CM | POA: Diagnosis not present

## 2019-12-14 DIAGNOSIS — R58 Hemorrhage, not elsewhere classified: Secondary | ICD-10-CM | POA: Diagnosis not present

## 2019-12-14 DIAGNOSIS — E872 Acidosis: Secondary | ICD-10-CM | POA: Diagnosis present

## 2019-12-14 DIAGNOSIS — D649 Anemia, unspecified: Secondary | ICD-10-CM | POA: Diagnosis not present

## 2019-12-14 DIAGNOSIS — I129 Hypertensive chronic kidney disease with stage 1 through stage 4 chronic kidney disease, or unspecified chronic kidney disease: Secondary | ICD-10-CM | POA: Diagnosis not present

## 2019-12-14 DIAGNOSIS — R5381 Other malaise: Principal | ICD-10-CM | POA: Diagnosis present

## 2019-12-14 DIAGNOSIS — Z66 Do not resuscitate: Secondary | ICD-10-CM | POA: Diagnosis not present

## 2019-12-14 DIAGNOSIS — H3530 Unspecified macular degeneration: Secondary | ICD-10-CM | POA: Diagnosis not present

## 2019-12-14 DIAGNOSIS — Z20822 Contact with and (suspected) exposure to covid-19: Secondary | ICD-10-CM | POA: Diagnosis not present

## 2019-12-14 DIAGNOSIS — R410 Disorientation, unspecified: Secondary | ICD-10-CM | POA: Diagnosis not present

## 2019-12-14 DIAGNOSIS — G20 Parkinson's disease: Secondary | ICD-10-CM | POA: Diagnosis not present

## 2019-12-14 DIAGNOSIS — M25512 Pain in left shoulder: Secondary | ICD-10-CM | POA: Diagnosis not present

## 2019-12-14 DIAGNOSIS — N184 Chronic kidney disease, stage 4 (severe): Secondary | ICD-10-CM | POA: Diagnosis not present

## 2019-12-14 DIAGNOSIS — W1830XA Fall on same level, unspecified, initial encounter: Secondary | ICD-10-CM | POA: Diagnosis present

## 2019-12-14 DIAGNOSIS — S0181XA Laceration without foreign body of other part of head, initial encounter: Secondary | ICD-10-CM | POA: Diagnosis not present

## 2019-12-14 DIAGNOSIS — I10 Essential (primary) hypertension: Secondary | ICD-10-CM | POA: Diagnosis not present

## 2019-12-14 DIAGNOSIS — E119 Type 2 diabetes mellitus without complications: Secondary | ICD-10-CM | POA: Diagnosis present

## 2019-12-14 DIAGNOSIS — R32 Unspecified urinary incontinence: Secondary | ICD-10-CM | POA: Diagnosis present

## 2019-12-14 DIAGNOSIS — Z7189 Other specified counseling: Secondary | ICD-10-CM | POA: Diagnosis not present

## 2019-12-14 DIAGNOSIS — S0990XA Unspecified injury of head, initial encounter: Secondary | ICD-10-CM | POA: Diagnosis not present

## 2019-12-14 DIAGNOSIS — S4992XA Unspecified injury of left shoulder and upper arm, initial encounter: Secondary | ICD-10-CM | POA: Diagnosis not present

## 2019-12-14 DIAGNOSIS — B379 Candidiasis, unspecified: Secondary | ICD-10-CM | POA: Diagnosis not present

## 2019-12-14 DIAGNOSIS — B370 Candidal stomatitis: Secondary | ICD-10-CM | POA: Diagnosis not present

## 2019-12-14 DIAGNOSIS — K649 Unspecified hemorrhoids: Secondary | ICD-10-CM | POA: Diagnosis not present

## 2019-12-14 DIAGNOSIS — W19XXXA Unspecified fall, initial encounter: Secondary | ICD-10-CM

## 2019-12-14 DIAGNOSIS — D72829 Elevated white blood cell count, unspecified: Secondary | ICD-10-CM | POA: Diagnosis not present

## 2019-12-14 DIAGNOSIS — F05 Delirium due to known physiological condition: Secondary | ICD-10-CM | POA: Diagnosis present

## 2019-12-14 DIAGNOSIS — N179 Acute kidney failure, unspecified: Secondary | ICD-10-CM | POA: Diagnosis not present

## 2019-12-14 DIAGNOSIS — S79922A Unspecified injury of left thigh, initial encounter: Secondary | ICD-10-CM | POA: Diagnosis not present

## 2019-12-14 DIAGNOSIS — G301 Alzheimer's disease with late onset: Secondary | ICD-10-CM | POA: Diagnosis not present

## 2019-12-14 DIAGNOSIS — Z23 Encounter for immunization: Secondary | ICD-10-CM

## 2019-12-14 DIAGNOSIS — R9082 White matter disease, unspecified: Secondary | ICD-10-CM | POA: Diagnosis not present

## 2019-12-14 DIAGNOSIS — G309 Alzheimer's disease, unspecified: Secondary | ICD-10-CM | POA: Diagnosis not present

## 2019-12-14 DIAGNOSIS — F0390 Unspecified dementia without behavioral disturbance: Secondary | ICD-10-CM | POA: Diagnosis not present

## 2019-12-14 DIAGNOSIS — I480 Paroxysmal atrial fibrillation: Secondary | ICD-10-CM | POA: Diagnosis not present

## 2019-12-14 DIAGNOSIS — E878 Other disorders of electrolyte and fluid balance, not elsewhere classified: Secondary | ICD-10-CM | POA: Diagnosis not present

## 2019-12-14 DIAGNOSIS — J209 Acute bronchitis, unspecified: Secondary | ICD-10-CM | POA: Diagnosis not present

## 2019-12-14 DIAGNOSIS — F419 Anxiety disorder, unspecified: Secondary | ICD-10-CM | POA: Diagnosis present

## 2019-12-14 DIAGNOSIS — I499 Cardiac arrhythmia, unspecified: Secondary | ICD-10-CM | POA: Diagnosis not present

## 2019-12-14 DIAGNOSIS — S8992XA Unspecified injury of left lower leg, initial encounter: Secondary | ICD-10-CM | POA: Diagnosis not present

## 2019-12-14 DIAGNOSIS — J449 Chronic obstructive pulmonary disease, unspecified: Secondary | ICD-10-CM | POA: Diagnosis not present

## 2019-12-14 LAB — COMPREHENSIVE METABOLIC PANEL
ALT: 13 U/L (ref 0–44)
AST: 20 U/L (ref 15–41)
Albumin: 3.2 g/dL — ABNORMAL LOW (ref 3.5–5.0)
Alkaline Phosphatase: 89 U/L (ref 38–126)
Anion gap: 14 (ref 5–15)
BUN: 46 mg/dL — ABNORMAL HIGH (ref 8–23)
CO2: 17 mmol/L — ABNORMAL LOW (ref 22–32)
Calcium: 9.8 mg/dL (ref 8.9–10.3)
Chloride: 109 mmol/L (ref 98–111)
Creatinine, Ser: 1.99 mg/dL — ABNORMAL HIGH (ref 0.44–1.00)
GFR calc Af Amer: 26 mL/min — ABNORMAL LOW (ref >60)
GFR calc non Af Amer: 22 mL/min — ABNORMAL LOW (ref >60)
Glucose, Bld: 111 mg/dL — ABNORMAL HIGH (ref 70–99)
Potassium: 4 mmol/L (ref 3.5–5.1)
Sodium: 140 mmol/L (ref 135–145)
Total Bilirubin: 0.8 mg/dL (ref 0.3–1.2)
Total Protein: 6.7 g/dL (ref 6.5–8.1)

## 2019-12-14 LAB — CBC WITH DIFFERENTIAL/PLATELET
Abs Immature Granulocytes: 0.12 10*3/uL — ABNORMAL HIGH (ref 0.00–0.07)
Basophils Absolute: 0.1 10*3/uL (ref 0.0–0.1)
Basophils Relative: 0 %
Eosinophils Absolute: 0.4 10*3/uL (ref 0.0–0.5)
Eosinophils Relative: 2 %
HCT: 33.1 % — ABNORMAL LOW (ref 36.0–46.0)
Hemoglobin: 11 g/dL — ABNORMAL LOW (ref 12.0–15.0)
Immature Granulocytes: 1 %
Lymphocytes Relative: 12 %
Lymphs Abs: 2.2 10*3/uL (ref 0.7–4.0)
MCH: 30.1 pg (ref 26.0–34.0)
MCHC: 33.2 g/dL (ref 30.0–36.0)
MCV: 90.4 fL (ref 80.0–100.0)
Monocytes Absolute: 1.1 10*3/uL — ABNORMAL HIGH (ref 0.1–1.0)
Monocytes Relative: 6 %
Neutro Abs: 14.8 10*3/uL — ABNORMAL HIGH (ref 1.7–7.7)
Neutrophils Relative %: 79 %
Platelets: 395 10*3/uL (ref 150–400)
RBC: 3.66 MIL/uL — ABNORMAL LOW (ref 3.87–5.11)
RDW: 13.4 % (ref 11.5–15.5)
WBC: 18.6 10*3/uL — ABNORMAL HIGH (ref 4.0–10.5)
nRBC: 0 % (ref 0.0–0.2)

## 2019-12-14 LAB — URINALYSIS, COMPLETE (UACMP) WITH MICROSCOPIC
Bilirubin Urine: NEGATIVE
Glucose, UA: NEGATIVE mg/dL
Ketones, ur: 5 mg/dL — AB
Nitrite: NEGATIVE
Protein, ur: 100 mg/dL — AB
Specific Gravity, Urine: 1.012 (ref 1.005–1.030)
WBC, UA: >50 WBC/hpf — ABNORMAL HIGH (ref 0–5)
pH: 5 (ref 5.0–8.0)

## 2019-12-14 LAB — PROCALCITONIN: Procalcitonin: <0.1 ng/mL

## 2019-12-14 LAB — SARS CORONAVIRUS 2 BY RT PCR (HOSPITAL ORDER, PERFORMED IN ~~LOC~~ HOSPITAL LAB): SARS Coronavirus 2: NEGATIVE

## 2019-12-14 LAB — LACTIC ACID, PLASMA
Lactic Acid, Venous: 1.1 mmol/L (ref 0.5–1.9)
Lactic Acid, Venous: 1.9 mmol/L (ref 0.5–1.9)

## 2019-12-14 MED ORDER — SODIUM CHLORIDE 0.9 % IV BOLUS
250.0000 mL | Freq: Once | INTRAVENOUS | Status: AC
  Administered 2019-12-14: 250 mL via INTRAVENOUS

## 2019-12-14 MED ORDER — HALOPERIDOL LACTATE 5 MG/ML IJ SOLN
2.0000 mg | Freq: Once | INTRAMUSCULAR | Status: AC
  Administered 2019-12-14: 2 mg via INTRAVENOUS
  Filled 2019-12-14: qty 1

## 2019-12-14 MED ORDER — LIDOCAINE-EPINEPHRINE-TETRACAINE (LET) TOPICAL GEL
3.0000 mL | Freq: Once | TOPICAL | Status: AC
  Administered 2019-12-14: 3 mL via TOPICAL
  Filled 2019-12-14: qty 3

## 2019-12-14 MED ORDER — TETANUS-DIPHTH-ACELL PERTUSSIS 5-2.5-18.5 LF-MCG/0.5 IM SUSP
0.5000 mL | Freq: Once | INTRAMUSCULAR | Status: AC
  Administered 2019-12-14: 0.5 mL via INTRAMUSCULAR
  Filled 2019-12-14: qty 0.5

## 2019-12-14 MED ORDER — SODIUM CHLORIDE 0.9 % IV SOLN
2.0000 g | Freq: Once | INTRAVENOUS | Status: DC
  Filled 2019-12-14: qty 2

## 2019-12-14 MED ORDER — LACTATED RINGERS IV SOLN: INTRAVENOUS | Status: AC

## 2019-12-14 MED ORDER — ACETAMINOPHEN 325 MG PO TABS
650.0000 mg | ORAL_TABLET | Freq: Once | ORAL | Status: AC
  Administered 2019-12-14: 650 mg via ORAL
  Filled 2019-12-14: qty 2

## 2019-12-14 MED ORDER — ENSURE ENLIVE PO LIQD
237.0000 mL | Freq: Two times a day (BID) | ORAL | Status: DC
  Administered 2019-12-15 – 2019-12-19 (×7): 237 mL via ORAL

## 2019-12-14 MED ORDER — HYDROCODONE-ACETAMINOPHEN 5-325 MG PO TABS
1.0000 | ORAL_TABLET | Freq: Four times a day (QID) | ORAL | Status: DC | PRN
  Administered 2019-12-14 – 2019-12-19 (×9): 1 via ORAL
  Filled 2019-12-14 (×9): qty 1

## 2019-12-14 MED ORDER — HALOPERIDOL LACTATE 5 MG/ML IJ SOLN
2.0000 mg | Freq: Once | INTRAMUSCULAR | Status: AC
  Administered 2019-12-14: 2 mg via INTRAVENOUS

## 2019-12-14 MED ORDER — AMLODIPINE BESYLATE 10 MG PO TABS
10.0000 mg | ORAL_TABLET | Freq: Every day | ORAL | Status: DC
  Administered 2019-12-15 – 2019-12-16 (×2): 10 mg via ORAL
  Filled 2019-12-14 (×2): qty 1

## 2019-12-14 MED ORDER — HALOPERIDOL LACTATE 5 MG/ML IJ SOLN: 5.0000 mg | Freq: Four times a day (QID) | INTRAMUSCULAR | Status: AC | PRN

## 2019-12-14 MED ORDER — ACETAMINOPHEN 325 MG PO TABS
650.0000 mg | ORAL_TABLET | Freq: Four times a day (QID) | ORAL | Status: DC | PRN
  Administered 2019-12-14: 23:00:00 650 mg via ORAL
  Filled 2019-12-14: qty 2

## 2019-12-14 MED ORDER — METOPROLOL TARTRATE 50 MG PO TABS
50.0000 mg | ORAL_TABLET | Freq: Two times a day (BID) | ORAL | Status: DC
  Administered 2019-12-14 – 2019-12-16 (×5): 50 mg via ORAL
  Filled 2019-12-14 (×5): qty 1

## 2019-12-14 NOTE — ED Provider Notes (Signed)
Procedures  Clinical Course as of Dec 13 1548  Tue Dec 14, 2019  1427 Laceration was well approximated with 2 Steri-Strips.   [PR]  8295 Patient now with mild increase in white count since previous but no other signs of sepsis.  Will obtain urine if if there is signs of persistent cystitis I do believe she is going to require hospitalization for IV antibiotics.  Patient be signed out to oncoming physician pending reassessment.   [PR]    Clinical Course User Index [PR] Merlyn Lot, MD    ----------------------------------------- 3:50 PM on 12/14/2019 -----------------------------------------  Urinalysis just resulted and shows a clear UTI.  With generalized weakness and multiple falls and outpatient treatment failure from recent ciprofloxacin from Pseudomonas UTI, patient will need to be admitted with IV cefepime.  The patient has leukocytosis but normal vital signs, currently not septic.   Carrie Mew, MD 12/14/19 [phone]

## 2019-12-14 NOTE — H&P (Signed)
History and Physical    Carolyn Hernandez ZRA:[id number] DOB: Aug 05, 1929 DOA: 12/14/2019  PCP: Verl Bangs, FNP  Patient coming from: SNF  I have personally briefly reviewed patient's old medical records in [place]  Chief Complaint: falls  HPI: Carolyn Hernandez is a 86 y.o. female with medical history significant of advanced dementia, frequent falls, HTN who presented from SNF after an unwitnessed fall.  Pt has advanced dementia, so HPI obtained from her daughter at bedside.  Pt has had frequent falls, and about 6 weeks ago, pt stopped being able to walk and had moved from assisted living to SNF.  Pt had most likely hit her head during those falls because pt had bruising to her face.  Pt has had multiple ED visits after these falls (3 in the past month), and was dx with UTI during one of those visits.  Pt has had no urinary symptoms, dysuria, abdominal pain or fevers, per daughter.  Daughter didn't know whether pt was having normal PO intake or hydration at the SNF because pt couldn't remember whether she had had a meal or not.  Daughter said pt "sun downs" and becomes more confused and agitated at night.  Pt herself did complain of pain of various places at various times.  According to medical records, pt had a course of Keflex prescribed on 5/28 and Levaquin prescribed on 6/25.  Last urine cx on 6/16 (obtained during ED visit) grew pseudomonas, but abx was not started at that time due to no signs of UTI.   ED Course: initial vitals: afebrile, pulse 98, BP 159/81, sating 100% on room air.  Labs notable for WBC 18.6, Cr 1.99, lactic acid 1.1, UA showed neg nitrite/large leuk/many bacteria, CXR no acute finding, pan skeletal xrays showed no acute fractures.  Pt was admitted for observation.   Assessment/Plan Active Problems:   Fall  # Recurrent falls # Debility  --Pan-scans showed no acute fractures.  Pt is no longer ambulatory at baseline.  Due to dementia, pt  frequently tries to get out of bed and falls. PLAN: --continue home Norco PRN for pain  # Asymptomatic bacteruria  # Urinary incontinence --No urinary symptoms, no fever.  The only sign of infection on presentation was elevated WBC, which could be stress response.  Pt has been treated with Keflex and Levaquin in the past month with no change in her mental status and functional status.  Pt is likely chronically colonized. PLAN: --obtain procal, which returned neg. --Hold abx  # Sun-downing  # Advanced dementia --Pt has sun-downing even at the SNF.  Pt is not oriented, calls her daughter "mama", and repeatedly wanting to get back home to her children. PLAN: --IV haldol 5 mg PRN for agitation --Avoid sitter if possible because that will prevent pt from returning to SNF. --Palliative consult tomorrow  # CKD4 --Cr 1.99 on presentation, similar to prior measurements in the past year.   PLAN: --LR@100  for 10 hours for likely dehydration from inconsistent PO intake. --continue home sodium bicarb  # HTN --continue home metop and amlodipine   DVT prophylaxis: Heparin SQ Code Status: DNR confirmed with daughter at bedside Family Communication: updated daughter at bedside  Disposition Plan: SNF  Consults called: palliative  Admission status: Observation   Review of Systems: As per HPI otherwise 10 point review of systems negative.   Past Medical History:  Diagnosis Date  . Arthritis   . Depression   . Fall   . Hypertension  Past Surgical History:  Procedure Laterality Date  . ABDOMINAL HYSTERECTOMY    . APPENDECTOMY    . TOTAL HIP ARTHROPLASTY       reports that she has never smoked. She has never used smokeless tobacco. She reports that she does not drink alcohol and does not use drugs.  No Known Allergies  Family History  Problem Relation Age of Onset  . Liver disease Son   . Alcohol abuse Son   . Diabetes Daughter   . Heart disease Son      Prior to  Admission medications   Medication Sig Start Date End Date Taking? Authorizing Provider  acetaminophen (TYLENOL) 325 MG tablet Take 650 mg by mouth 3 (three) times daily.   Yes [provider]  amLODipine (NORVASC) 10 MG tablet TAKE ONE TABLET EVERY DAY Patient taking differently: Take 10 mg by mouth daily.  09/29/19  Yes Malfi, Lupita Raider, FNP  citalopram (CELEXA) 20 MG tablet Take 1 tablet (20 mg total) by mouth daily. 08/10/18  Yes Mikey College, NP  collagenase (SANTYL) ointment Apply 1 application topically daily. (apply to sacrum and cover with appropriate dressing)   Yes [provider]  diclofenac sodium (VOLTAREN) 1 % GEL Apply 2 g topically 4 (four) times daily. 08/10/18  Yes Mikey College, NP  HYDROcodone-acetaminophen (NORCO/VICODIN) 5-325 MG tablet Take 1 tablet by mouth every 6 (six) hours as needed for moderate pain. 11/12/19  Yes Nena Polio, MD  hydrOXYzine (ATARAX/VISTARIL) 25 MG tablet Take 25 mg by mouth 2 (two) times daily.   Yes [provider]  levofloxacin (LEVAQUIN) 750 MG tablet Take 750 mg by mouth daily. 12/10/19 12/18/19 Yes [provider]  metoprolol tartrate (LOPRESSOR) 50 MG tablet Take 1 tablet (50 mg total) by mouth 2 (two) times daily. 08/10/18  Yes Mikey College, NP  sodium bicarbonate 650 MG tablet Take 650 mg by mouth 3 (three) times daily.   Yes [provider]  traMADol (ULTRAM) 50 MG tablet Take 50 mg by mouth every 8 (eight) hours.   Yes [provider]  traZODone (DESYREL) 50 MG tablet Take 25 mg by mouth every 12 (twelve) hours as needed (anxiety or restlessness).   Yes [provider]    Physical Exam: Vitals:   12/14/19 1248 12/14/19 1400 12/14/19 1600 12/14/19 1822  BP: (!) 159/81 (!) 147/69 (!) 141/63 129/84  Pulse: 98 92 100 95  Resp: 16 19 (!) 23 (!) 23  Temp: 98.3 F (36.8 C)     SpO2: 100% 100% 100% 99%  Weight:      Height:        Constitutional: NAD,  alert, not oriented HEENT: conjunctivae and lids normal, EOMI, patches of bruising seen on her face and scalp  CV: RRR no M,R,G. Distal pulses +2.  No cyanosis.   RESP: CTA B/L, normal respiratory effort  GI: +BS, NTND Extremities: No effusions, edema, or tenderness in BLE MSK: bilateral foot drop and pointing inwards SKIN: warm, dry and intact Neuro: II - XII grossly intact.  Sensation intact   Labs on Admission: I have personally reviewed following labs and imaging studies  CBC: Recent Labs  Lab 12/14/19 1244  WBC 18.6*  NEUTROABS 14.8*  HGB 11.0*  HCT 33.1*  MCV 90.4  PLT 836   Basic Metabolic Panel: Recent Labs  Lab 12/14/19 1244  NA 140  K 4.0  CL 109  CO2 17*  GLUCOSE 111*  BUN 46*  CREATININE 1.99*  CALCIUM 9.8   GFR: Estimated Creatinine Clearance: 19.1 mL/min (A) (by C-G formula based on SCr of 1.99 mg/dL (H)). Liver Function Tests: Recent Labs  Lab 12/14/19 1244  AST 20  ALT 13  ALKPHOS 89  BILITOT 0.8  PROT 6.7  ALBUMIN 3.2*   No results for input(s): LIPASE, AMYLASE in the last 168 hours. No results for input(s): AMMONIA in the last 168 hours. Coagulation Profile: No results for input(s): INR, PROTIME in the last 168 hours. Cardiac Enzymes: No results for input(s): CKTOTAL, CKMB, CKMBINDEX, TROPONINI in the last 168 hours. BNP (last 3 results) No results for input(s): PROBNP in the last 8760 hours. HbA1C: No results for input(s): HGBA1C in the last 72 hours. CBG: No results for input(s): GLUCAP in the last 168 hours. Lipid Profile: No results for input(s): CHOL, HDL, LDLCALC, TRIG, CHOLHDL, LDLDIRECT in the last 72 hours. Thyroid Function Tests: No results for input(s): TSH, T4TOTAL, FREET4, T3FREE, THYROIDAB in the last 72 hours. Anemia Panel: No results for input(s): VITAMINB12, FOLATE, FERRITIN, TIBC, IRON, RETICCTPCT in the last 72 hours. Urine analysis:    Component Value Date/Time   COLORURINE YELLOW (A) 12/14/2019 1410    APPEARANCEUR CLOUDY (A) 12/14/2019 1410   LABSPEC 1.012 12/14/2019 1410   PHURINE 5.0 12/14/2019 1410   GLUCOSEU NEGATIVE 12/14/2019 1410   HGBUR SMALL (A) 12/14/2019 1410   BILIRUBINUR NEGATIVE 12/14/2019 1410   BILIRUBINUR negative 09/03/2019 1408   KETONESUR 5 (A) 12/14/2019 1410   PROTEINUR 100 (A) 12/14/2019 1410   UROBILINOGEN 0.2 09/03/2019 1408   NITRITE NEGATIVE 12/14/2019 1410   LEUKOCYTESUR LARGE (A) 12/14/2019 1410    Radiological Exams on Admission: DG Chest 1 View  Result Date: 12/14/2019 CLINICAL DATA:  Fall. EXAM: CHEST  1 VIEW COMPARISON:  December 22, 2017. FINDINGS: Stable cardiomegaly. No pneumothorax or pleural effusion is noted. Both lungs are clear. The visualized skeletal structures are unremarkable. IMPRESSION: No active disease. Electronically Signed   By: Marijo Conception M.D.   On: 12/14/2019 14:01   DG Pelvis 1-2 Views  Result Date: 12/14/2019 CLINICAL DATA:  Fall. EXAM: PELVIS - 1-2 VIEW COMPARISON:  August 10, 2006. FINDINGS: Status post surgical internal fixation of old proximal left femoral fracture. Severe degenerative changes seen involving the right hip. No acute fracture or dislocation is noted. Vascular calcifications are noted. IMPRESSION: Severe degenerative joint disease of the right hip. No acute abnormality seen in the pelvis. Electronically Signed   By: Marijo Conception M.D.   On: 12/14/2019 14:00   DG Tibia/Fibula Left  Result Date: 12/14/2019 CLINICAL DATA:  Fall today. EXAM: LEFT TIBIA AND FIBULA - 2 VIEW COMPARISON:  None. FINDINGS: There is no evidence of fracture or other focal bone lesions. Soft tissues are unremarkable. IMPRESSION: Negative. Electronically Signed   By: Marijo Conception M.D.   On: 12/14/2019 13:58   CT Head Wo Contrast  Result Date: 12/14/2019 CLINICAL DATA:  Unwitnessed fall EXAM: CT HEAD WITHOUT CONTRAST CT CERVICAL SPINE WITHOUT CONTRAST TECHNIQUE: Multidetector CT imaging of the head and cervical spine was performed  following the standard protocol without intravenous contrast. Multiplanar CT image reconstructions of the cervical spine were also generated. COMPARISON:  12/01/2019 FINDINGS: CT HEAD FINDINGS Brain: No evidence of acute infarction, hemorrhage, hydrocephalus, extra-axial collection or mass lesion/mass effect. Periventricular and deep white matter hypodensity. Vascular: No hyperdense vessel or unexpected calcification. Skull: Normal. Negative for fracture or focal lesion. Sinuses/Orbits: No acute finding. Other: None. CT CERVICAL SPINE FINDINGS Alignment:  Normal. Skull base and vertebrae: No acute fracture. No primary bone lesion or focal pathologic process. Soft tissues and spinal canal: No prevertebral fluid or swelling. No visible canal hematoma. Disc levels: Partial bony ankylosis of C5 through C7. Mild to moderate disc space height loss elsewhere. Upper chest: Negative. Other: Enlarged, heterogeneous thyroid. IMPRESSION: 1. No acute intracranial pathology. Small-vessel white matter disease. 2. No fracture or static subluxation of the cervical spine. Degenerative findings and ankylosis as above. 3. Enlarged, heterogeneous thyroid. Consider dedicated thyroid ultrasound if clinically appropriate given age and comorbidity. (Ref: J Am Coll Radiol. 2015 Feb;12(2): 143-50). Electronically Signed   By: Eddie Candle M.D.   On: 12/14/2019 13:28   CT Cervical Spine Wo Contrast  Result Date: 12/14/2019 CLINICAL DATA:  Unwitnessed fall EXAM: CT HEAD WITHOUT CONTRAST CT CERVICAL SPINE WITHOUT CONTRAST TECHNIQUE: Multidetector CT imaging of the head and cervical spine was performed following the standard protocol without intravenous contrast. Multiplanar CT image reconstructions of the cervical spine were also generated. COMPARISON:  12/01/2019 FINDINGS: CT HEAD FINDINGS Brain: No evidence of acute infarction, hemorrhage, hydrocephalus, extra-axial collection or mass lesion/mass effect. Periventricular and deep white  matter hypodensity. Vascular: No hyperdense vessel or unexpected calcification. Skull: Normal. Negative for fracture or focal lesion. Sinuses/Orbits: No acute finding. Other: None. CT CERVICAL SPINE FINDINGS Alignment: Normal. Skull base and vertebrae: No acute fracture. No primary bone lesion or focal pathologic process. Soft tissues and spinal canal: No prevertebral fluid or swelling. No visible canal hematoma. Disc levels: Partial bony ankylosis of C5 through C7. Mild to moderate disc space height loss elsewhere. Upper chest: Negative. Other: Enlarged, heterogeneous thyroid. IMPRESSION: 1. No acute intracranial pathology. Small-vessel white matter disease. 2. No fracture or static subluxation of the cervical spine. Degenerative findings and ankylosis as above. 3. Enlarged, heterogeneous thyroid. Consider dedicated thyroid ultrasound if clinically appropriate given age and comorbidity. (Ref: J Am Coll Radiol. 2015 Feb;12(2): 143-50). Electronically Signed   By: Eddie Candle M.D.   On: 12/14/2019 13:28   DG Shoulder Left  Result Date: 12/14/2019 CLINICAL DATA:  Pain following fall EXAM: LEFT SHOULDER - 2+ VIEW COMPARISON:  None. FINDINGS: Frontal, oblique, and Y scapular images were obtained. No acute fracture or dislocation. Calcification along the lateral left humeral head is likely due to calcific tendinosis. There is mild narrowing of the glenohumeral and acromioclavicular joints. No erosive change. Visualized left lung clear. IMPRESSION: No appreciable acute fracture or dislocation. Calcification lateral to the left humeral head likely represents focal calcific tendinosis. Mild generalized joint space narrowing noted. Electronically Signed   By: Lowella Grip III M.D.   On: 12/14/2019 14:53   DG Femur Min 2 Views Left  Result Date: 12/14/2019 CLINICAL DATA:  Status post fall. EXAM: LEFT FEMUR 2 VIEWS COMPARISON:  None. FINDINGS: Status post surgical internal fixation of old proximal left femoral  fracture. Heterotopic bone formation is noted. No acute fracture or dislocation is noted. Vascular calcifications are noted. IMPRESSION: No acute abnormality seen in the left femur. Electronically Signed   By: Marijo Conception M.D.   On: 12/14/2019 13:57      Enzo Bi MD [facility]  If 7PM-7AM, please contact night-coverage 12/14/2019, 7:29 PM

## 2019-12-14 NOTE — ED Notes (Signed)
Pt brief changed at this time and patient clean/dry. Patient placed on external urinary catheter, patient tolerated well. Will straight cath for urine if unable to void. Will continue to monitor.

## 2019-12-14 NOTE — ED Triage Notes (Signed)
Pt arrival via [facility] from [facility] due to fall. EMS states that patient had an unwitnessed fall in room and that she has deformities to her left shoulder and leg. Patient has shortening and inward rotation of left leg. Patient also has more swelling to left side of face, but bruises are thought to be old.   Pt is currently complaining of neck/back pain. Pt has neck collar in place at this time. EMS states patient has been on abx for a UTI and supposed to be getting fluids but staff told EMS that the treatment hasn't been very successful due to pt pulling out her IV.  Pt has baseline dementia.

## 2019-12-14 NOTE — ED Notes (Signed)
This RN attempted to stick patient at this time, patient is currently agitated and appears to be 'sun downing' per patient's daughter. Daughter requests we come back and stick her another time.

## 2019-12-14 NOTE — Consult Note (Signed)
PHARMACY -  BRIEF ANTIBIOTIC NOTE   Pharmacy has received consult(s) for UTI from an ED provider.  The patient's profile has been reviewed for ht/wt/allergies/indication/available labs.    One time order(s) placed for Cefepime 2g x 1.   Further antibiotics/pharmacy consults should be ordered by admitting physician if indicated.                       Thank you, Oswald Hillock 12/14/2019  4:00 PM

## 2019-12-14 NOTE — ED Provider Notes (Signed)
[facility] Emergency Department Provider Note    First MD Initiated Contact with Patient 12/14/19 1235     (approximate)  I have reviewed the triage vital signs and the nursing notes.   HISTORY  Chief Complaint Fall  Level V Caveat:  AMS  HPI Carolyn Hernandez is a 86 y.o. female with dense dementia presenting from [facility] due to frequent falls.  Report has been on antibiotics for recent UTI.  Had a mechanical fall today hitting her forehead left side of her body.  States she has pain all over.  Denies any chest pain or shortness of breath.  Does not wear home oxygen.  Denies any nausea or vomiting.    Past Medical History:  Diagnosis Date  . Arthritis   . Depression   . Fall   . Hypertension    Family History  Problem Relation Age of Onset  . Liver disease Son   . Alcohol abuse Son   . Diabetes Daughter   . Heart disease Son    Past Surgical History:  Procedure Laterality Date  . ABDOMINAL HYSTERECTOMY    . APPENDECTOMY    . TOTAL HIP ARTHROPLASTY     Patient Active Problem List   Diagnosis Date Noted  . Pressure injury of skin 11/12/2019  . Dementia ([place]) 12/29/2017  . ARF (acute renal failure) ([place]) 12/23/2017  . Macular degeneration 10/06/2017  . Moderate single current episode of major depressive disorder ([place]) 10/06/2017  . Murmur, heart 09/02/2016  . Pedal edema 11/20/2015  . Dilated cardiomyopathy ([place]) 08/28/2015  . Primary osteoarthritis of both knees 01/27/2015  . Diabetes mellitus type II, controlled, with no complications ([place]) 59/16/3846  . Low TSH level 01/02/2015  . HTN (hypertension) 04/04/2014      Prior to Admission medications   Medication Sig Start Date End Date Taking? Authorizing Provider  amLODipine (NORVASC) 10 MG tablet TAKE ONE TABLET EVERY DAY Patient not taking: Reported on 11/18/2019 09/29/19   Verl Bangs, FNP  citalopram (CELEXA) 20 MG tablet Take 1 tablet (20 mg total) by mouth  daily. Patient not taking: Reported on 11/18/2019 08/10/18   Mikey College, NP  diclofenac sodium (VOLTAREN) 1 % GEL Apply 2 g topically 4 (four) times daily. 08/10/18   Mikey College, NP  donepezil (ARICEPT) 5 MG tablet TAKE 1 TABLET AT BEDTIME Patient not taking: Reported on 11/18/2019 05/31/19   Olin Hauser, DO  hydrochlorothiazide (HYDRODIURIL) 12.5 MG tablet TAKE 1 TABLET BY MOUTH DAILY Patient not taking: Reported on 11/18/2019 07/19/19   Olin Hauser, DO  HYDROcodone-acetaminophen (NORCO/VICODIN) 5-325 MG tablet Take 1 tablet by mouth every 6 (six) hours as needed for moderate pain. 11/12/19   Nena Polio, MD  lisinopril (ZESTRIL) 40 MG tablet TAKE 1 TABLET BY MOUTH DAILY Patient not taking: Reported on 11/18/2019 09/28/19   Verl Bangs, FNP  metoprolol tartrate (LOPRESSOR) 50 MG tablet Take 1 tablet (50 mg total) by mouth 2 (two) times daily. 08/10/18   Mikey College, NP    Allergies Patient has no known allergies.    Social History Social History   Tobacco Use  . Smoking status: Never Smoker  . Smokeless tobacco: Never Used  Vaping Use  . Vaping Use: Never used  Substance Use Topics  . Alcohol use: No  . Drug use: No    Review of Systems Patient denies headaches, rhinorrhea, blurry vision, numbness, shortness of breath, chest pain, edema, cough, abdominal  pain, nausea, vomiting, diarrhea, dysuria, fevers, rashes or hallucinations unless otherwise stated above in HPI. ____________________________________________   PHYSICAL EXAM:  VITAL SIGNS: Vitals:   12/14/19 1248 12/14/19 1400  BP: (!) 159/81 (!) 147/69  Pulse: 98 92  Resp: 16 19  Temp: 98.3 F (36.8 C)   SpO2: 100% 100%    Constitutional: Alert and oriented.  Eyes: Conjunctivae are normal.  Head: Atraumatic. Age indeterminate bilat periorbital ecchymosis, no proptosis. 2cm superficial laceration to forehead Nose: No congestion/rhinnorhea. Mouth/Throat: Mucous  membranes are dry  Neck: No stridor. Painless ROM.  Cardiovascular: Normal rate, regular rhythm. Grossly normal heart sounds.  Good peripheral circulation. Respiratory: Normal respiratory effort.  No retractions. Lungs CTAB. Gastrointestinal: Soft and nontender. No distention. No abdominal bruits. No CVA tenderness. Genitourinary:  Musculoskeletal: scattered contusion to BLE.  No pain of right leg,  Pain with log roll of left.   No joint effusions. Neurologic:  Normal speech and language. No gross focal neurologic deficits are appreciated. No facial droop Skin:  Skin is warm, dry and intact. No rash noted. Psychiatric: Mood and affect are normal. Speech and behavior are normal.  ____________________________________________   LABS (all labs ordered are listed, but only abnormal results are displayed)  Results for orders placed or performed during the hospital encounter of 12/14/19 (from the past 24 hour(s))  CBC with Differential/Platelet     Status: Abnormal   Collection Time: 12/14/19 12:44 PM  Result Value Ref Range   WBC 18.6 (H) 4.0 - 10.5 K/uL   RBC 3.66 (L) 3.87 - 5.11 MIL/uL   Hemoglobin 11.0 (L) 12.0 - 15.0 g/dL   HCT 33.1 (L) 36 - 46 %   MCV 90.4 80.0 - 100.0 fL   MCH 30.1 26.0 - 34.0 pg   MCHC 33.2 30.0 - 36.0 g/dL   RDW 13.4 11.5 - 15.5 %   Platelets 395 150 - 400 K/uL   nRBC 0.0 0.0 - 0.2 %   Neutrophils Relative % 79 %   Neutro Abs 14.8 (H) 1.7 - 7.7 K/uL   Lymphocytes Relative 12 %   Lymphs Abs 2.2 0.7 - 4.0 K/uL   Monocytes Relative 6 %   Monocytes Absolute 1.1 (H) 0 - 1 K/uL   Eosinophils Relative 2 %   Eosinophils Absolute 0.4 0 - 0 K/uL   Basophils Relative 0 %   Basophils Absolute 0.1 0 - 0 K/uL   Immature Granulocytes 1 %   Abs Immature Granulocytes 0.12 (H) 0.00 - 0.07 K/uL  Comprehensive metabolic panel     Status: Abnormal   Collection Time: 12/14/19 12:44 PM  Result Value Ref Range   Sodium 140 135 - 145 mmol/L   Potassium 4.0 3.5 - 5.1 mmol/L    Chloride 109 98 - 111 mmol/L   CO2 17 (L) 22 - 32 mmol/L   Glucose, Bld 111 (H) 70 - 99 mg/dL   BUN 46 (H) 8 - 23 mg/dL   Creatinine, Ser 1.99 (H) 0.44 - 1.00 mg/dL   Calcium 9.8 8.9 - 10.3 mg/dL   Total Protein 6.7 6.5 - 8.1 g/dL   Albumin 3.2 (L) 3.5 - 5.0 g/dL   AST 20 15 - 41 U/L   ALT 13 0 - 44 U/L   Alkaline Phosphatase 89 38 - 126 U/L   Total Bilirubin 0.8 0.3 - 1.2 mg/dL   GFR calc non Af Amer 22 (L) >60 mL/min   GFR calc Af Amer 26 (L) >60 mL/min   Anion gap  14 5 - 15   ____________________________________________  EKG My review and personal interpretation at Time: 13:00   Indication: fall  Rate: 95  Rhythm: sinus Axis: left Other: rbbb, no stemi ____________________________________________  RADIOLOGY  I personally reviewed all radiographic images ordered to evaluate for the above acute complaints and reviewed radiology reports and findings.  These findings were personally discussed with the patient.  Please see medical record for radiology report.  ____________________________________________   PROCEDURES  Procedure(s) performed:  Procedures    Critical Care performed: no ____________________________________________   INITIAL IMPRESSION / ASSESSMENT AND PLAN / ED COURSE  Pertinent labs & imaging results that were available during my care of the patient were reviewed by me and considered in my medical decision making (see chart for details).   DDX: fracture, contusion, dislocation, ich, electrolyte abn, uti  BECKHAM BUXBAUM is a 86 y.o. who presents to the ED with fall today and symptoms as described above.  Patient frail appearing no review of records it does seem the family have been moving towards comfort measures only.  She did recently complete a course of Cipro is some question as to whether there is appropriately treated her UTI.  Will order x-ray imaging to evaluate for traumatic injury.  Symptoms primarily mechanical fall.  Will give gentle IV  hydration.  Will check blood work.  Clinical Course as of Dec 14 1503  Tue Dec 14, 2019  1427 Laceration was well approximated with 2 Steri-Strips.   [PR]  6644 Patient now with mild increase in white count since previous but no other signs of sepsis.  Will obtain urine if if there is signs of persistent cystitis I do believe she is going to require hospitalization for IV antibiotics.  Patient be signed out to oncoming physician pending reassessment.   [PR]    Clinical Course User Index [PR] Merlyn Lot, MD    The patient was evaluated in Emergency Department today for the symptoms described in the history of present illness. He/she was evaluated in the context of the global COVID-19 pandemic, which necessitated consideration that the patient might be at risk for infection with the SARS-CoV-2 virus that causes COVID-19. Institutional protocols and algorithms that pertain to the evaluation of patients at risk for COVID-19 are in a state of rapid change based on information released by regulatory bodies including the CDC and federal and state organizations. These policies and algorithms were followed during the patient's care in the ED.  As part of my medical decision making, I reviewed the following data within the [place] notes reviewed and incorporated, Labs reviewed, notes from prior ED visits and [place] Controlled Substance Database   ____________________________________________   FINAL CLINICAL IMPRESSION(S) / ED DIAGNOSES  Final diagnoses:  Fall, initial encounter  Laceration of forehead, initial encounter  Weakness      NEW MEDICATIONS STARTED DURING THIS VISIT:  New Prescriptions   No medications on file     Note:  This document was prepared using Dragon voice recognition software and may include unintentional dictation errors.    Merlyn Lot, MD 12/14/19 1505

## 2019-12-15 DIAGNOSIS — N184 Chronic kidney disease, stage 4 (severe): Secondary | ICD-10-CM | POA: Diagnosis not present

## 2019-12-15 DIAGNOSIS — R296 Repeated falls: Secondary | ICD-10-CM | POA: Diagnosis not present

## 2019-12-15 DIAGNOSIS — Z66 Do not resuscitate: Secondary | ICD-10-CM | POA: Diagnosis present

## 2019-12-15 DIAGNOSIS — I480 Paroxysmal atrial fibrillation: Secondary | ICD-10-CM | POA: Diagnosis not present

## 2019-12-15 DIAGNOSIS — R531 Weakness: Secondary | ICD-10-CM | POA: Diagnosis present

## 2019-12-15 DIAGNOSIS — F0390 Unspecified dementia without behavioral disturbance: Secondary | ICD-10-CM | POA: Diagnosis present

## 2019-12-15 DIAGNOSIS — I10 Essential (primary) hypertension: Secondary | ICD-10-CM | POA: Diagnosis not present

## 2019-12-15 DIAGNOSIS — E872 Acidosis: Secondary | ICD-10-CM

## 2019-12-15 DIAGNOSIS — B379 Candidiasis, unspecified: Secondary | ICD-10-CM | POA: Diagnosis not present

## 2019-12-15 DIAGNOSIS — M2550 Pain in unspecified joint: Secondary | ICD-10-CM | POA: Diagnosis not present

## 2019-12-15 DIAGNOSIS — F0281 Dementia in other diseases classified elsewhere with behavioral disturbance: Secondary | ICD-10-CM

## 2019-12-15 DIAGNOSIS — R8271 Bacteriuria: Secondary | ICD-10-CM | POA: Diagnosis present

## 2019-12-15 DIAGNOSIS — F419 Anxiety disorder, unspecified: Secondary | ICD-10-CM | POA: Diagnosis present

## 2019-12-15 DIAGNOSIS — Z23 Encounter for immunization: Secondary | ICD-10-CM | POA: Diagnosis not present

## 2019-12-15 DIAGNOSIS — W19XXXA Unspecified fall, initial encounter: Secondary | ICD-10-CM | POA: Diagnosis not present

## 2019-12-15 DIAGNOSIS — Z79899 Other long term (current) drug therapy: Secondary | ICD-10-CM | POA: Diagnosis not present

## 2019-12-15 DIAGNOSIS — G20 Parkinson's disease: Secondary | ICD-10-CM | POA: Diagnosis not present

## 2019-12-15 DIAGNOSIS — Z7401 Bed confinement status: Secondary | ICD-10-CM | POA: Diagnosis not present

## 2019-12-15 DIAGNOSIS — Z743 Need for continuous supervision: Secondary | ICD-10-CM | POA: Diagnosis not present

## 2019-12-15 DIAGNOSIS — R32 Unspecified urinary incontinence: Secondary | ICD-10-CM

## 2019-12-15 DIAGNOSIS — W1830XA Fall on same level, unspecified, initial encounter: Secondary | ICD-10-CM | POA: Diagnosis present

## 2019-12-15 DIAGNOSIS — J440 Chronic obstructive pulmonary disease with acute lower respiratory infection: Secondary | ICD-10-CM | POA: Diagnosis not present

## 2019-12-15 DIAGNOSIS — J449 Chronic obstructive pulmonary disease, unspecified: Secondary | ICD-10-CM | POA: Diagnosis not present

## 2019-12-15 DIAGNOSIS — E119 Type 2 diabetes mellitus without complications: Secondary | ICD-10-CM | POA: Diagnosis present

## 2019-12-15 DIAGNOSIS — I129 Hypertensive chronic kidney disease with stage 1 through stage 4 chronic kidney disease, or unspecified chronic kidney disease: Secondary | ICD-10-CM | POA: Diagnosis present

## 2019-12-15 DIAGNOSIS — R404 Transient alteration of awareness: Secondary | ICD-10-CM | POA: Diagnosis not present

## 2019-12-15 DIAGNOSIS — Z515 Encounter for palliative care: Secondary | ICD-10-CM

## 2019-12-15 DIAGNOSIS — Z20822 Contact with and (suspected) exposure to covid-19: Secondary | ICD-10-CM | POA: Diagnosis present

## 2019-12-15 DIAGNOSIS — S0181XA Laceration without foreign body of other part of head, initial encounter: Secondary | ICD-10-CM | POA: Diagnosis present

## 2019-12-15 DIAGNOSIS — I4510 Unspecified right bundle-branch block: Secondary | ICD-10-CM | POA: Diagnosis present

## 2019-12-15 DIAGNOSIS — Z833 Family history of diabetes mellitus: Secondary | ICD-10-CM | POA: Diagnosis not present

## 2019-12-15 DIAGNOSIS — Z96649 Presence of unspecified artificial hip joint: Secondary | ICD-10-CM | POA: Diagnosis present

## 2019-12-15 DIAGNOSIS — K649 Unspecified hemorrhoids: Secondary | ICD-10-CM | POA: Diagnosis present

## 2019-12-15 DIAGNOSIS — Z7189 Other specified counseling: Secondary | ICD-10-CM

## 2019-12-15 DIAGNOSIS — R402411 Glasgow coma scale score 13-15, in the field [EMT or ambulance]: Secondary | ICD-10-CM | POA: Diagnosis not present

## 2019-12-15 DIAGNOSIS — G309 Alzheimer's disease, unspecified: Secondary | ICD-10-CM

## 2019-12-15 DIAGNOSIS — F05 Delirium due to known physiological condition: Secondary | ICD-10-CM | POA: Diagnosis present

## 2019-12-15 DIAGNOSIS — R5381 Other malaise: Secondary | ICD-10-CM | POA: Diagnosis present

## 2019-12-15 LAB — COMPREHENSIVE METABOLIC PANEL
ALT: 13 U/L (ref 0–44)
AST: 20 U/L (ref 15–41)
Albumin: 3 g/dL — ABNORMAL LOW (ref 3.5–5.0)
Alkaline Phosphatase: 77 U/L (ref 38–126)
Anion gap: 9 (ref 5–15)
BUN: 47 mg/dL — ABNORMAL HIGH (ref 8–23)
CO2: 19 mmol/L — ABNORMAL LOW (ref 22–32)
Calcium: 9.6 mg/dL (ref 8.9–10.3)
Chloride: 114 mmol/L — ABNORMAL HIGH (ref 98–111)
Creatinine, Ser: 1.88 mg/dL — ABNORMAL HIGH (ref 0.44–1.00)
GFR calc Af Amer: 27 mL/min — ABNORMAL LOW (ref >60)
GFR calc non Af Amer: 23 mL/min — ABNORMAL LOW (ref >60)
Glucose, Bld: 140 mg/dL — ABNORMAL HIGH (ref 70–99)
Potassium: 4.3 mmol/L (ref 3.5–5.1)
Sodium: 142 mmol/L (ref 135–145)
Total Bilirubin: 0.6 mg/dL (ref 0.3–1.2)
Total Protein: 6.2 g/dL — ABNORMAL LOW (ref 6.5–8.1)

## 2019-12-15 LAB — CBC WITH DIFFERENTIAL/PLATELET
Abs Immature Granulocytes: 0.06 10*3/uL (ref 0.00–0.07)
Basophils Absolute: 0 10*3/uL (ref 0.0–0.1)
Basophils Relative: 0 %
Eosinophils Absolute: 0.2 10*3/uL (ref 0.0–0.5)
Eosinophils Relative: 2 %
HCT: 33.1 % — ABNORMAL LOW (ref 36.0–46.0)
Hemoglobin: 10.9 g/dL — ABNORMAL LOW (ref 12.0–15.0)
Immature Granulocytes: 0 %
Lymphocytes Relative: 14 %
Lymphs Abs: 2.1 10*3/uL (ref 0.7–4.0)
MCH: 30 pg (ref 26.0–34.0)
MCHC: 32.9 g/dL (ref 30.0–36.0)
MCV: 91.2 fL (ref 80.0–100.0)
Monocytes Absolute: 1.1 10*3/uL — ABNORMAL HIGH (ref 0.1–1.0)
Monocytes Relative: 7 %
Neutro Abs: 11.4 10*3/uL — ABNORMAL HIGH (ref 1.7–7.7)
Neutrophils Relative %: 77 %
Platelets: 340 10*3/uL (ref 150–400)
RBC: 3.63 MIL/uL — ABNORMAL LOW (ref 3.87–5.11)
RDW: 13.4 % (ref 11.5–15.5)
WBC: 14.9 10*3/uL — ABNORMAL HIGH (ref 4.0–10.5)
nRBC: 0 % (ref 0.0–0.2)

## 2019-12-15 MED ORDER — HEPARIN SODIUM (PORCINE) 5000 UNIT/ML IJ SOLN
5000.0000 [IU] | Freq: Three times a day (TID) | INTRAMUSCULAR | Status: DC
  Administered 2019-12-15 – 2019-12-16 (×4): 5000 [IU] via SUBCUTANEOUS
  Filled 2019-12-15 (×5): qty 1

## 2019-12-15 MED ORDER — CITALOPRAM HYDROBROMIDE 20 MG PO TABS
20.0000 mg | ORAL_TABLET | Freq: Every day | ORAL | Status: DC
  Administered 2019-12-15 – 2019-12-16 (×2): 20 mg via ORAL
  Filled 2019-12-15 (×2): qty 1

## 2019-12-15 MED ORDER — ACETAMINOPHEN 325 MG PO TABS: 650.0000 mg | ORAL_TABLET | Freq: Four times a day (QID) | ORAL | Status: DC | PRN

## 2019-12-15 MED ORDER — SODIUM BICARBONATE 650 MG PO TABS
650.0000 mg | ORAL_TABLET | Freq: Three times a day (TID) | ORAL | Status: DC
  Administered 2019-12-15 – 2019-12-16 (×6): 650 mg via ORAL
  Filled 2019-12-15 (×13): qty 1

## 2019-12-15 NOTE — Progress Notes (Signed)
PROGRESS NOTE  Carolyn Hernandez JGG:[id number] DOB: [birth year] DOA: 12/14/2019 PCP: Verl Bangs, FNP  Brief History   LANEAH LUFT is a 86 y.o. female with medical history significant of advanced dementia, frequent falls, HTN who presented from SNF after an unwitnessed fall.  Pt has advanced dementia, so HPI obtained from her daughter at bedside.  Pt has had frequent falls, and about 6 weeks ago, pt stopped being able to walk and had moved from assisted living to SNF.  Pt had most likely hit her head during those falls because pt had bruising to her face.  Pt has had multiple ED visits after these falls (3 in the past month), and was dx with UTI during one of those visits.  Pt has had no urinary symptoms, dysuria, abdominal pain or fevers, per daughter.  Daughter didn't know whether pt was having normal PO intake or hydration at the SNF because pt couldn't remember whether she had had a meal or not.  Daughter said pt "sun downs" and becomes more confused and agitated at night.  Pt herself did complain of pain of various places at various times.  According to medical records, pt had a course of Keflex prescribed on 5/28 and Levaquin prescribed on 6/25.  Last urine cx on 6/16 (obtained during ED visit) grew pseudomonas, but abx was not started at that time due to no signs of UTI.  In the ED the patient was afebrile, pulse 98, BP 159/81, sating 100% on room air.  Labs notable for WBC 18.6, Cr 1.99, lactic acid 1.1, UA showed neg nitrite/large leuk/many bacteria, CXR no acute finding, pan skeletal xrays showed no acute fractures.  Pt was admitted for observation.  Consultants  . Palliative care  Procedures  . None  Antibiotics   Anti-infectives (From admission, onward)   Start     Dose/Rate Route Frequency Ordered Stop   12/14/19 1600  ceFEPIme (MAXIPIME) 2 g in sodium chloride 0.9 % 100 mL IVPB  Status:  Discontinued        2 g 200 mL/hr over 30 Minutes Intravenous  Once  12/14/19 1549 12/14/19 1743    .   Subjective  The patient is resting comfortably. No new complaints.  Objective   Vitals:  Vitals:   12/15/19 0817 12/15/19 1101  BP: 124/64 (!) 106/58  Pulse: 90 72  Resp: 20 14  Temp: 98.6 F (37 C) 98.8 F (37.1 C)  SpO2: 100% 95%   Exam:  Constitutional:  . The patient is awake, alert, and confused. No acute distress. Respiratory:  . No increased work of breathing. . No wheezes, rales, or rhonchi . No tactile fremitus Cardiovascular:  . Regular rate and rhythm . No murmurs, ectopy, or gallups. . No lateral PMI. No thrills. Abdomen:  . Abdomen is soft, non-tender, non-distended . No hernias, masses, or organomegaly . Normoactive bowel sounds.  Musculoskeletal:  . No cyanosis, clubbing, or edema Skin:  . No rashes, lesions, ulcers . palpation of skin: no induration or nodules Neurologic:  . CN 2-12 intact . Sensation all 4 extremities intact Psychiatric:  Unable to evaluate due to the patient's inability to cooperate with exam.  I have personally reviewed the following:   Today's Data  . Vitals, CBC, BMP, procalcitonin, lactic acid, metabolic acidosis  Micro Data  . Blood culture x 2 . Urine culture (12/01/2019) . Urine culture (12/14/2019)  Imaging  . CT head . CT C-spine  Scheduled Meds: . amLODipine  10 mg  Oral Daily  . citalopram  20 mg Oral Daily  . feeding supplement (ENSURE ENLIVE)  237 mL Oral BID BM  . heparin  5,000 Units Subcutaneous Q8H  . metoprolol tartrate  50 mg Oral BID  . sodium bicarbonate  650 mg Oral TID   Continuous Infusions:  Active Problems:   Fall   Falls frequently   LOS: 0 days   A & P  Debility with recurrent falls: Pt is now non-ambulatory at baseline. Due to her dementia, she continues to try to get out of bed although this is unsafe. Pt needs placement. She will continue to receive norco as needed for pain.  Leukocytosis: Likely stress reaction. No obvious source of  infection. Monitor for fever. Procalcitonin negative. WBC down somewhat due to hydration.  Urinary incontinence: Related to dementia. Noted. Urine culture positive on 12/01/2019, but this likely reflects colonization. Urine culture has been repeated. Will monitor.  Advanced dementia: Pt sundowns. Redirect as possible.  Haldol is available for agitation. Palliative care is consulted.  CKD IV: Creatinine is 1.99 on admission. The patient is receiving IV fluids. Monintor creatinine, electrolytes, and volume status. Pt is receiving oral sodium bicarbonate as prior to admission.  Hypertension: Blood pressures are low. Will hold norvasc and reduce dose of metoprolol.  I have seen and examined this patient myself. I have spent 36 minutes in his evaluation and care.  DVT Prophylaxis: Heparin CODE STATUS: DNR Family Communication: None available Disposition: The patient is from SNF. Anticipate discharge to SNF or hospice. Barriers to discharge: palliative care.  Status is: Inpatient  Remains inpatient appropriate because:Inpatient level of care appropriate due to severity of illness   Dispo: The patient is from: SNF              Anticipated d/c is to: tbd              Anticipated d/c date is: 2 days              Patient currently is not medically stable to d/c.         Javyn Havlin, DO [facility] Direct contact: see [email or web address]  7PM-7AM contact night coverage as above 12/15/2019, 3:05 PM  LOS: 0 days

## 2019-12-15 NOTE — Consult Note (Signed)
Consultation Note Date: 12/15/2019   Patient Name: Carolyn Hernandez  DOB: 03-25-1930  MRN: [id number]  Age / Sex: 86 y.o., female  PCP: Lorine Bears Lupita Raider, FNP Referring Physician: Karie Kirks, DO  Reason for Consultation: Establishing goals of care  HPI/Patient Profile: Carolyn Hernandez is a 86 y.o. female with medical history significant of advanced dementia, frequent falls, HTN who presented from SNF after an unwitnessed fall.  Clinical Assessment and Goals of Care: Patient is resting in bed. She states she has 4 children, and is unable to answer additional questions. Spoke with her son who is HPOA. He states there were a total of 4 children but 1 died.   He describes his mother's decline, and change in facility placements. He states she figures out how to get out of bed, but then cannot stand and falls. He discusses her frequent falls. He discusses that she does not recognize her children and begs her son to take her home when he visits.   We discussed her diagnosis, prognosis, GOC, EOL wishes disposition and options.  A detailed discussion was had today regarding advanced directives.  Concepts specific to code status, artifical feeding and hydration, IV antibiotics and rehospitalization were discussed.  The difference between an aggressive medical intervention path and a comfort care path was discussed.  Values and goals of care important to patient and family were attempted to be elicited.  Discussed limitations of medical interventions to prolong quality of life in some situations and discussed the concept of human mortality.  He states his mother has recently failed rehab. He states he knows she will never walk, and will keep falling and injuring her self. He states he realizes her dementia will progress. He states she has had a good life, and he wants her to be "comfortable, calm, and clean".    He would like to bring his siblings in for a Grants meeting at 1:00 tomorrow.       SUMMARY OF RECOMMENDATIONS   Family meeting tomorrow at 1:00 with children.    Prognosis:   Poor      Primary Diagnoses: Present on Admission: . Fall   I have reviewed the medical record, interviewed the patient and family, and examined the patient. The following aspects are pertinent.  Past Medical History:  Diagnosis Date  . Arthritis   . Depression   . Fall   . Hypertension    Social History   Socioeconomic History  . Marital status: Married    Spouse name: Not on file  . Number of children: Not on file  . Years of education: Not on file  . Highest education level: Not on file  Occupational History  . Not on file  Tobacco Use  . Smoking status: Never Smoker  . Smokeless tobacco: Never Used  Vaping Use  . Vaping Use: Never used  Substance and Sexual Activity  . Alcohol use: No  . Drug use: No  . Sexual activity: Not Currently  Other Topics Concern  . Not on  file  Social History Narrative  . Not on file   Social Determinants of Health   Financial Resource Strain:   . Difficulty of Paying Living Expenses:   Food Insecurity:   . Worried About Charity fundraiser in the Last Year:   . Arboriculturist in the Last Year:   Transportation Needs:   . Film/video editor (Medical):   Marland Kitchen Lack of Transportation (Non-Medical):   Physical Activity:   . Days of Exercise per Week:   . Minutes of Exercise per Session:   Stress:   . Feeling of Stress :   Social Connections:   . Frequency of Communication with Friends and Family:   . Frequency of Social Gatherings with Friends and Family:   . Attends Religious Services:   . Active Member of Clubs or Organizations:   . Attends Archivist Meetings:   Marland Kitchen Marital Status:    Family History  Problem Relation Age of Onset  . Liver disease Son   . Alcohol abuse Son   . Diabetes Daughter   . Heart disease Son     Scheduled Meds: . amLODipine  10 mg Oral Daily  . citalopram  20 mg Oral Daily  . feeding supplement (ENSURE ENLIVE)  237 mL Oral BID BM  . heparin  5,000 Units Subcutaneous Q8H  . metoprolol tartrate  50 mg Oral BID  . sodium bicarbonate  650 mg Oral TID   Continuous Infusions: PRN Meds:.acetaminophen, haloperidol lactate, HYDROcodone-acetaminophen Medications Prior to Admission:  Prior to Admission medications   Medication Sig Start Date End Date Taking? Authorizing Provider  acetaminophen (TYLENOL) 325 MG tablet Take 650 mg by mouth 3 (three) times daily.   Yes [provider]  amLODipine (NORVASC) 10 MG tablet TAKE ONE TABLET EVERY DAY Patient taking differently: Take 10 mg by mouth daily.  09/29/19  Yes Malfi, Lupita Raider, FNP  citalopram (CELEXA) 20 MG tablet Take 1 tablet (20 mg total) by mouth daily. 08/10/18  Yes Mikey College, NP  collagenase (SANTYL) ointment Apply 1 application topically daily. (apply to sacrum and cover with appropriate dressing)   Yes [provider]  diclofenac sodium (VOLTAREN) 1 % GEL Apply 2 g topically 4 (four) times daily. 08/10/18  Yes Mikey College, NP  HYDROcodone-acetaminophen (NORCO/VICODIN) 5-325 MG tablet Take 1 tablet by mouth every 6 (six) hours as needed for moderate pain. 11/12/19  Yes Nena Polio, MD  hydrOXYzine (ATARAX/VISTARIL) 25 MG tablet Take 25 mg by mouth 2 (two) times daily.   Yes [provider]  levofloxacin (LEVAQUIN) 750 MG tablet Take 750 mg by mouth daily. 12/10/19 12/18/19 Yes [provider]  metoprolol tartrate (LOPRESSOR) 50 MG tablet Take 1 tablet (50 mg total) by mouth 2 (two) times daily. 08/10/18  Yes Mikey College, NP  sodium bicarbonate 650 MG tablet Take 650 mg by mouth 3 (three) times daily.   Yes [provider]  traMADol (ULTRAM) 50 MG tablet Take 50 mg by mouth every 8 (eight) hours.   Yes [provider]  traZODone (DESYREL) 50 MG tablet  Take 25 mg by mouth every 12 (twelve) hours as needed (anxiety or restlessness).   Yes [provider]   No Known Allergies Review of Systems  Unable to perform ROS   Physical Exam Pulmonary:     Effort: Pulmonary effort is normal.  Neurological:     Mental Status: She is alert.     Vital Signs:  BP (!) 106/58 (BP Location: Right Arm)   Pulse 72   Temp 98.8 F (37.1 C) (Oral)   Resp 14   Ht 5\' 1"  (1.549 m)   Wt 80 kg   SpO2 95%   BMI 33.32 kg/m  Pain Scale: Faces       SpO2: SpO2: 95 % O2 Device:SpO2: 95 % O2 Flow Rate: .   IO: Intake/output summary:   Intake/Output Summary (Last 24 hours) at 12/15/2019 1531 Last data filed at 12/15/2019 0500 Gross per 24 hour  Intake 180.64 ml  Output --  Net 180.64 ml    LBM: Last BM Date:  (unknown) Baseline Weight: Weight: 80 kg Most recent weight: Weight: 80 kg     Palliative Assessment/Data:     Time In: 2:55 Time Out: 3:30 Time Total: 35 min Greater than 50%  of this time was spent counseling and coordinating care related to the above assessment and plan.  Signed by: Asencion Gowda, NP   Please contact Palliative Medicine Team phone at [phone] for questions and concerns.  For individual provider: See Shea Evans

## 2019-12-15 NOTE — Plan of Care (Signed)

## 2019-12-16 LAB — BASIC METABOLIC PANEL
Anion gap: 11 (ref 5–15)
BUN: 42 mg/dL — ABNORMAL HIGH (ref 8–23)
CO2: 20 mmol/L — ABNORMAL LOW (ref 22–32)
Calcium: 9.9 mg/dL (ref 8.9–10.3)
Chloride: 113 mmol/L — ABNORMAL HIGH (ref 98–111)
Creatinine, Ser: 1.7 mg/dL — ABNORMAL HIGH (ref 0.44–1.00)
GFR calc Af Amer: 31 mL/min — ABNORMAL LOW (ref >60)
GFR calc non Af Amer: 26 mL/min — ABNORMAL LOW (ref >60)
Glucose, Bld: 104 mg/dL — ABNORMAL HIGH (ref 70–99)
Potassium: 3.9 mmol/L (ref 3.5–5.1)
Sodium: 144 mmol/L (ref 135–145)

## 2019-12-16 LAB — CBC
HCT: 33.2 % — ABNORMAL LOW (ref 36.0–46.0)
Hemoglobin: 10.9 g/dL — ABNORMAL LOW (ref 12.0–15.0)
MCH: 30 pg (ref 26.0–34.0)
MCHC: 32.8 g/dL (ref 30.0–36.0)
MCV: 91.5 fL (ref 80.0–100.0)
Platelets: 369 10*3/uL (ref 150–400)
RBC: 3.63 MIL/uL — ABNORMAL LOW (ref 3.87–5.11)
RDW: 13.4 % (ref 11.5–15.5)
WBC: 14.7 10*3/uL — ABNORMAL HIGH (ref 4.0–10.5)
nRBC: 0 % (ref 0.0–0.2)

## 2019-12-16 LAB — MAGNESIUM: Magnesium: 2.1 mg/dL (ref 1.7–2.4)

## 2019-12-16 MED ORDER — HALOPERIDOL LACTATE 5 MG/ML IJ SOLN: 0.5000 mg | INTRAMUSCULAR | Status: DC | PRN

## 2019-12-16 MED ORDER — POLYVINYL ALCOHOL 1.4 % OP SOLN
1.0000 [drp] | Freq: Four times a day (QID) | OPHTHALMIC | Status: DC | PRN
  Filled 2019-12-16: qty 15

## 2019-12-16 MED ORDER — ONDANSETRON HCL 4 MG/2ML IJ SOLN: 4.0000 mg | Freq: Four times a day (QID) | INTRAMUSCULAR | Status: DC | PRN

## 2019-12-16 MED ORDER — LORAZEPAM 2 MG/ML PO CONC: 1.0000 mg | ORAL | Status: DC | PRN

## 2019-12-16 MED ORDER — GLYCOPYRROLATE 1 MG PO TABS
1.0000 mg | ORAL_TABLET | ORAL | Status: DC | PRN
  Filled 2019-12-16: qty 1

## 2019-12-16 MED ORDER — HALOPERIDOL LACTATE 2 MG/ML PO CONC
0.5000 mg | ORAL | Status: DC | PRN
  Filled 2019-12-16: qty 0.3

## 2019-12-16 MED ORDER — HALOPERIDOL 0.5 MG PO TABS
0.5000 mg | ORAL_TABLET | ORAL | Status: DC | PRN
  Administered 2019-12-19: 15:00:00 0.5 mg via ORAL
  Filled 2019-12-16 (×3): qty 1

## 2019-12-16 MED ORDER — LORAZEPAM 1 MG PO TABS
1.0000 mg | ORAL_TABLET | ORAL | Status: DC | PRN
  Administered 2019-12-19: 1 mg via ORAL
  Filled 2019-12-16: qty 1

## 2019-12-16 MED ORDER — GLYCOPYRROLATE 0.2 MG/ML IJ SOLN: 0.2000 mg | INTRAMUSCULAR | Status: DC | PRN

## 2019-12-16 MED ORDER — MORPHINE SULFATE (PF) 2 MG/ML IV SOLN: 1.0000 mg | INTRAVENOUS | Status: DC | PRN

## 2019-12-16 MED ORDER — BIOTENE DRY MOUTH MT LIQD: 15.0000 mL | OROMUCOSAL | Status: DC | PRN

## 2019-12-16 MED ORDER — ONDANSETRON 4 MG PO TBDP
4.0000 mg | ORAL_TABLET | Freq: Four times a day (QID) | ORAL | Status: DC | PRN
  Filled 2019-12-16: qty 1

## 2019-12-16 NOTE — TOC Progression Note (Signed)
Transition of Care [facility]) - Progression Note    Patient Details  Name: Carolyn Hernandez MRN: [id number] Date of Birth: 09-21-1929  [facility] CM/SW Contact  Shelbie Hutching, RN Phone Number: 12/16/2019, 11:05 AM  Clinical Narrative:    This RNCM was able to speak with [name] the patient's daughter.   Debbie reports that she and her brother [name] will be coming up today at 1 for a meeting with Palliative Care.     Expected Discharge Plan: [place] Barriers to Discharge: Continued Medical Work up  Expected Discharge Plan and Services Expected Discharge Plan: [place]   Discharge Planning Services: CM Consult Post Acute Care Choice: Resumption of Svcs/PTA Provider, [place] arrangements for the past 2 months: [place] (at [place] before [employer] this month)                                       Social Determinants of Health (SDOH) Interventions    Readmission Risk Interventions No flowsheet data found.

## 2019-12-16 NOTE — Plan of Care (Signed)

## 2019-12-16 NOTE — NC FL2 (Signed)
[place] LEVEL OF CARE SCREENING TOOL     IDENTIFICATION  Patient Name: Carolyn Hernandez Birthdate: 09-Oct-1929 Sex: female Admission Date (Current Location): 12/14/2019  [place] and [place] Number:  Engineering geologist and Address:  [facility], [address] [place], [place] [address]      Provider Number: [id number]  Attending Physician Name and Address:  Karie Kirks, DO  Relative Name and Phone Number:  O[place]ia Noori HCPOA/son [phone]    Current Level of Care: Hospital Recommended Level of Care: [place] Prior Approval Number:    Date Approved/Denied:   PASRR Number: [id number]  Discharge Plan: SNF    Current Diagnoses: Patient Active Problem List   Diagnosis Date Noted  . Falls frequently 12/15/2019  . Fall 12/14/2019  . Pressure injury of skin 11/12/2019  . Dementia ([place]) 12/29/2017  . ARF (acute renal failure) ([place]) 12/23/2017  . Macular degeneration 10/06/2017  . Moderate single current episode of major depressive disorder ([place]) 10/06/2017  . Murmur, heart 09/02/2016  . Pedal edema 11/20/2015  . Dilated cardiomyopathy ([place]) 08/28/2015  . Primary osteoarthritis of both knees 01/27/2015  . Diabetes mellitus type II, controlled, with no complications ([place]) 27/74/1287  . Low TSH level 01/02/2015  . HTN (hypertension) 04/04/2014    Orientation RESPIRATION BLADDER Height & Weight     Self  Normal Incontinent Weight: 80 kg Height:  5\' 1"  (154.9 cm)  BEHAVIORAL SYMPTOMS/MOOD NEUROLOGICAL BOWEL NUTRITION STATUS      Incontinent Diet (see discharge summary)  AMBULATORY STATUS COMMUNICATION OF NEEDS Skin   Extensive Assist Verbally Bruising, Skin abrasions (abrasion to forehead and bruising)                       Personal Care Assistance Level of Assistance  Bathing, Feeding, Dressing Bathing Assistance: Maximum assistance Feeding assistance: Limited assistance Dressing Assistance:  Maximum assistance     Functional Limitations Info             SPECIAL CARE FACTORS FREQUENCY                       Contractures Contractures Info: Not present    Additional Factors Info  Code Status, Allergies Code Status Info: DNR Allergies Info: NKA           Current Medications (12/16/2019):  This is the current hospital active medication list Current Facility-Administered Medications  Medication Dose Route Frequency Provider Last Rate Last Admin  . acetaminophen (TYLENOL) tablet 650 mg  650 mg Oral Q6H PRN Swayze, Ava, DO      . amLODipine (NORVASC) tablet 10 mg  10 mg Oral Daily Enzo Bi, MD   10 mg at 12/16/19 0907  . citalopram (CELEXA) tablet 20 mg  20 mg Oral Daily Enzo Bi, MD   20 mg at 12/16/19 0907  . feeding supplement (ENSURE ENLIVE) (ENSURE ENLIVE) liquid 237 mL  237 mL Oral BID BM Enzo Bi, MD   237 mL at 12/16/19 0907  . heparin injection 5,000 Units  5,000 Units Subcutaneous Q8H Enzo Bi, MD   5,000 Units at 12/15/19 2041  . HYDROcodone-acetaminophen (NORCO/VICODIN) 5-325 MG per tablet 1 tablet  1 tablet Oral Q6H PRN Enzo Bi, MD   1 tablet at 12/16/19 0906  . metoprolol tartrate (LOPRESSOR) tablet 50 mg  50 mg Oral BID Enzo Bi, MD   50 mg at 12/16/19 0907  . sodium bicarbonate tablet 650 mg  650 mg Oral TID Enzo Bi, MD   650 mg at 12/16/19 0907     Discharge Medications: Please see discharge summary for a list of discharge medications.  Relevant Imaging Results:  Relevant Lab Results:   Additional Information SS# [id number]  Shelbie Hutching, RN

## 2019-12-16 NOTE — Progress Notes (Signed)
PROGRESS NOTE  Carolyn Hernandez IWO:[id number] DOB: 12/12/1929 DOA: 12/14/2019 PCP: Verl Bangs, FNP  Brief History   Carolyn Hernandez is a 86 y.o. female with medical history significant of advanced dementia, frequent falls, HTN who presented from SNF after an unwitnessed fall.  Pt has advanced dementia, so HPI obtained from her daughter at bedside.  Pt has had frequent falls, and about 6 weeks ago, pt stopped being able to walk and had moved from assisted living to SNF.  Pt had most likely hit her head during those falls because pt had bruising to her face.  Pt has had multiple ED visits after these falls (3 in the past month), and was dx with UTI during one of those visits.  Pt has had no urinary symptoms, dysuria, abdominal pain or fevers, per daughter.  Daughter didn't know whether pt was having normal PO intake or hydration at the SNF because pt couldn't remember whether she had had a meal or not.  Daughter said pt "sun downs" and becomes more confused and agitated at night.  Pt herself did complain of pain of various places at various times.  According to medical records, pt had a course of Keflex prescribed on 5/28 and Levaquin prescribed on 6/25.  Last urine cx on 6/16 (obtained during ED visit) grew pseudomonas, but abx was not started at that time due to no signs of UTI.  In the ED the patient was afebrile, pulse 98, BP 159/81, sating 100% on room air.  Labs notable for WBC 18.6, Cr 1.99, lactic acid 1.1, UA showed neg nitrite/large leuk/many bacteria, CXR no acute finding, pan skeletal xrays showed no acute fractures.  Pt was admitted to a telemetry bed.   Palliative care has been consulted. They will meet with the family at 29 today for a goals of care discussion.   Consultants  . Palliative care  Procedures  . None  Antibiotics   Anti-infectives (From admission, onward)   Start     Dose/Rate Route Frequency Ordered Stop   12/14/19 1600  ceFEPIme (MAXIPIME) 2  g in sodium chloride 0.9 % 100 mL IVPB  Status:  Discontinued        2 g 200 mL/hr over 30 Minutes Intravenous  Once 12/14/19 1549 12/14/19 1743      Subjective  The patient is resting comfortably. No new complaints.  Objective   Vitals:  Vitals:   12/16/19 0741 12/16/19 1107  BP: (!) 155/57 (!) 135/59  Pulse: 81 77  Resp: 16 18  Temp: 98 F (36.7 C) 98.3 F (36.8 C)  SpO2: 98% 98%   Exam:  Constitutional:  . The patient is awake, alert, and confused. No acute distress. Respiratory:  . No increased work of breathing. . No wheezes, rales, or rhonchi . No tactile fremitus Cardiovascular:  . Regular rate and rhythm . No murmurs, ectopy, or gallups. . No lateral PMI. No thrills. Abdomen:  . Abdomen is soft, non-tender, non-distended . No hernias, masses, or organomegaly . Normoactive bowel sounds.  Musculoskeletal:  . No cyanosis, clubbing, or edema Skin:  . No rashes, lesions, ulcers . palpation of skin: no induration or nodules Neurologic:  . CN 2-12 intact . Sensation all 4 extremities intact Psychiatric:  Unable to evaluate due to the patient's inability to cooperate with exam.  I have personally reviewed the following:   Today's Data  . Vitals, CBC, BMP  Micro Data  . Blood culture x 2 . Urine culture (12/01/2019) .  Urine culture (12/14/2019)  Imaging  . CT head . CT C-spine  Scheduled Meds: . amLODipine  10 mg Oral Daily  . citalopram  20 mg Oral Daily  . feeding supplement (ENSURE ENLIVE)  237 mL Oral BID BM  . heparin  5,000 Units Subcutaneous Q8H  . metoprolol tartrate  50 mg Oral BID  . sodium bicarbonate  650 mg Oral TID   Continuous Infusions:  Active Problems:   Fall   Falls frequently   LOS: 1 day   A & P  Debility with recurrent falls: Pt is now non-ambulatory at baseline. Due to her dementia, she continues to try to get out of bed although this is unsafe. Pt needs placement. She will continue to receive norco as needed for  pain.  Leukocytosis: Likely stress reaction. No obvious source of infection. Monitor for fever. Procalcitonin negative. WBC down somewhat due to hydration.  Urinary incontinence: Related to dementia. Noted. Urine culture positive on 12/01/2019, but this likely reflects colonization. Urine culture has been repeated. Will monitor.  Advanced dementia: Pt sundowns. Redirect as possible.  Haldol is available for agitation. Palliative care is consulted. Family meeting this afternoon regarding goals of care.  CKD IV: Creatinine is 1.99 on admission. The patient is receiving IV fluids. Monintor creatinine, electrolytes, and volume status. Pt is receiving oral sodium bicarbonate as prior to admission.  Hypertension: Blood pressures are low. Will hold norvasc and reduce dose of metoprolol.  I have seen and examined this patient myself. I have spent 34 minutes in his evaluation and care.  DVT Prophylaxis: Heparin CODE STATUS: DNR Family Communication: None available Disposition: The patient is from SNF. Anticipate discharge to SNF or hospice. Barriers to discharge: palliative care.  Status is: Inpatient  Remains inpatient appropriate because:Inpatient level of care appropriate due to severity of illness   Dispo: The patient is from: SNF              Anticipated d/c is to: tbd              Anticipated d/c date is: 2 days              Patient currently is not medically stable to d/c.  Carolyn Liskey, DO [facility] Direct contact: see [email or web address]  7PM-7AM contact night coverage as above 12/16/2019, 1:59 PM  LOS: 0 days

## 2019-12-16 NOTE — Progress Notes (Addendum)
Daily Progress Note   Patient Name: Carolyn Hernandez       Date: 12/16/2019 DOB: 08-25-1929  Age: 86 y.o. MRN#: [id number] Attending Physician: Karie Kirks, DO Primary Care Physician: Verl Bangs, FNP Admit Date: 12/14/2019  Reason for Consultation/Follow-up: Establishing goals of care  Subjective: Patient is resting in bed. Son [name] is at bedside and daughter [name]. DIL is present as Shanon Brow is not able to be here.   We discussed her diagnosis, prognosis, GOC, EOL wishes disposition and options.  A detailed discussion was had today regarding advanced directives.  Concepts specific to code status, artifical feeding and hydration, IV antibiotics and rehospitalization were discussed.  The difference between an aggressive medical intervention path and a comfort care path was discussed.  Values and goals of care important to patient and family were attempted to be elicited.  Discussed limitations of medical interventions to prolong quality of life in some situations and discussed the concept of human mortality.  The children state their mother does not have a good QOL. They discuss in great detail her previous and current facility. They discuss her hospitalizations, and begging to go home. Children state she coughed this morning with trying to drink coffee. They state she has been taking bites and sips. Family would like hospice facility placement, and for her to be kept clean, calm and comfortable. Spoke with Shanon Brow via phone and he states he is in agreement with this plan.   I completed a MOST form today and the signed original was placed in the chart. A photocopy was placed in the chart to be scanned into EMR. The patient outlined their wishes for the following treatment  decisions:  Cardiopulmonary Resuscitation: Do Not Attempt Resuscitation (DNR/No CPR)  Medical Interventions: Comfort Measures: Keep clean, warm, and dry. Use medication by any route, positioning, wound care, and other measures to relieve pain and suffering. Use oxygen, suction and manual treatment of airway obstruction as needed for comfort. Do not transfer to the hospital unless comfort needs cannot be met in current location.  Antibiotics: No antibiotics (use other measures to relieve symptoms)  IV Fluids: No IV fluids (provide other measures to ensure comfort)  Feeding Tube: No feeding tube       Length of Stay: 1  Current Medications: Scheduled Meds:  . amLODipine  10 mg Oral Daily  . citalopram  20 mg Oral Daily  . feeding supplement (ENSURE ENLIVE)  237 mL Oral BID BM  . heparin  5,000 Units Subcutaneous Q8H  . metoprolol tartrate  50 mg Oral BID  . sodium bicarbonate  650 mg Oral TID    Continuous Infusions:   PRN Meds: acetaminophen, HYDROcodone-acetaminophen  Physical Exam Pulmonary:     Effort: Pulmonary effort is normal.  Neurological:     Mental Status: She is alert.             Vital Signs: BP (!) 135/59 (BP Location: Left Arm)   Pulse 77   Temp 98.3 F (36.8 C) (Oral)   Resp 18   Ht '5\' 1"'  (1.549 m)   Wt 80 kg   SpO2 98%   BMI 33.32 kg/m  SpO2: SpO2: 98 % O2 Device: O2 Device: Room Air O2 Flow Rate:    Intake/output summary:   Intake/Output Summary (Last 24 hours) at 12/16/2019 1515 Last data filed at 12/16/2019 0900 Gross per 24 hour  Intake 120 ml  Output 250 ml  Net -130 ml   LBM: Last BM Date:  (unknown) Baseline Weight: Weight: 80 kg Most recent weight: Weight: 80 kg       Palliative Assessment/Data: 20%      Patient Active Problem List   Diagnosis Date Noted  . Falls frequently 12/15/2019  . Fall 12/14/2019  . Pressure injury of skin 11/12/2019  . Dementia ([place]) 12/29/2017  . ARF (acute renal failure) ([place]) 12/23/2017  .  Macular degeneration 10/06/2017  . Moderate single current episode of major depressive disorder ([place]) 10/06/2017  . Murmur, heart 09/02/2016  . Pedal edema 11/20/2015  . Dilated cardiomyopathy ([place]) 08/28/2015  . Primary osteoarthritis of both knees 01/27/2015  . Diabetes mellitus type II, controlled, with no complications ([place]) 91/69/4503  . Low TSH level 01/02/2015  . HTN (hypertension) 04/04/2014    Palliative Care Assessment & Plan   Recommendations/Plan: Shift to comfort care. Hospice facility recommended.     Code Status:    Code Status Orders  (From admission, onward)         Start     Ordered   12/15/19 0734  Do not attempt resuscitation (DNR)  Continuous       Question Answer Comment  In the event of cardiac or respiratory ARREST Do not call a "code blue"   In the event of cardiac or respiratory ARREST Do not perform Intubation, CPR, defibrillation or ACLS   In the event of cardiac or respiratory ARREST Use medication by any route, position, wound care, and other measures to relive pain and suffering. May use oxygen, suction and manual treatment of airway obstruction as needed for comfort.      12/15/19 8882        Code Status History    Date Active Date Inactive Code Status Order ID Comments User Context   12/23/2017 0151 12/25/2017 2043 Full Code [id number]  Amelia Jo, MD Inpatient   Advance Care Planning Activity    Advance Directive Documentation     Most Recent Value  Type of Advance Directive Out of facility DNR (pink MOST or yellow form)  Pre-existing out of facility DNR order (yellow form or pink MOST form) Pink Most/Yellow Form available - Physician notified to receive inpatient order  "MOST" Form in Place? --       Prognosis:   < 2 weeks Multiple recent hospitalizations. Recent UTI and PNA. Coughing  with oral intake. Minimal PO intake.    Thank you for allowing the Palliative Medicine Team to assist in the care of this patient.   Time In:  1:35 Time Out: 3:15 Total Time 1 hour 40 min Prolonged Time Billed  yes      Greater than 50%  of this time was spent counseling and coordinating care related to the above assessment and plan.  Asencion Gowda, NP  Please contact Palliative Medicine Team phone at [phone] for questions and concerns.

## 2019-12-16 NOTE — Progress Notes (Signed)
Manufacturing engineer hospital liaison note:  New referral for [facility] hospice home received from Palliative NP Asencion Gowda, TOC Jamesetta [place] made aware.  Patient information given to referral. Hospice eligibility pending.  Writer met with patient's son [name], daughter in law Margaretha Sheffield and daughter [name] to initiate education regarding hs\ospice services, philosophy, team approach to care and current visitation policy with understanding voiced.  Unfortunately [facility] is unable to offer a bed today. Family and TOC Doran Clay aware. Hospital Liaison team will follow and update daily regarding bed availability.  Thank you for the opportunity to be involved in the care of this patient and her family.  Flo Shanks BSN, RN, [employer] [phone]

## 2019-12-16 NOTE — TOC Initial Note (Signed)
Transition of Care [facility]) - Initial/Assessment Note    Patient Details  Name: Carolyn Hernandez MRN: [id number] Date of Birth: 24-Aug-1929  [facility] CM/SW Contact:    Shelbie Hutching, RN Phone Number: 12/16/2019, 10:29 AM  Clinical Narrative:                 Patient admitted to the hospital after falling at [facility].  Patient was discharged from this hospital on 6/7 to [facility] for short term skilled rehab.  Before going to [facility] patient lived at [place].  Plan will be for patient to return to [facility] at discharge.  Message has been left for [name] and [name] to return call to this RNCM.    Expected Discharge Plan: [place] Barriers to Discharge: Continued Medical Work up   Patient Goals and CMS Choice        Expected Discharge Plan and Services Expected Discharge Plan: [place]   Discharge Planning Services: CM Consult Post Acute Care Choice: Resumption of Svcs/PTA Provider, [place] Living arrangements for the past 2 months: [place] (at [place] before [facility] this month)                                      Prior Living Arrangements/Services Living arrangements for the past 2 months: [place] (at [place] before [facility] this month) Lives with:: Facility Resident Patient language and need for interpreter reviewed:: Yes Do you feel safe going back to the place where you live?: Yes      Need for Family Participation in Patient Care: Yes (Comment) (demenita) Care giver support system in place?: Yes (comment) (son and daughter)   Criminal Activity/Legal Involvement Pertinent to Current Situation/Hospitalization: No - Comment as needed  Activities of Daily Living Home Assistive Devices/Equipment: Hospital bed, Walker (specify type), Wheelchair ADL Screening (condition at time of admission) Patient's cognitive ability  adequate to safely complete daily activities?: No Is the patient deaf or have difficulty hearing?: No Does the patient have difficulty seeing, even when wearing glasses/contacts?: No Does the patient have difficulty concentrating, remembering, or making decisions?: Yes Patient able to express need for assistance with ADLs?: No Does the patient have difficulty dressing or bathing?: Yes Independently performs ADLs?: No Does the patient have difficulty walking or climbing stairs?: Yes Weakness of Legs: Both Weakness of Arms/Hands: Both  Permission Sought/Granted Permission sought to share information with : Case Manager, Customer service manager, Family Supports Permission granted to share information with : Yes, Verbal Permission Granted  Share Information with NAME: Merilyn Baba  Permission granted to share info w AGENCY: [place] granted to share info w Relationship: son and daughter     Emotional Assessment Appearance:: Appears stated age Attitude/Demeanor/Rapport: Unable to Assess Affect (typically observed): Pleasant Orientation: : Oriented to Self Alcohol / Substance Use: Not Applicable Psych Involvement: No (comment)  Admission diagnosis:  Weakness [R53.1] Fall [W19.XXXA] Cystitis [N30.90] Fall, initial encounter [W19.XXXA] Laceration of forehead, initial encounter [S01.81XA] Failure of outpatient treatment [Z78.9] Falls frequently [R29.6] Patient Active Problem List   Diagnosis Date Noted  . Falls frequently 12/15/2019  . Fall 12/14/2019  . Pressure injury of skin 11/12/2019  . Dementia ([place]) 12/29/2017  . ARF (acute renal failure) ([place]) 12/23/2017  . Macular degeneration 10/06/2017  . Moderate single current episode of  major depressive disorder ([place]) 10/06/2017  . Murmur, heart 09/02/2016  . Pedal edema 11/20/2015  . Dilated cardiomyopathy ([place]) 08/28/2015  . Primary osteoarthritis of both knees 01/27/2015  . Diabetes mellitus type II,  controlled, with no complications ([place]) 21/97/5883  . Low TSH level 01/02/2015  . HTN (hypertension) 04/04/2014   PCP:  Verl Bangs, FNP Pharmacy:   [facility] [address], [place] [address] Phone: [phone] Fax: [place], [place] [address] [address] Phone: [phone] Fax: [phone]  [facility] - [place], [place] - [name] [address] Phone: [phone] Fax: [phone]     Social Determinants of Health (SDOH) Interventions    Readmission Risk Interventions No flowsheet data found.

## 2019-12-16 NOTE — TOC Progression Note (Signed)
Transition of Care [facility]) - Progression Note    Patient Details  Name: Carolyn Hernandez MRN: [id number] Date of Birth: 1930/02/05  [facility]) CM/SW Contact  Shelbie Hutching, RN Phone Number: 12/16/2019, 4:57 PM  Clinical Narrative:    Family met with palliative care nurse practitioner today to discuss goals of care.  Family has decided that they would like to pursue residential hospice.  Residential hospice referral given to Flo Shanks with [facility] by Palliative NP.  There is not a hospice bed available today.    Expected Discharge Plan: [place] Barriers to Discharge: Hospice Bed not available  Expected Discharge Plan and Services Expected Discharge Plan: [place]   Discharge Planning Services: CM Consult Post Acute Care Choice: Resumption of Svcs/PTA Provider, [place] Living arrangements for the past 2 months: [place] (at [place] before [facility] this month)                                       Social Determinants of Health (SDOH) Interventions    Readmission Risk Interventions No flowsheet data found.

## 2019-12-17 DIAGNOSIS — I10 Essential (primary) hypertension: Secondary | ICD-10-CM

## 2019-12-17 DIAGNOSIS — I480 Paroxysmal atrial fibrillation: Secondary | ICD-10-CM

## 2019-12-17 DIAGNOSIS — G20 Parkinson's disease: Secondary | ICD-10-CM

## 2019-12-17 DIAGNOSIS — J440 Chronic obstructive pulmonary disease with acute lower respiratory infection: Secondary | ICD-10-CM

## 2019-12-17 DIAGNOSIS — J209 Acute bronchitis, unspecified: Secondary | ICD-10-CM

## 2019-12-17 NOTE — Progress Notes (Signed)
[facility] hospital liaison note:  Follow up visit made to new referral for [facility] hospice home. Patient appeared to be sleeping, pillow adjusted, patient did not awaken. Per chart review patient did not take her scheduled oral medications and has not had any PRN medications overnight. Daughter in law [place] in during visit. Writer did advise her that at this time Carolyn [facility] is unable to offer a bed. Hospital care team also made aware.  The weekend staff at the hospice home will contact the hospital [facility] if a bed becomes available for transfer. Thank you. Flo Shanks BSN, RN, [place] [phone]

## 2019-12-17 NOTE — Progress Notes (Signed)
PROGRESS NOTE  [name] ALP:[id number] DOB: October 01, 1929 DOA: 12/14/2019 PCP: Verl Bangs, FNP  Brief History   Carolyn Hernandez is a 86 y.o. female with medical history significant of advanced dementia, frequent falls, HTN who presented from SNF after an unwitnessed fall.  Pt has advanced dementia, so HPI obtained from her daughter at bedside.  Pt has had frequent falls, and about 6 weeks ago, pt stopped being able to walk and had moved from assisted living to SNF.  Pt had most likely hit her head during those falls because pt had bruising to her face.  Pt has had multiple ED visits after these falls (3 in the past month), and was dx with UTI during one of those visits.  Pt has had no urinary symptoms, dysuria, abdominal pain or fevers, per daughter.  Daughter didn't know whether pt was having normal PO intake or hydration at the SNF because pt couldn't remember whether she had had a meal or not.  Daughter said pt "sun downs" and becomes more confused and agitated at night.  Pt herself did complain of pain of various places at various times.  According to medical records, pt had a course of Keflex prescribed on 5/28 and Levaquin prescribed on 6/25.  Last urine cx on 6/16 (obtained during ED visit) grew pseudomonas, but abx was not started at that time due to no signs of UTI.  In the ED the patient was afebrile, pulse 98, BP 159/81, sating 100% on room air.  Labs notable for WBC 18.6, Cr 1.99, lactic acid 1.1, UA showed neg nitrite/large leuk/many bacteria, CXR no acute finding, pan skeletal xrays showed no acute fractures.  Pt was admitted to a telemetry bed.   Palliative care was consulted. They met with the family on the afternoon of 12/16/2019. They decided to make the patient comfort care only and to seek a residential hospice bed for discharge. Comfort care measures were initiated.  Consultants   Palliative care  Procedures   None  Antibiotics   Anti-infectives  (From admission, onward)   Start     Dose/Rate Route Frequency Ordered Stop   12/14/19 1600  ceFEPIme (MAXIPIME) 2 g in sodium chloride 0.9 % 100 mL IVPB  Status:  Discontinued        2 g 200 mL/hr over 30 Minutes Intravenous  Once 12/14/19 1549 12/14/19 1743      Subjective  The patient is resting comfortably. She appears comfortably.  Objective   Vitals:  Vitals:   12/16/19 1950 12/17/19 0745  BP: 131/69 137/62  Pulse: 82 79  Resp: 19 16  Temp:  (!) 97.4 F (36.3 C)  SpO2: 99% 97%   Exam:  Constitutional:   The patient is moribund. She is in no acute distress. Respiratory:   No increased work of breathing.  No wheezes, rales, or rhonchi  No tactile fremitus Cardiovascular:   Regular rate and rhythm  No murmurs, ectopy, or gallups.  No lateral PMI. No thrills. Abdomen:   Abdomen is soft, non-tender, non-distended  No hernias, masses, or organomegaly  Normoactive bowel sounds.  Musculoskeletal:   No cyanosis, clubbing, or edema Skin:   No rashes, lesions, ulcers  palpation of skin: no induration or nodules Neurologic:  Unable to evaluate due to the patient's inability to cooperate with exam. Psychiatric:  Unable to evaluate due to the patient's inability to cooperate with exam.  I have personally reviewed the following:   Today's Data   Vital  Micro  Data   Blood culture x 2  Urine culture (12/01/2019)  Urine culture (12/14/2019)  Imaging   CT head  CT C-spine  Scheduled Meds:  amLODipine  10 mg Oral Daily   citalopram  20 mg Oral Daily   feeding supplement (ENSURE ENLIVE)  237 mL Oral BID BM   metoprolol tartrate  50 mg Oral BID   sodium bicarbonate  650 mg Oral TID   Continuous Infusions:  Active Problems:   Fall   Falls frequently   LOS: 2 days   A & P  Debility with recurrent falls: Pt is now non-ambulatory at baseline. Due to her dementia, she continues to try to get out of bed although this is unsafe. She is now  comfort care only. Residential hospice bed is being sought.  Leukocytosis: Likely stress reaction. No obvious source of infection. Monitor for fever. Procalcitonin negative. WBC down somewhat due to hydration. Comfort care only.  Urinary incontinence: Related to dementia. Noted. Urine culture positive on 12/01/2019, but this likely reflects colonization. Comfort care only.  Advanced dementia: Pt sundowns. Redirect as possible.  Haldol is available for agitation. Palliative care is consulted. Comfort care only..  CKD IV: Creatinine is 1.99 on admission. The patient is receiving IV fluids. Comfort care only.  Hypertension: Comfort care only.  I have seen and examined this patient myself. I have spent 30 minutes in his evaluation and care.  DVT Prophylaxis: Heparin CODE STATUS: DNR Family Communication: None available Disposition: The patient is from SNF. Anticipate discharge to residential  Hospice when a bed is available. Barriers to discharge: Residential hospice bed.  Status is: Inpatient Remains inpatient appropriate because:Inpatient level of care appropriate due to severity of illness   Dispo: The patient is from: SNF              Anticipated d/c is to: Residential Hospice              Anticipated d/c date is: 2 days              Patient currently is not medically stable to d/c. No safe discharge.  Carolyn Madril, DO [facility] Direct contact: see [email or web address]  7PM-7AM contact night coverage as above 12/17/2019, 2:48 PM  LOS: 0 days

## 2019-12-17 NOTE — Care Management Important Message (Signed)
Important Message  Patient Details  Name: Carolyn Hernandez MRN: [id number] Date of Birth: Oct 18, 1929   Medicare Important Message Given:  N/A - LOS <3 / Initial given by admissions     [name] 12/17/2019, 8:21 AM

## 2019-12-18 LAB — URINE CULTURE: Culture: >=100000 — AB

## 2019-12-18 MED ORDER — NYSTATIN 100000 UNIT/ML MT SUSP
5.0000 mL | Freq: Four times a day (QID) | OROMUCOSAL | Status: DC
  Administered 2019-12-18 – 2019-12-19 (×4): 500000 [IU] via ORAL
  Filled 2019-12-18 (×4): qty 5

## 2019-12-18 NOTE — Progress Notes (Signed)
PROGRESS NOTE  Carolyn Hernandez QMV:[id number] DOB: Jun 01, 1930 DOA: 12/14/2019 PCP: Verl Bangs, FNP  Brief History   Carolyn Hernandez is a 86 y.o. female with medical history significant of advanced dementia, frequent falls, HTN who presented from SNF after an unwitnessed fall.  Pt has advanced dementia, so HPI obtained from her daughter at bedside.  Pt has had frequent falls, and about 6 weeks ago, pt stopped being able to walk and had moved from assisted living to SNF.  Pt had most likely hit her head during those falls because pt had bruising to her face.  Pt has had multiple ED visits after these falls (3 in the past month), and was dx with UTI during one of those visits.  Pt has had no urinary symptoms, dysuria, abdominal pain or fevers, per daughter.  Daughter didn't know whether pt was having normal PO intake or hydration at the SNF because pt couldn't remember whether she had had a meal or not.  Daughter said pt "sun downs" and becomes more confused and agitated at night.  Pt herself did complain of pain of various places at various times.  According to medical records, pt had a course of Keflex prescribed on 5/28 and Levaquin prescribed on 6/25.  Last urine cx on 6/16 (obtained during ED visit) grew pseudomonas, but abx was not started at that time due to no signs of UTI.  In the ED the patient was afebrile, pulse 98, BP 159/81, sating 100% on room air.  Labs notable for WBC 18.6, Cr 1.99, lactic acid 1.1, UA showed neg nitrite/large leuk/many bacteria, CXR no acute finding, pan skeletal xrays showed no acute fractures.  Pt was admitted to a telemetry bed.   Palliative care was consulted. They met with the family on the afternoon of 12/16/2019. They decided to make the patient comfort care only and to seek a residential hospice bed for discharge. Comfort care measures were initiated.  Consultants  . Palliative care  Procedures  . None  Antibiotics   Anti-infectives  (From admission, onward)   Start     Dose/Rate Route Frequency Ordered Stop   12/14/19 1600  ceFEPIme (MAXIPIME) 2 g in sodium chloride 0.9 % 100 mL IVPB  Status:  Discontinued        2 g 200 mL/hr over 30 Minutes Intravenous  Once 12/14/19 1549 12/14/19 1743      Subjective  The patient is resting comfortably. She is complaining of pain in her buttocks. Nursing alerted.  Objective   Vitals:  Vitals:   12/17/19 2015 12/18/19 0809  BP: (!) 156/119 (!) 151/73  Pulse: 92 91  Resp: 15 18  Temp: 98.7 F (37.1 C) (!) 97.5 F (36.4 C)  SpO2: 99% 98%   Exam:  Constitutional:  . The patient is somnolent, but rouseable. She is in mild distress from pain in her buttocks. Respiratory:  . No increased work of breathing. . No wheezes, rales, or rhonchi . No tactile fremitus Cardiovascular:  . Regular rate and rhythm . No murmurs, ectopy, or gallups. . No lateral PMI. No thrills. Abdomen:  . Abdomen is soft, non-tender, non-distended . No hernias, masses, or organomegaly . Normoactive bowel sounds.  Musculoskeletal:  . No cyanosis, clubbing, or edema Skin:  . No rashes, lesions, ulcers . palpation of skin: no induration or nodules Neurologic:  Unable to evaluate due to the patient's inability to cooperate with exam. Psychiatric:  Unable to evaluate due to the patient's inability to cooperate  with exam.  I have personally reviewed the following:   Today's Data  . Orthoptist  . Blood culture x 2 . Urine culture (12/01/2019) . Urine culture (12/14/2019)  Imaging  . CT head . CT C-spine  Scheduled Meds: . amLODipine  10 mg Oral Daily  . citalopram  20 mg Oral Daily  . feeding supplement (ENSURE ENLIVE)  237 mL Oral BID BM  . metoprolol tartrate  50 mg Oral BID  . sodium bicarbonate  650 mg Oral TID   Continuous Infusions:  Active Problems:   Fall   Falls frequently   LOS: 3 days   A & P  Debility with recurrent falls: Pt is now non-ambulatory at  baseline. Due to her dementia, she continues to try to get out of bed although this is unsafe. She is now comfort care only. Residential hospice bed is being sought.  Leukocytosis: Likely stress reaction. No obvious source of infection. Monitor for fever. Procalcitonin negative. WBC down somewhat due to hydration. Comfort care only.  Urinary incontinence: Related to dementia. Noted. Urine culture positive on 12/01/2019, but this likely reflects colonization. Comfort care only.  Advanced dementia: Pt sundowns. Redirect as possible.  Haldol is available for agitation. Palliative care is consulted. Comfort care only..  CKD IV: Creatinine is 1.99 on admission. The patient is receiving IV fluids. Comfort care only.  Hypertension: Comfort care only.  I have seen and examined this patient myself. I have spent 30 minutes in his evaluation and care.  DVT Prophylaxis: Heparin CODE STATUS: DNR Family Communication: None available Disposition: The patient is from SNF. Anticipate discharge to residential  Hospice when a bed is available. Barriers to discharge: Residential hospice bed.  Status is: Inpatient Remains inpatient appropriate because:Inpatient level of care appropriate due to severity of illness   Dispo: The patient is from: SNF              Anticipated d/c is to: Residential Hospice              Anticipated d/c date is: 2 days              Patient currently is not medically stable to d/c. No safe discharge.  Najia Hurlbutt, DO [facility] Direct contact: see [email or web address]  7PM-7AM contact night coverage as above 12/18/2019, 11:34 PM  LOS: 0 days

## 2019-12-19 DIAGNOSIS — J449 Chronic obstructive pulmonary disease, unspecified: Secondary | ICD-10-CM

## 2019-12-19 DIAGNOSIS — F0390 Unspecified dementia without behavioral disturbance: Secondary | ICD-10-CM

## 2019-12-19 DIAGNOSIS — K649 Unspecified hemorrhoids: Secondary | ICD-10-CM

## 2019-12-19 DIAGNOSIS — B370 Candidal stomatitis: Secondary | ICD-10-CM

## 2019-12-19 LAB — CULTURE, BLOOD (ROUTINE X 2)
Culture: NO GROWTH
Culture: NO GROWTH
Special Requests: ADEQUATE
Special Requests: ADEQUATE

## 2019-12-19 MED ORDER — DOCUSATE SODIUM 100 MG PO CAPS
100.0000 mg | ORAL_CAPSULE | Freq: Every day | ORAL | Status: DC
  Administered 2019-12-19: 100 mg via ORAL
  Filled 2019-12-19: qty 1

## 2019-12-19 MED ORDER — MAGIC MOUTHWASH: 5.0000 mL | Freq: Three times a day (TID) | ORAL | 0 refills | Status: AC

## 2019-12-19 MED ORDER — DOCUSATE SODIUM 100 MG PO CAPS: 100.0000 mg | ORAL_CAPSULE | Freq: Every day | ORAL | 0 refills | Status: AC

## 2019-12-19 MED ORDER — MORPHINE SULFATE (CONCENTRATE) 20 MG/ML PO SOLN: 5.0000 mg | ORAL | 0 refills | Status: AC | PRN

## 2019-12-19 MED ORDER — BIOTENE DRY MOUTH MT LIQD: 15.0000 mL | OROMUCOSAL | 0 refills | Status: AC | PRN

## 2019-12-19 MED ORDER — HYDROCORTISONE ACETATE 25 MG RE SUPP
25.0000 mg | Freq: Two times a day (BID) | RECTAL | Status: DC
  Administered 2019-12-19: 25 mg via RECTAL
  Filled 2019-12-19 (×2): qty 1

## 2019-12-19 MED ORDER — ENSURE ENLIVE PO LIQD: 237.0000 mL | Freq: Two times a day (BID) | ORAL | 12 refills | Status: AC

## 2019-12-19 MED ORDER — LIDOCAINE VISCOUS HCL 2 % MT SOLN
5.0000 mL | Freq: Three times a day (TID) | OROMUCOSAL | Status: DC
  Administered 2019-12-19: 5 mL via OROMUCOSAL
  Filled 2019-12-19 (×3): qty 15

## 2019-12-19 MED ORDER — GLYCOPYRROLATE 1 MG PO TABS: 1.0000 mg | ORAL_TABLET | ORAL | 0 refills | Status: AC | PRN

## 2019-12-19 MED ORDER — NYSTATIN 100000 UNIT/ML MT SUSP: 5.0000 mL | Freq: Four times a day (QID) | OROMUCOSAL | 0 refills | Status: AC

## 2019-12-19 MED ORDER — LORAZEPAM 2 MG/ML PO CONC: 1.0000 mg | ORAL | 0 refills | Status: AC | PRN

## 2019-12-19 MED ORDER — LIDOCAINE VISCOUS HCL 2 % MT SOLN: 5.0000 mL | Freq: Three times a day (TID) | OROMUCOSAL | 0 refills | Status: AC

## 2019-12-19 MED ORDER — MAGIC MOUTHWASH
5.0000 mL | Freq: Three times a day (TID) | ORAL | Status: DC
  Administered 2019-12-19: 5 mL via ORAL
  Filled 2019-12-19: qty 5

## 2019-12-19 MED ORDER — HALOPERIDOL 0.5 MG PO TABS: 0.5000 mg | ORAL_TABLET | ORAL | 0 refills | Status: AC | PRN

## 2019-12-19 MED ORDER — MAGIC MOUTHWASH W/LIDOCAINE: 5.0000 mL | Freq: Three times a day (TID) | ORAL | Status: DC

## 2019-12-19 MED ORDER — HYDROCORTISONE ACETATE 25 MG RE SUPP: 25.0000 mg | Freq: Two times a day (BID) | RECTAL | 0 refills | Status: AC

## 2019-12-19 MED ORDER — POLYVINYL ALCOHOL 1.4 % OP SOLN: 1.0000 [drp] | Freq: Four times a day (QID) | OPHTHALMIC | 0 refills | Status: AC | PRN

## 2019-12-19 MED ORDER — ONDANSETRON 4 MG PO TBDP: 4.0000 mg | ORAL_TABLET | Freq: Four times a day (QID) | ORAL | 0 refills | Status: AC | PRN

## 2019-12-19 NOTE — TOC Progression Note (Signed)
Transition of Care [facility]) - Progression Note    Patient Details  Name: Carolyn Hernandez MRN: [id number] Date of Birth: [birth year]  Transition of Care [facility]) CM/SW [place], LCSW Phone Number: 12/19/2019, 11:43 AM  Clinical Narrative:     No Hospice Home bed at this time  Expected Discharge Plan: [place] Barriers to Discharge: Hospice Bed not available  Expected Discharge Plan and Services Expected Discharge Plan: [place]   Discharge Planning Services: CM Consult Post Acute Care Choice: Resumption of Svcs/PTA Provider, [place] arrangements for the past 2 months: [place] (at [place] before [employer] this month)                                       Social Determinants of Health (SDOH) Interventions    Readmission Risk Interventions No flowsheet data found.

## 2019-12-19 NOTE — Discharge Summary (Signed)
Physician Discharge Summary  Carolyn Hernandez:[id number] DOB: [birth year] DOA: 12/14/2019  PCP: Verl Bangs, FNP  Admit date: 12/14/2019 Discharge date: 12/19/2019  Recommendations for Outpatient Follow-up:  1. End of life care  Discharge Diagnoses: Principal diagnosis is #1 2. Advanced dementia 3. CKD IV 4. Hypertension 1. Debility with recurrent falls. 2. Hemorrhoids 3. Thrush 4. Leukocytosis 5. Urinary incontinence  Discharge Condition: Fair  Disposition: Residential Hospice  Diet recommendation: As tolerated  Filed Weights   12/14/19 1241  Weight: 80 kg    History of present illness:  Carolyn Hernandez is a 86 y.o. female with medical history significant of advanced dementia, frequent falls, HTN who presented from SNF after an unwitnessed fall.  Pt has advanced dementia, so HPI obtained from her daughter at bedside.  Pt has had frequent falls, and about 6 weeks ago, pt stopped being able to walk and had moved from assisted living to SNF.  Pt had most likely hit her head during those falls because pt had bruising to her face.  Pt has had multiple ED visits after these falls (3 in the past month), and was dx with UTI during one of those visits.  Pt has had no urinary symptoms, dysuria, abdominal pain or fevers, per daughter.  Daughter didn't know whether pt was having normal PO intake or hydration at the SNF because pt couldn't remember whether she had had a meal or not.  Daughter said pt "sun downs" and becomes more confused and agitated at night.  Pt herself did complain of pain of various places at various times.  According to medical records, pt had a course of Keflex prescribed on 5/28 and Levaquin prescribed on 6/25.  Last urine cx on 6/16 (obtained during ED visit) grew pseudomonas, but abx was not started at that time due to no signs of UTI.   ED Course: initial vitals: afebrile, pulse 98, BP 159/81, sating 100% on room air.  Labs notable for WBC  18.6, Cr 1.99, lactic acid 1.1, UA showed neg nitrite/large leuk/many bacteria, CXR no acute finding, pan skeletal xrays showed no acute fractures.  Pt was admitted to a telemetry bed.  Hospital Course: Palliative care was consulted. They met with the family on the afternoon of 12/16/2019. They decided to make the patient comfort care only and to seek a residential hospice bed for discharge. Comfort care measures were initiated. Pending discharge to residential hospice facility.  A bed has become available, and she is being discharge.  Today's assessment: For physical exam on the day of discharge, please see progress note from 12/19/2019. O: Vitals:  Vitals:   12/19/19 0008 12/19/19 1218  BP: 127/72 (!) 155/58  Pulse: 90 (!) 111  Resp: 14 18  Temp: 98.3 F (36.8 C) 98.3 F (36.8 C)  SpO2: 97% 99%   Discharge Instructions  Discharge Instructions    Activity as tolerated - No restrictions   Complete by: As directed    Call MD for:  severe uncontrolled pain   Complete by: As directed    Diet - low sodium heart healthy   Complete by: As directed    Discharge instructions   Complete by: As directed    End of life care.   Discharge wound care:   Complete by: As directed    None   Increase activity slowly   Complete by: As directed      Allergies as of 12/19/2019   No Known Allergies     Medication List  STOP taking these medications   amLODipine 10 MG tablet Commonly known as: NORVASC   citalopram 20 MG tablet Commonly known as: CELEXA   collagenase ointment Commonly known as: SANTYL   diclofenac sodium 1 % Gel Commonly known as: VOLTAREN   HYDROcodone-acetaminophen 5-325 MG tablet Commonly known as: NORCO/VICODIN   hydrOXYzine 25 MG tablet Commonly known as: ATARAX/VISTARIL   levofloxacin 750 MG tablet Commonly known as: LEVAQUIN   metoprolol tartrate 50 MG tablet Commonly known as: LOPRESSOR   sodium bicarbonate 650 MG tablet   traMADol 50 MG  tablet Commonly known as: ULTRAM   traZODone 50 MG tablet Commonly known as: DESYREL     TAKE these medications   acetaminophen 325 MG tablet Commonly known as: TYLENOL Take 650 mg by mouth 3 (three) times daily.   antiseptic oral rinse Liqd Apply 15 mLs topically as needed for dry mouth.   docusate sodium 100 MG capsule Commonly known as: COLACE Take 1 capsule (100 mg total) by mouth daily. Start taking on: December 20, 2019   feeding supplement (ENSURE ENLIVE) Liqd Take 237 mLs by mouth 2 (two) times daily between meals. Start taking on: December 20, 2019   glycopyrrolate 1 MG tablet Commonly known as: ROBINUL Take 1 tablet (1 mg total) by mouth every 4 (four) hours as needed (excessive secretions).   haloperidol 0.5 MG tablet Commonly known as: HALDOL Take 1 tablet (0.5 mg total) by mouth every 4 (four) hours as needed for agitation (or delirium).   hydrocortisone 25 MG suppository Commonly known as: ANUSOL-HC Place 1 suppository (25 mg total) rectally 2 (two) times daily.   lidocaine 2 % solution Commonly known as: XYLOCAINE Use as directed 5 mLs in the mouth or throat 3 (three) times daily before meals.   LORazepam 2 MG/ML concentrated solution Commonly known as: ATIVAN Place 0.5 mLs (1 mg total) under the tongue every 4 (four) hours as needed for anxiety.   magic mouthwash Soln Take 5 mLs by mouth 3 (three) times daily before meals.   morphine 20 MG/ML concentrated solution Commonly known as: ROXANOL Take 0.25-0.5 mLs (5-10 mg total) by mouth every 2 (two) hours as needed for severe pain.   nystatin 100000 UNIT/ML suspension Commonly known as: MYCOSTATIN Take 5 mLs (500,000 Units total) by mouth 4 (four) times daily.   ondansetron 4 MG disintegrating tablet Commonly known as: ZOFRAN-ODT Take 1 tablet (4 mg total) by mouth every 6 (six) hours as needed for nausea.   polyvinyl alcohol 1.4 % ophthalmic solution Commonly known as: LIQUIFILM TEARS Place 1 drop into  both eyes 4 (four) times daily as needed for dry eyes.            Discharge Care Instructions  (From admission, onward)         Start     Ordered   12/19/19 0000  Discharge wound care:       Comments: None   12/19/19 1617         No Known Allergies  The results of significant diagnostics from this hospitalization (including imaging, microbiology, ancillary and laboratory) are listed below for reference.    Significant Diagnostic Studies: DG Chest 1 View  Result Date: 12/14/2019 CLINICAL DATA:  Fall. EXAM: CHEST  1 VIEW COMPARISON:  December 22, 2017. FINDINGS: Stable cardiomegaly. No pneumothorax or pleural effusion is noted. Both lungs are clear. The visualized skeletal structures are unremarkable. IMPRESSION: No active disease. Electronically Signed   By: Bobbe Medico.D.  On: 12/14/2019 14:01   DG Pelvis 1-2 Views  Result Date: 12/14/2019 CLINICAL DATA:  Fall. EXAM: PELVIS - 1-2 VIEW COMPARISON:  August 10, 2006. FINDINGS: Status post surgical internal fixation of old proximal left femoral fracture. Severe degenerative changes seen involving the right hip. No acute fracture or dislocation is noted. Vascular calcifications are noted. IMPRESSION: Severe degenerative joint disease of the right hip. No acute abnormality seen in the pelvis. Electronically Signed   By: James  Green Jr M.D.   On: 12/14/2019 14:00   DG Wrist Complete Left  Result Date: 12/01/2019 CLINICAL DATA:  Fall EXAM: LEFT WRIST - COMPLETE 3+ VIEW COMPARISON:  Concurrent hand radiographs, wrist radiographs 11/12/2019 FINDINGS: Subtle band of sclerosis extending across the radial metaphysis compatible with a minimally displaced impacted fracture though this finding was present on comparison radiographs from 11/12/2019. No other definite acute osseous injury is identified though evaluation may be limited by diffuse bony demineralization and advanced arthrosis. Comminuted fragments adjacent the tip of the ulnar  styloid likely reflect a combination of ossicle and remote ulnar styloid process fracture. Mild positive ulnar variance. Calcifications seen in the triangular fibrocartilage. Advanced degenerative changes noted throughout the carpal bones and at the radiocarpal articulation with more severe features at the first carpometacarpal and triscaphe joints. Diffuse soft tissue swelling of the wrist. No soft tissue gas or foreign body. Vascular calcium present in the soft tissues. IMPRESSION: 1. Subtle band of sclerosis extending across the radial metaphysis similar to comparison and may reflect a an incompletely healed injury or re-injury of the distal radial metaphysis. 2. No other definite acute osseous injury is identified though evaluation may be limited by diffuse bony demineralization and advanced arthrosis. 3. Additional degenerative changes in the wrist as above. Electronically Signed   By: Price  DeHay M.D.   On: 12/01/2019 20:06   DG Tibia/Fibula Left  Result Date: 12/14/2019 CLINICAL DATA:  Fall today. EXAM: LEFT TIBIA AND FIBULA - 2 VIEW COMPARISON:  None. FINDINGS: There is no evidence of fracture or other focal bone lesions. Soft tissues are unremarkable. IMPRESSION: Negative. Electronically Signed   By: James  Green Jr M.D.   On: 12/14/2019 13:58   CT Head Wo Contrast  Result Date: 12/14/2019 CLINICAL DATA:  Unwitnessed fall EXAM: CT HEAD WITHOUT CONTRAST CT CERVICAL SPINE WITHOUT CONTRAST TECHNIQUE: Multidetector CT imaging of the head and cervical spine was performed following the standard protocol without intravenous contrast. Multiplanar CT image reconstructions of the cervical spine were also generated. COMPARISON:  12/01/2019 FINDINGS: CT HEAD FINDINGS Brain: No evidence of acute infarction, hemorrhage, hydrocephalus, extra-axial collection or mass lesion/mass effect. Periventricular and deep white matter hypodensity. Vascular: No hyperdense vessel or unexpected calcification. Skull: Normal.  Negative for fracture or focal lesion. Sinuses/Orbits: No acute finding. Other: None. CT CERVICAL SPINE FINDINGS Alignment: Normal. Skull base and vertebrae: No acute fracture. No primary bone lesion or focal pathologic process. Soft tissues and spinal canal: No prevertebral fluid or swelling. No visible canal hematoma. Disc levels: Partial bony ankylosis of C5 through C7. Mild to moderate disc space height loss elsewhere. Upper chest: Negative. Other: Enlarged, heterogeneous thyroid. IMPRESSION: 1. No acute intracranial pathology. Small-vessel white matter disease. 2. No fracture or static subluxation of the cervical spine. Degenerative findings and ankylosis as above. 3. Enlarged, heterogeneous thyroid. Consider dedicated thyroid ultrasound if clinically appropriate given age and comorbidity. (Ref: J Am Coll Radiol. 2015 Feb;12(2): 143-50). Electronically Signed   By: Alex  Bibbey M.D.   On: 12/14/2019 13:28     CT Head Wo Contrast  Result Date: 12/01/2019 CLINICAL DATA:  Head trauma, fall, facial bruising EXAM: CT HEAD WITHOUT CONTRAST TECHNIQUE: Contiguous axial images were obtained from the base of the skull through the vertex without intravenous contrast. COMPARISON:  CT 11/12/2019 FINDINGS: Brain: No evidence of acute infarction, hemorrhage, hydrocephalus, extra-axial collection or mass lesion/mass effect. Symmetric prominence of the ventricles, cisterns and sulci compatible with parenchymal volume loss. Patchy areas of white matter hypoattenuation are most compatible with chronic microvascular angiopathy. Vascular: Atherosclerotic calcification of the carotid siphons and intradural vertebral arteries. No hyperdense vessel. Skull: Minimal residual thickening of the frontal scalp, likely related to prior injury no new scalp swelling or hematoma. No calvarial fracture or suspicious osseous lesion. Mild hyperostosis frontalis interna, unchanged from prior. Sinuses/Orbits: Minimal thickening in the ethmoids.  Paranasal sinuses and mastoids are otherwise predominantly clear. Prior left lens extraction. Orbital structures as included are otherwise unremarkable. Other: Edentulous. IMPRESSION: 1. No acute intracranial findings. 2. Chronic microvascular angiopathy and parenchymal volume loss. 3. Minimal residual thickening of the right frontal scalp, likely related to prior injury. No new scalp swelling or hematoma. Electronically Signed   By: Lovena Le M.D.   On: 12/01/2019 20:13   CT Cervical Spine Wo Contrast  Result Date: 12/14/2019 CLINICAL DATA:  Unwitnessed fall EXAM: CT HEAD WITHOUT CONTRAST CT CERVICAL SPINE WITHOUT CONTRAST TECHNIQUE: Multidetector CT imaging of the head and cervical spine was performed following the standard protocol without intravenous contrast. Multiplanar CT image reconstructions of the cervical spine were also generated. COMPARISON:  12/01/2019 FINDINGS: CT HEAD FINDINGS Brain: No evidence of acute infarction, hemorrhage, hydrocephalus, extra-axial collection or mass lesion/mass effect. Periventricular and deep white matter hypodensity. Vascular: No hyperdense vessel or unexpected calcification. Skull: Normal. Negative for fracture or focal lesion. Sinuses/Orbits: No acute finding. Other: None. CT CERVICAL SPINE FINDINGS Alignment: Normal. Skull base and vertebrae: No acute fracture. No primary bone lesion or focal pathologic process. Soft tissues and spinal canal: No prevertebral fluid or swelling. No visible canal hematoma. Disc levels: Partial bony ankylosis of C5 through C7. Mild to moderate disc space height loss elsewhere. Upper chest: Negative. Other: Enlarged, heterogeneous thyroid. IMPRESSION: 1. No acute intracranial pathology. Small-vessel white matter disease. 2. No fracture or static subluxation of the cervical spine. Degenerative findings and ankylosis as above. 3. Enlarged, heterogeneous thyroid. Consider dedicated thyroid ultrasound if clinically appropriate given age and  comorbidity. (Ref: J Am Coll Radiol. 2015 Feb;12(2): 143-50). Electronically Signed   By: Eddie Candle M.D.   On: 12/14/2019 13:28   CT Cervical Spine Wo Contrast  Result Date: 12/01/2019 CLINICAL DATA:  Fall, facial bruising EXAM: CT CERVICAL SPINE WITHOUT CONTRAST TECHNIQUE: Multidetector CT imaging of the cervical spine was performed without intravenous contrast. Multiplanar CT image reconstructions were also generated. COMPARISON:  CT 11/04/2019 FINDINGS: Alignment: Stabilization collar absent at the time of examination. Preservation of normal cervical lordosis. Mild anterolisthesis C3 on 4 is similar to the comparison CT and likely on a degenerative basis. No evidence of traumatic listhesis. No abnormally widened, perched or jumped facets. Normal alignment of the craniocervical and atlantoaxial articulations accounting for mild leftward cranial rotation. Skull base and vertebrae: No acute fracture. No suspicious osseous lesions. Advanced arthrosis at the atlantodental interval as well as an articulation between the tip of the dens and basion. Calcific pannus formation posterior to the dens. Bony fusion across C5-C7 levels, similar to comparison. Enthesopathic mineralization noted superficial to the spinous processes along the nuchal ligament. Soft tissues and spinal  canal: No pre or paravertebral fluid or swelling. No visible canal hematoma. Disc levels: Multilevel intervertebral disc height loss with spondylitic endplate changes. Posterior disc focal osteophyte complexes are again seen throughout the cervical spine most pronounced C4-5, C5-6 resulting in at least moderate canal stenosis. Additional ligamentum flavum hypertrophic changes are present in the upper cervical levels contributing some mild stenosis at the remaining cervical levels. Diffuse uncinate spurring and facet hypertrophic changes are present throughout the cervical spine. Resulting mild to moderate multilevel neural foraminal narrowing  with more focally severe foraminal narrowing at C3-4, C4-5. Upper chest: No acute abnormality in the upper chest or imaged lung apices. Other: Cervical carotid atherosclerosis. Redemonstration of the heterogeneous multinodular thyroid gland. IMPRESSION: 1. No acute fracture or traumatic listhesis of the cervical spine. 2. Bony fusion of the C5-C7 levels. 3. Advanced multilevel degenerative changes with maximal changes resulting in moderate canal stenosis at C4-5 and C5-6 and severe bilateral foraminal narrowing C3-4, C4-5. Additional changes as above. 4. Calcific pannus formation posterior to the dens, can be seen as sequela of rheumatoid or CPPD arthropathy. 5. Cervical carotid atherosclerosis. 6. Redemonstration of the heterogeneous multinodular thyroid gland. The decision for further evaluation should be based upon clinical assessment of the patient's age and comorbidities as well as desire to pursue further workup. This follows consensus guidelines: Managing Incidental Thyroid Nodules Detected on Imaging: White Paper of the ACR Incidental Thyroid Findings Committee. J Am Coll Radiol 2015; 12:143-150. and [employer] 3-tiered system for managing ITNs: J Am Coll Radiol. 2015; Feb;12(2): 143-50 Electronically Signed   By: Lovena Le M.D.   On: 12/01/2019 20:19   CT PELVIS WO CONTRAST  Result Date: 12/01/2019 CLINICAL DATA:  Fall 1 week ago and again today. EXAM: CT PELVIS WITHOUT CONTRAST TECHNIQUE: Multidetector CT imaging of the pelvis was performed following the standard protocol without intravenous contrast. COMPARISON:  Plain films today FINDINGS: Urinary Tract:  No abnormality visualized. Bowel:  Unremarkable visualized pelvic bowel loops. Vascular/Lymphatic: Aortoiliac atherosclerosis. No aneurysm or adenopathy. Reproductive:  Prior hysterectomy.  No adnexal masses. Other:  No free fluid or free air. Musculoskeletal: Remote posttraumatic and postsurgical changes in the proximal left femur. Advanced  osteoarthritis changes in the hips bilaterally, right greater than left with joint space narrowing, spurring and subchondral cyst formation. No acute fracture, subluxation or dislocation. IMPRESSION: No acute bony abnormality. Advanced osteoarthritis in the hips bilaterally. Aortoiliac atherosclerosis. Electronically Signed   By: Rolm Baptise M.D.   On: 12/01/2019 20:16   DG Hand 2 View Left  Result Date: 12/01/2019 CLINICAL DATA:  Fall, unwitnessed fall EXAM: LEFT HAND - 2 VIEW COMPARISON:  Concurrent wrist radiographs, hand wrist radiographs 11/12/2019 FINDINGS: The osseous structures appear diffusely demineralized which may limit detection of small or nondisplaced fractures. Band of sclerosis across the distal radial metaphysis could reflect incompletely healed fracture seen on comparison exam or re-injury. Finding is better detailed on concurrent wrist radiographs. No other acute osseous abnormality is seen in the hand. There is advanced degenerative arthrosis throughout the hand and wrist most prominently at the first carpometacarpal and triscaphe joints. Mild interphalangeal and metacarpophalangeal arthrosis is present as well. No worrisome osseous lesions. Pulse oximeter upon the tip of the second digit. IMPRESSION: 1. Diffuse osseous demineralization which may limit detection of small or nondisplaced fractures. 2. Band of sclerosis across the distal radial metaphysis could reflect incompletely healed fracture seen on comparison exam or re-injury. Finding is better detailed on concurrent wrist radiographs. 3. No other acute osseous injury  in the hand. 4. Extensive degenerative changes about the hand and wrist. Electronically Signed   By: Lovena Le M.D.   On: 12/01/2019 20:10   DG Shoulder Left  Result Date: 12/14/2019 CLINICAL DATA:  Pain following fall EXAM: LEFT SHOULDER - 2+ VIEW COMPARISON:  None. FINDINGS: Frontal, oblique, and Y scapular images were obtained. No acute fracture or dislocation.  Calcification along the lateral left humeral head is likely due to calcific tendinosis. There is mild narrowing of the glenohumeral and acromioclavicular joints. No erosive change. Visualized left lung clear. IMPRESSION: No appreciable acute fracture or dislocation. Calcification lateral to the left humeral head likely represents focal calcific tendinosis. Mild generalized joint space narrowing noted. Electronically Signed   By: Lowella Grip III M.D.   On: 12/14/2019 14:53   DG Hip Unilat W or Wo Pelvis 2-3 Views Right  Result Date: 12/01/2019 CLINICAL DATA:  Un witnessed fall today, right leg pain EXAM: DG HIP (WITH OR WITHOUT PELVIS) 2-3V RIGHT COMPARISON:  08/10/2006 FINDINGS: Frontal view of the pelvis as well as frontal and frogleg lateral views of the right hip are obtained. Postsurgical changes are seen within the proximal left femur from prior ORIF of a femoral neck fracture. Extensive heterotopic ossification surrounds the left hip. There is end-stage right hip osteoarthritis with axial joint space narrowing and circumferential osteophyte formation. Numerous subchondral cysts are noted. No acute displaced fracture. The remainder of the pelvis is unremarkable. Sacroiliac joints are normal. IMPRESSION: 1. No acute displaced fracture. 2. Severe right hip osteoarthritis. 3. Postsurgical changes left hip. Electronically Signed   By: Randa Ngo M.D.   On: 12/01/2019 20:03   DG Femur Min 2 Views Left  Result Date: 12/14/2019 CLINICAL DATA:  Status post fall. EXAM: LEFT FEMUR 2 VIEWS COMPARISON:  None. FINDINGS: Status post surgical internal fixation of old proximal left femoral fracture. Heterotopic bone formation is noted. No acute fracture or dislocation is noted. Vascular calcifications are noted. IMPRESSION: No acute abnormality seen in the left femur. Electronically Signed   By: Marijo Conception M.D.   On: 12/14/2019 13:57    Microbiology: Recent Results (from the past 240 hour(s))  Urine  Culture     Status: Abnormal   Collection Time: 12/14/19 12:44 PM   Specimen: Urine, Random  Result Value Ref Range Status   Specimen Description   Final    URINE, RANDOM Performed at [facility], [address], [place], [place] [address]    Special Requests   Final    NONE Performed at [facility], [place], [place] [address]    Culture (A)  Final    >=100,000 COLONIES/mL VANCOMYCIN RESISTANT ENTEROCOCCUS 20,000 COLONIES/mL YEAST    Report Status 12/18/2019 FINAL  Final   Organism ID, Bacteria VANCOMYCIN RESISTANT ENTEROCOCCUS (A)  Final      Susceptibility   Vancomycin resistant enterococcus - MIC*    AMPICILLIN >=32 RESISTANT Resistant     NITROFURANTOIN 256 RESISTANT Resistant     VANCOMYCIN >=32 RESISTANT Resistant     LINEZOLID 2 SENSITIVE Sensitive     * >=100,000 COLONIES/mL VANCOMYCIN RESISTANT ENTEROCOCCUS  SARS Coronavirus 2 by RT PCR (hospital order, performed in [place] hospital lab) Nasopharyngeal Nasopharyngeal Swab     Status: None   Collection Time: 12/14/19  5:37 PM   Specimen: Nasopharyngeal Swab  Result Value Ref Range Status   SARS Coronavirus 2 NEGATIVE NEGATIVE Final    Comment: (NOTE) SARS-CoV-2 target nucleic acids are  NOT DETECTED.  The SARS-CoV-2 RNA is generally detectable in upper and lower respiratory specimens during the acute phase of infection. The lowest concentration of SARS-CoV-2 viral copies this assay can detect is 250 copies / mL. A negative result does not preclude SARS-CoV-2 infection and should not be used as the sole basis for treatment or other patient management decisions.  A negative result may occur with improper specimen collection / handling, submission of specimen other than nasopharyngeal swab, presence of viral mutation(s) within the areas targeted by this assay, and inadequate number of viral copies (<250 copies / mL). A negative result must be combined with clinical observations,  patient history, and epidemiological information.  Fact Sheet for Patients:   https://www.fda.gov/media/136312/download  Fact Sheet for Healthcare Providers: https://www.fda.gov/media/136313/download  This test is not yet approved or  cleared by the [place] FDA and has been authorized for detection and/or diagnosis of SARS-CoV-2 by FDA under an Emergency Use Authorization (EUA).  This EUA will remain in effect (meaning this test can be used) for the duration of the COVID-19 declaration under Section 564(b)(1) of the Act, 21 U.S.C. section 360bbb-3(b)(1), unless the authorization is terminated or revoked sooner.  Performed at [facility], [address] [place], [place] [address]   Blood Culture (routine x 2)     Status: None   Collection Time: 12/14/19  7:51 PM   Specimen: BLOOD  Result Value Ref Range Status   Specimen Description BLOOD BLOOD LEFT ARM  Final   Special Requests   Final    BOTTLES DRAWN AEROBIC AND ANAEROBIC Blood Culture adequate volume   Culture   Final    NO GROWTH 5 DAYS Performed at [facility], [address] [place], [place] [address]    Report Status 12/19/2019 FINAL  Final  Blood Culture (routine x 2)     Status: None   Collection Time: 12/14/19  7:51 PM   Specimen: BLOOD  Result Value Ref Range Status   Specimen Description BLOOD BLOOD LEFT HAND  Final   Special Requests   Final    BOTTLES DRAWN AEROBIC AND ANAEROBIC Blood Culture adequate volume   Culture   Final    NO GROWTH 5 DAYS Performed at [facility], [address] [place], [place] [address]    Report Status 12/19/2019 FINAL  Final     Labs: Basic Metabolic Panel: Recent Labs  Lab 12/14/19 1244 12/15/19 1521 12/16/19 0653  NA 140 142 144  K 4.0 4.3 3.9  CL 109 114* 113*  CO2 17* 19* 20*  GLUCOSE 111* 140* 104*  BUN 46* 47* 42*  CREATININE 1.99* 1.88* 1.70*  CALCIUM 9.8 9.6 9.9  MG  --   --  2.1   Liver Function Tests: Recent  Labs  Lab 12/14/19 1244 12/15/19 1521  AST 20 20  ALT 13 13  ALKPHOS 89 77  BILITOT 0.8 0.6  PROT 6.7 6.2*  ALBUMIN 3.2* 3.0*   No results for input(s): LIPASE, AMYLASE in the last 168 hours. No results for input(s): AMMONIA in the last 168 hours. CBC: Recent Labs  Lab 12/14/19 1244 12/15/19 1521 12/16/19 0653  WBC 18.6* 14.9* 14.7*  NEUTROABS 14.8* 11.4*  --   HGB 11.0* 10.9* 10.9*  HCT 33.1* 33.1* 33.2*  MCV 90.4 91.2 91.5  PLT 395 340 369   Cardiac Enzymes: No results for input(s): CKTOTAL, CKMB, CKMBINDEX, TROPONINI in the last 168 hours. BNP: BNP (last 3 results) No results for input(s): BNP   in the last 8760 hours.  ProBNP (last 3 results) No results for input(s): PROBNP in the last 8760 hours.  CBG: No results for input(s): GLUCAP in the last 168 hours.  Active Problems:   Fall   Falls frequently   Time coordinating discharge: 38 minutes.  Signed:         , DO [facility]  12/19/2019, 4:18 PM  

## 2019-12-19 NOTE — Progress Notes (Signed)
PROGRESS NOTE  Carolyn Hernandez WEX:[id number] DOB: 1930-03-01 DOA: 12/14/2019 PCP: Verl Bangs, FNP  Brief History   Carolyn Hernandez is a 86 y.o. female with medical history significant of advanced dementia, frequent falls, HTN who presented from SNF after an unwitnessed fall.  Pt has advanced dementia, so HPI obtained from her daughter at bedside.  Pt has had frequent falls, and about 6 weeks ago, pt stopped being able to walk and had moved from assisted living to SNF.  Pt had most likely hit her head during those falls because pt had bruising to her face.  Pt has had multiple ED visits after these falls (3 in the past month), and was dx with UTI during one of those visits.  Pt has had no urinary symptoms, dysuria, abdominal pain or fevers, per daughter.  Daughter didn't know whether pt was having normal PO intake or hydration at the SNF because pt couldn't remember whether she had had a meal or not.  Daughter said pt "sun downs" and becomes more confused and agitated at night.  Pt herself did complain of pain of various places at various times.  According to medical records, pt had a course of Keflex prescribed on 5/28 and Levaquin prescribed on 6/25.  Last urine cx on 6/16 (obtained during ED visit) grew pseudomonas, but abx was not started at that time due to no signs of UTI.  In the ED the patient was afebrile, pulse 98, BP 159/81, sating 100% on room air.  Labs notable for WBC 18.6, Cr 1.99, lactic acid 1.1, UA showed neg nitrite/large leuk/many bacteria, CXR no acute finding, pan skeletal xrays showed no acute fractures.  Pt was admitted to a telemetry bed.   Palliative care was consulted. They met with the family on the afternoon of 12/16/2019. They decided to make the patient comfort care only and to seek a residential hospice bed for discharge. Comfort care measures were initiated.  Consultants  . Palliative care  Procedures  . None  Antibiotics   Anti-infectives  (From admission, onward)   Start     Dose/Rate Route Frequency Ordered Stop   12/14/19 1600  ceFEPIme (MAXIPIME) 2 g in sodium chloride 0.9 % 100 mL IVPB  Status:  Discontinued        2 g 200 mL/hr over 30 Minutes Intravenous  Once 12/14/19 1549 12/14/19 1743      Subjective  The patient is resting comfortably. She is complaining of pain in her moth when she eats and burning pain when she defecates.   Objective   Vitals:  Vitals:   12/18/19 0809 12/19/19 0008  BP: (!) 151/73 127/72  Pulse: 91 90  Resp: 18 14  Temp: (!) 97.5 F (36.4 C) 98.3 F (36.8 C)  SpO2: 98% 97%   Exam:  Constitutional:  . The patient is awake and alert. No acute distress. Respiratory:  . No increased work of breathing. . No wheezes, rales, or rhonchi . No tactile fremitus Cardiovascular:  . Regular rate and rhythm . No murmurs, ectopy, or gallups. . No lateral PMI. No thrills. Abdomen:  . Abdomen is soft, non-tender, non-distended . No hernias, masses, or organomegaly . Normoactive bowel sounds.  Musculoskeletal:  . No cyanosis, clubbing, or edema Skin:  . No rashes, lesions, ulcers . palpation of skin: no induration or nodules Neurologic:  Unable to evaluate due to the patient's inability to cooperate with exam. Psychiatric:  Unable to evaluate due to the patient's inability to cooperate  with exam.  I have personally reviewed the following:   Today's Data  . Orthoptist  . Blood culture x 2 . Urine culture (12/01/2019) . Urine culture (12/14/2019)  Imaging  . CT head . CT C-spine  Scheduled Meds: . docusate sodium  100 mg Oral Daily  . feeding supplement (ENSURE ENLIVE)  237 mL Oral BID BM  . hydrocortisone  25 mg Rectal BID  . nystatin  5 mL Oral QID   Continuous Infusions:  Active Problems:   Fall   Falls frequently   LOS: 4 days   A & P  Debility with recurrent falls: Pt is now non-ambulatory at baseline. Due to her dementia, she continues to try to get out  of bed although this is unsafe. She is now comfort care only. Residential hospice bed is being sought.  Hemorrhoids: Anusol  Thrush: Magic mouthwash and Nystatin  Leukocytosis: Likely stress reaction. No obvious source of infection. Monitor for fever. Procalcitonin negative. WBC down somewhat due to hydration. Comfort care only.  Urinary incontinence: Related to dementia. Noted. Urine culture positive on 12/01/2019, but this likely reflects colonization. Comfort care only.  Advanced dementia: Pt sundowns. Redirect as possible.  Haldol is available for agitation. Palliative care is consulted. Comfort care only..  CKD IV: Creatinine is 1.99 on admission. The patient is receiving IV fluids. Comfort care only.  Hypertension: Comfort care only.  I have seen and examined this patient myself. I have spent 34 minutes in his evaluation and care.  DVT Prophylaxis: Heparin CODE STATUS: DNR Family Communication: None available Disposition: The patient is from SNF. Anticipate discharge to residential  Hospice when a bed is available. Barriers to discharge: Residential hospice bed.  Status is: Inpatient Remains inpatient appropriate because:Inpatient level of care appropriate due to severity of illness   Dispo: The patient is from: SNF              Anticipated d/c is to: Residential Hospice              Anticipated d/c date is: 2 days              Patient currently is not medically stable to d/c. No safe discharge.  Axl Rodino, DO [facility] Direct contact: see [email or web address]  7PM-7AM contact night coverage as above 12/19/2019, 10:57 PM  LOS: 0 days

## 2019-12-19 NOTE — TOC Transition Note (Signed)
Transition of Care [facility]) - CM/SW Discharge Note   Patient Details  Name: Carolyn Hernandez MRN: [id number] Date of Birth: 05-Sep-1929  Transition of Care [facility]) CM/SW Contact:  Boris Sharper, LCSW Phone Number: 12/19/2019, 4:09 PM   Clinical Narrative:    Pt medically stable for discharge per MD. Deddie with [facility] notified CSW of bed availability. CSW notified MD.  Pt will be transported to [facility] via EMS. CSW will arrange EMS transport once nursing staff ready. Pt's son aware of discharge and meeting with Debbie to sign paperwork. Call to report number is [phone].    Final next level of care: [place] Barriers to Discharge: No Barriers Identified   Patient Goals and CMS Choice Patient states their goals for this hospitalization and ongoing recovery are::  (son and daughter- [name] and [name]) CMS Medicare.gov Compare Post Acute Care list provided to:: Patient Represenative (must comment) Choice offered to / list presented to : Adult Children, HC POA / Guardian (son and daughter)  Discharge Placement              Patient chooses bed at: Other - please specify in the comment section below: ([facility]) Patient to be transferred to facility by: EMS Name of family member notified: [name] Patient and family notified of of transfer: 12/19/19  Discharge Plan and Services   Discharge Planning Services: CM Consult Post Acute Care Choice: Resumption of Svcs/PTA Provider, [place]                               Social Determinants of Health (SDOH) Interventions     Readmission Risk Interventions No flowsheet data found.

## 2019-12-19 NOTE — TOC Progression Note (Signed)
Transition of Care [facility]) - Progression Note    Patient Details  Name: Carolyn Hernandez MRN: [id number] Date of Birth: 1929-07-17  Transition of Care [facility]) CM/SW [place], LCSW Phone Number: 12/19/2019, 9:09 AM  Clinical Narrative:    No Hospice Beds available at this time   Expected Discharge Plan: [place] Barriers to Discharge: Hospice Bed not available  Expected Discharge Plan and Services Expected Discharge Plan: [place]   Discharge Planning Services: CM Consult Post Acute Care Choice: Resumption of Svcs/PTA Provider, [place] Living arrangements for the past 2 months: [place] (at [facility] before [facility] this month)                                       Social Determinants of Health (SDOH) Interventions    Readmission Risk Interventions No flowsheet data found.

## 2019-12-19 NOTE — Progress Notes (Signed)
   12/19/19 0900  Urine Characteristics  Bladder Scan Volume (mL) 1200 mL (greater than 1200)  notified MD Swayze

## 2019-12-23 ENCOUNTER — Encounter: Payer: Self-pay | Admitting: Family Medicine

## 2019-12-23 DIAGNOSIS — Z515 Encounter for palliative care: Secondary | ICD-10-CM | POA: Insufficient documentation

## 2020-01-16 DEATH — deceased

## 2021-09-19 IMAGING — CT CT HEAD W/O CM
3 series · 14 of 47 positions shown, 16 images · non-contrast
Comparison: CT head 04/22/2002 (report only)

CLINICAL DATA: Fall consult for could consult

EXAM:
CT HEAD WITHOUT CONTRAST
CT CERVICAL SPINE WITHOUT CONTRAST
TECHNIQUE: Multidetector CT imaging of the head and cervical spine was
performed following the standard protocol without intravenous
contrast. Multiplanar CT image reconstructions of the cervical spine
were also generated.

[Series 2: head wo · axial · 0.39mm/px · z∈[+376,+516]mm · 8 of 34 slices shown, 10 images]
[im 3/34  brain]
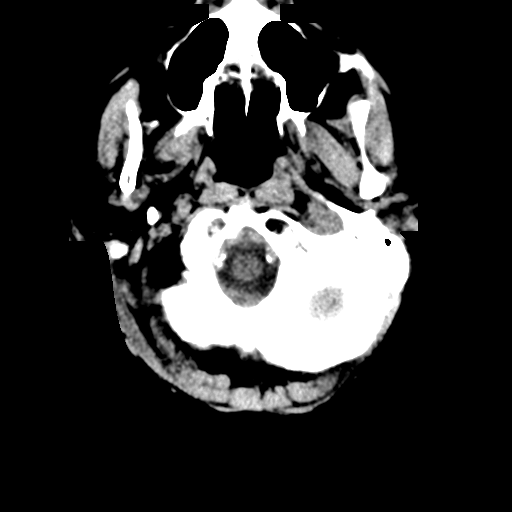
[im 3/34  bone]
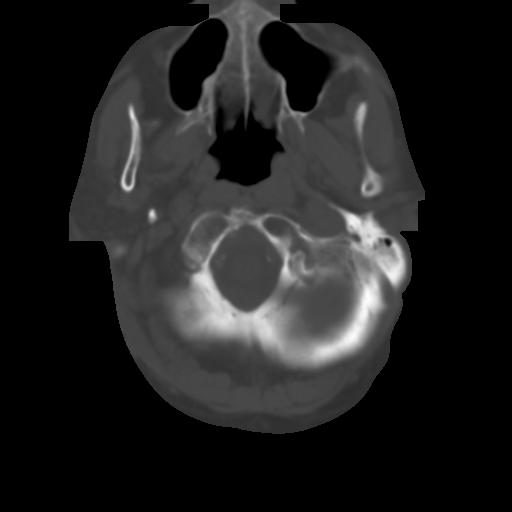
[im 7/34  brain]
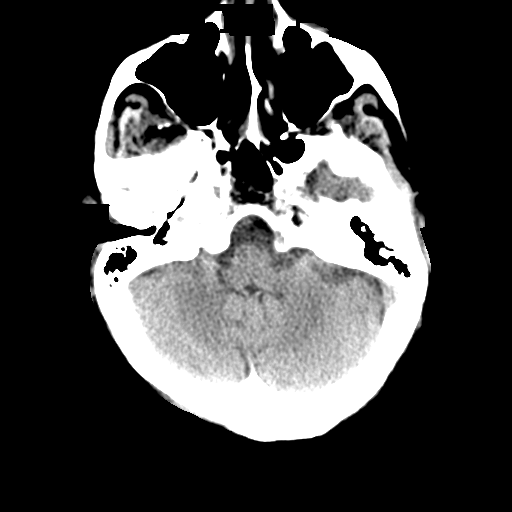
[im 11/34  brain]
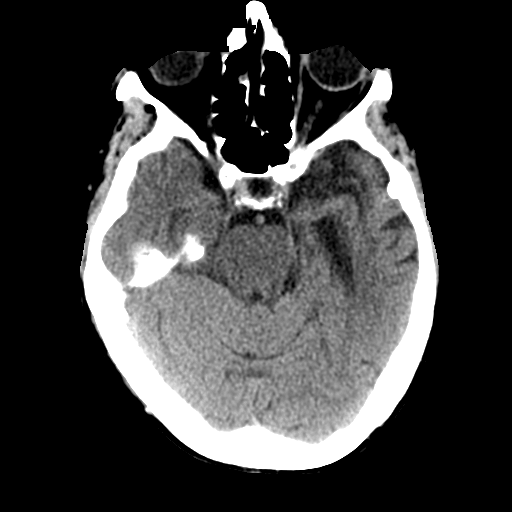
[im 15/34  brain]
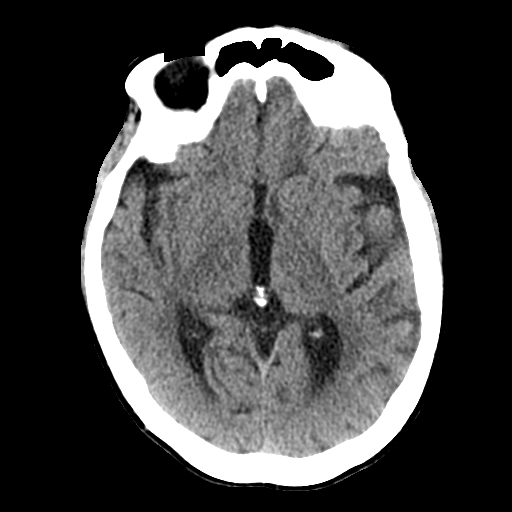
[im 19/34  brain]
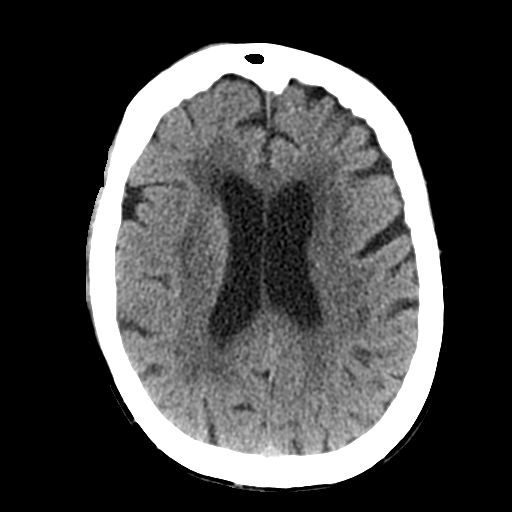
[im 19/34  bone]
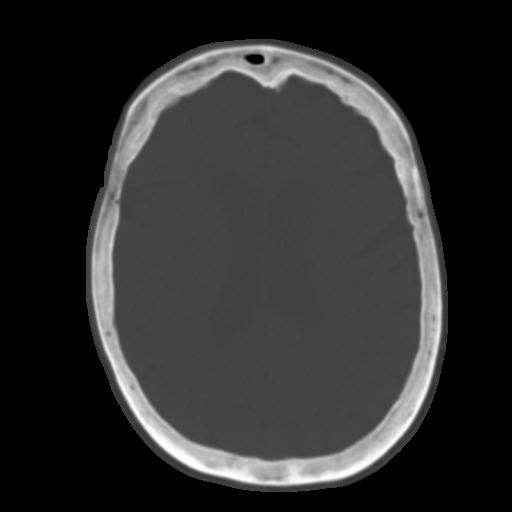
[im 23/34  brain]
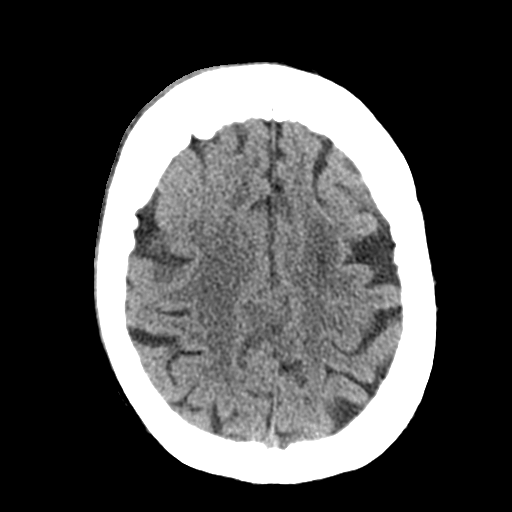
[im 27/34  brain]
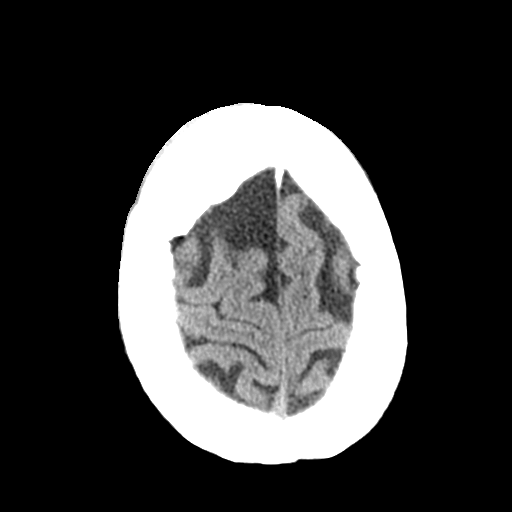
[im 31/34  brain]
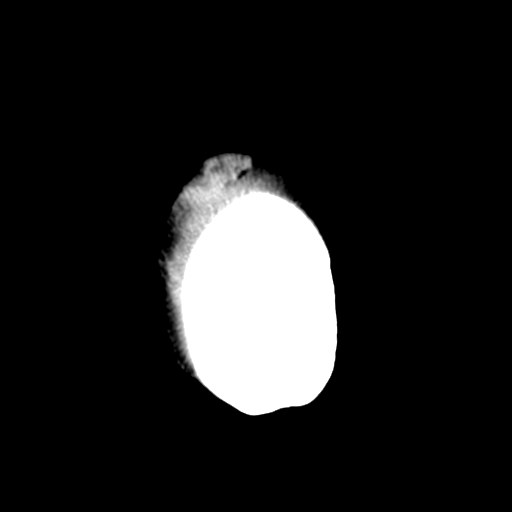

[Series 4: coronal soft tissue · coronal · 0.31mm/px · 3 of 66 slices shown]
[im 22/66  brain]
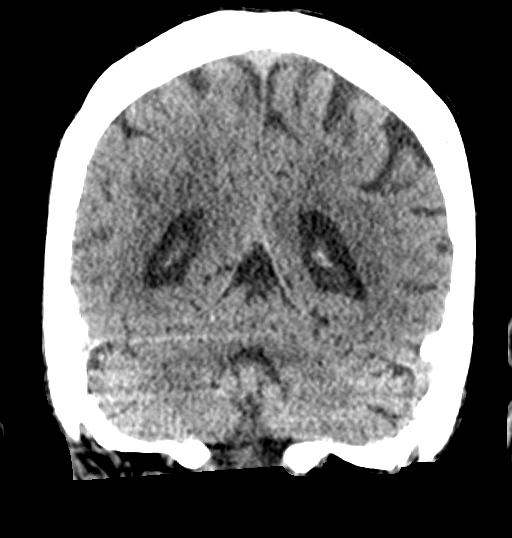
[im 29/66  brain]
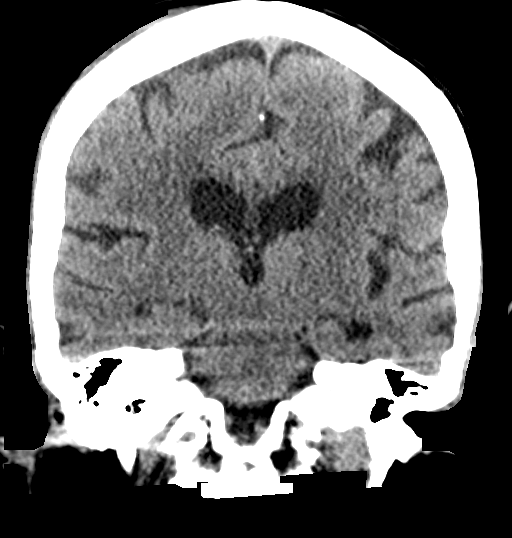
[im 37/66  brain]
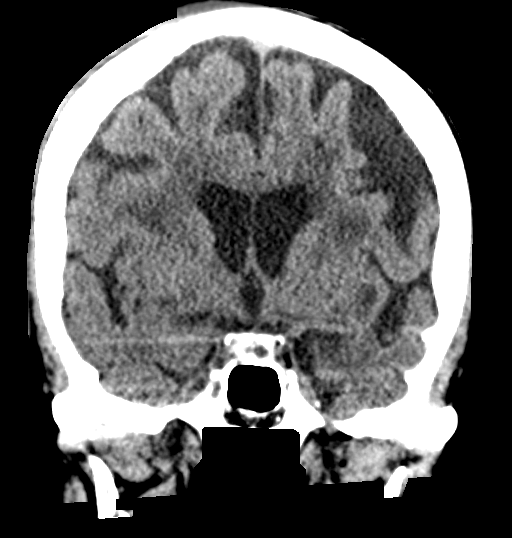

[Series 5: sagittal soft tissue · sagittal · 0.32mm/px · 3 of 52 slices shown]
[im 18/52  brain]
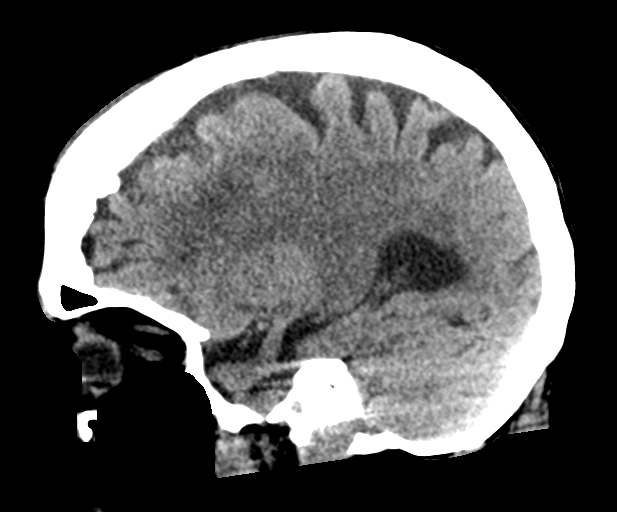
[im 26/52  brain]
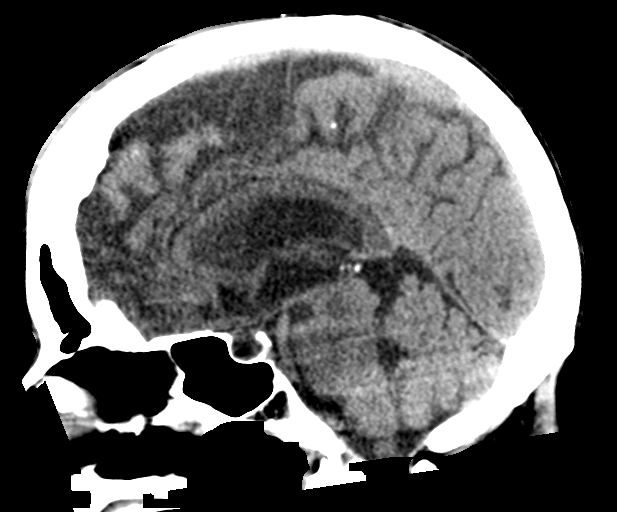
[im 35/52  brain]
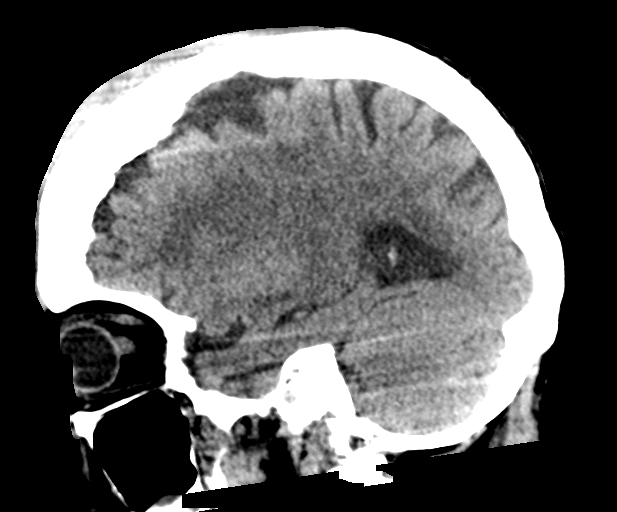

[14 of 47 positions shown; findings below may reference images not displayed]

FINDINGS: CT HEAD FINDINGS

Brain: No evidence of acute infarction, hemorrhage, hydrocephalus,
extra-axial collection or mass lesion/mass effect. Symmetric
prominence of the ventricles, cisterns and sulci compatible with
parenchymal volume loss. Patchy areas of white matter
hypoattenuation are most compatible with chronic microvascular
angiopathy.

Vascular: Atherosclerotic calcification of the carotid siphons and
intradural vertebral arteries. No hyperdense vessel.

Skull: There is a large right frontoparietal scalp hematoma
extending towards the vertex measuring up to 11 mm in maximal
thickness. Additional swelling extends across the frontal scalp to
the supraorbital soft tissues on the right. No subjacent calvarial
fracture is seen. Hyperostosis frontalis interna is noted, a benign
incidental finding.

Sinuses/Orbits: Paranasal sinuses and mastoid air cells are
predominantly clear. Prior left lens extraction.

Other: Patient is edentulous.

CT CERVICAL SPINE FINDINGS

Alignment: Stabilization collar is absent at the time of
examination. There is mild straightening of the midcervical
lordosis. There is slight anterolisthesis C3 on 4 and retrolisthesis
C4 on 5 likely on a degenerative basis. No evidence of traumatic
listhesis. No abnormally widened, perched or jumped facets. Normal
alignment of the craniocervical and atlantoaxial articulations
accounting for degree of leftward head rotation.

Skull base and vertebrae: No acute fracture. No primary bone lesion
or focal pathologic process. There is bony fusion of the vertebrae
from C5-C7. Extensive posterior enthesopathic calcification noted
superficial to the spinous processes along the nuchal ligament.
There is extensive calcific pannus formation about the dens on a
background of more moderate to severe atlantodental arthrosis
including degenerative change with articulation of the basion.

Soft tissues and spinal canal: No pre or paravertebral fluid or
swelling. No visible canal hematoma.

Disc levels: Multilevel intervertebral disc height loss with
spondylitic endplate changes. Posterior disc osteophyte complexes
are most pronounced at the C4-5 and C5-6 levels resulting in at
least moderate canal stenosis. Some posterior ligamentum flavum
hypertrophic changes result in mild stenosis at C2-3, C3-4 and C7-T1
as well. Diffuse uncinate spurring and facet hypertrophic change
results in neural foraminal narrowing as well with more moderate to
severe narrowing bilaterally at C3-4 and C4-5.

Upper chest: No acute abnormality in the upper chest or imaged lung
apices.

Other: Cervical carotid atherosclerosis. Heterogeneous, multinodular
thyroid including several partially calcified nodules.
IMPRESSION: 1. Large right frontoparietal scalp hematoma extending towards the
vertex measuring up to 11 mm in maximal thickness. Additional
swelling extends across the frontal scalp to the supraorbital soft
tissues on the right. No subjacent calvarial fracture is seen. No
acute intracranial abnormality.
2. Chronic microvascular angiopathy and parenchymal volume loss.
3. No evidence of acute fracture or traumatic listhesis of the
cervical spine.
4. Bony fusion of the vertebrae from C5-C7.
5. Multilevel intervertebral disc height loss with spondylitic
endplate changes and facet hypertrophic change detailed above.
Maximal change includes moderate canal stenoses at C4-5, C5-6 and
moderate to severe foraminal narrowing C3-4 and C4-5 bilaterally.
6. Heterogeneous, multinodular thyroid including several partially
calcified nodules. Consider further evaluation with thyroid
ultrasound following assessment of patient's underlying
comorbidities and desire to pursue further workup. This follows
consensus guidelines: Managing Incidental Thyroid Nodules Detected
on Imaging: White Paper of [REDACTED]. [HOSPITAL] 1066; [DATE]. and [employer] 3-tiered
system for managing ITNs: [HOSPITAL]. [DATE]): 143-50
7. Cervical intracranial atherosclerosis.

## 2021-09-19 IMAGING — CT CT CERVICAL SPINE W/O CM
3 of 4 series · 10 of 33 positions shown, 12 images · non-contrast
Comparison: CT head 04/22/2002 (report only)

CLINICAL DATA: Fall consult for could consult

EXAM:
CT HEAD WITHOUT CONTRAST
CT CERVICAL SPINE WITHOUT CONTRAST
TECHNIQUE: Multidetector CT imaging of the head and cervical spine was
performed following the standard protocol without intravenous
contrast. Multiplanar CT image reconstructions of the cervical spine
were also generated.

[Series 4: sagittal bone · sagittal · 0.48mm/px · 5 of 39 slices shown, 6 images]
[im 13/39  bone]
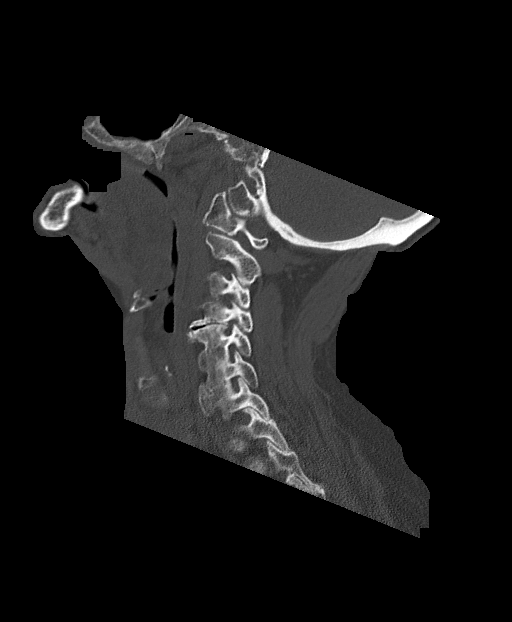
[im 16/39  bone]
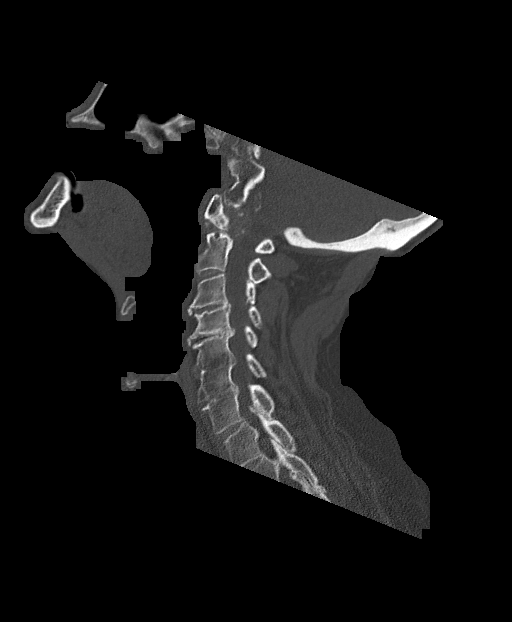
[im 20/39  soft-tissue]
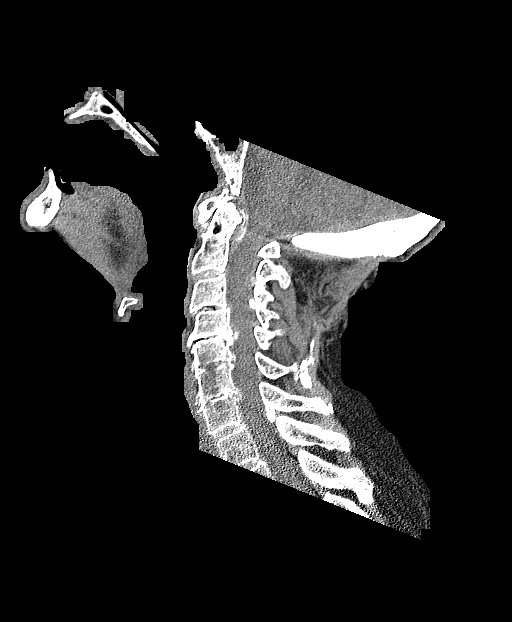
[im 20/39  bone]
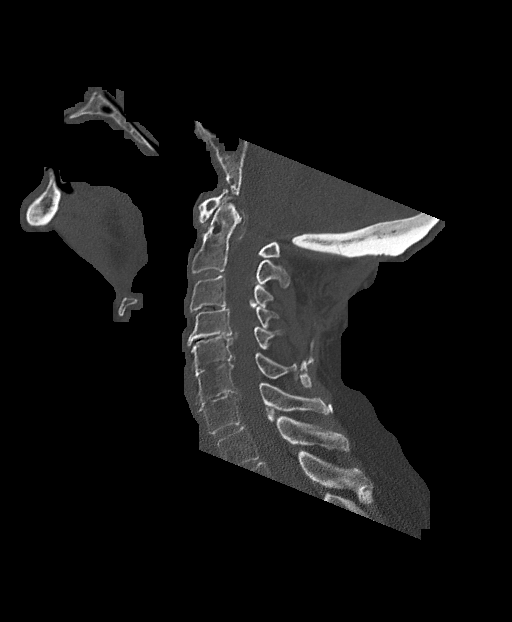
[im 23/39  bone]
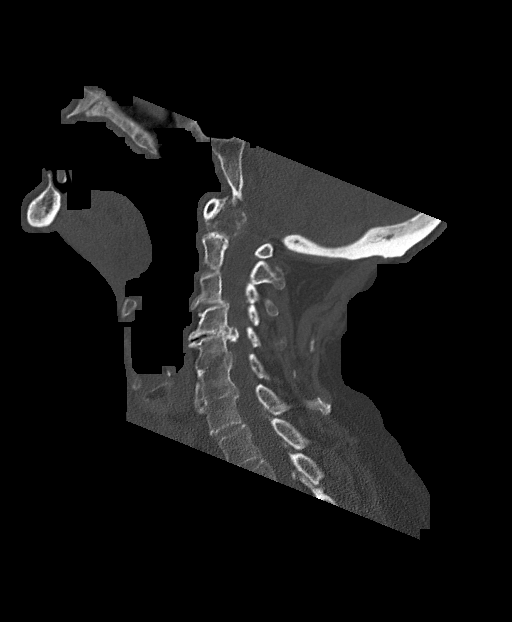
[im 26/39  bone]
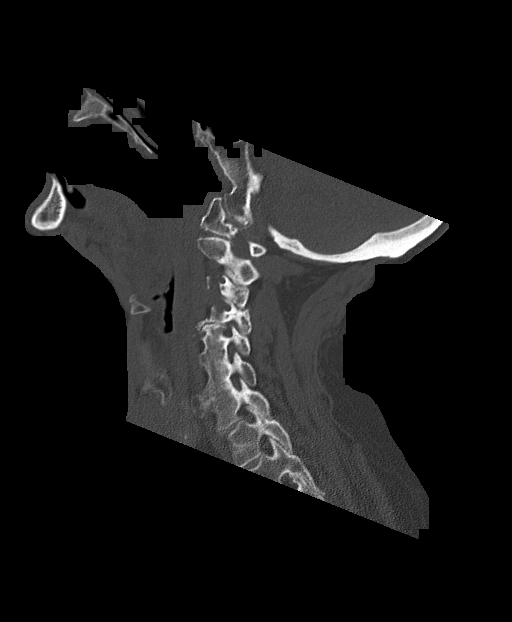

[Series 5: coronal bone · coronal · 0.22mm/px · 3 of 50 slices shown]
[im 10/50  bone]
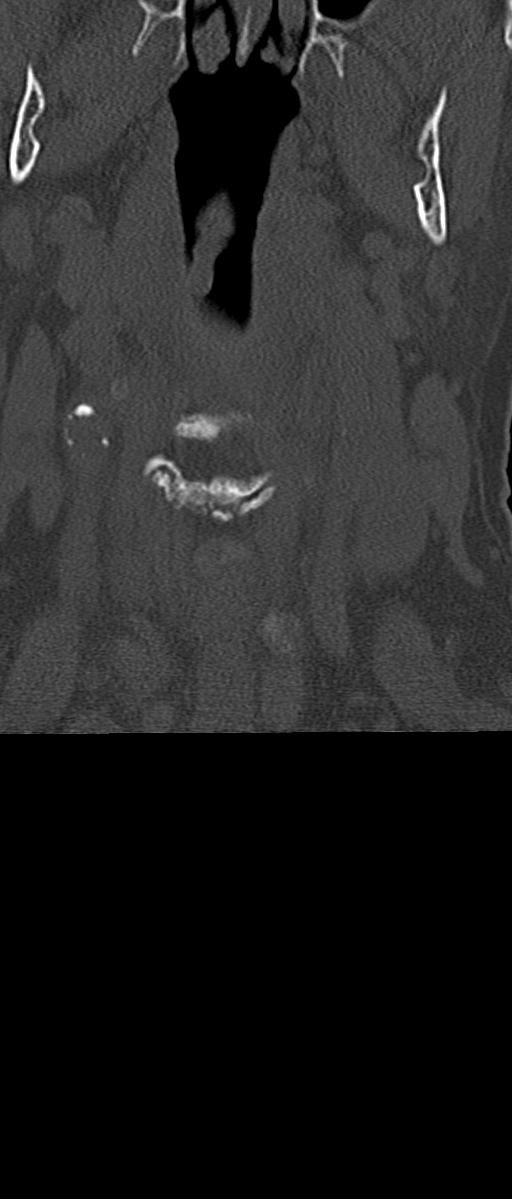
[im 20/50  bone]
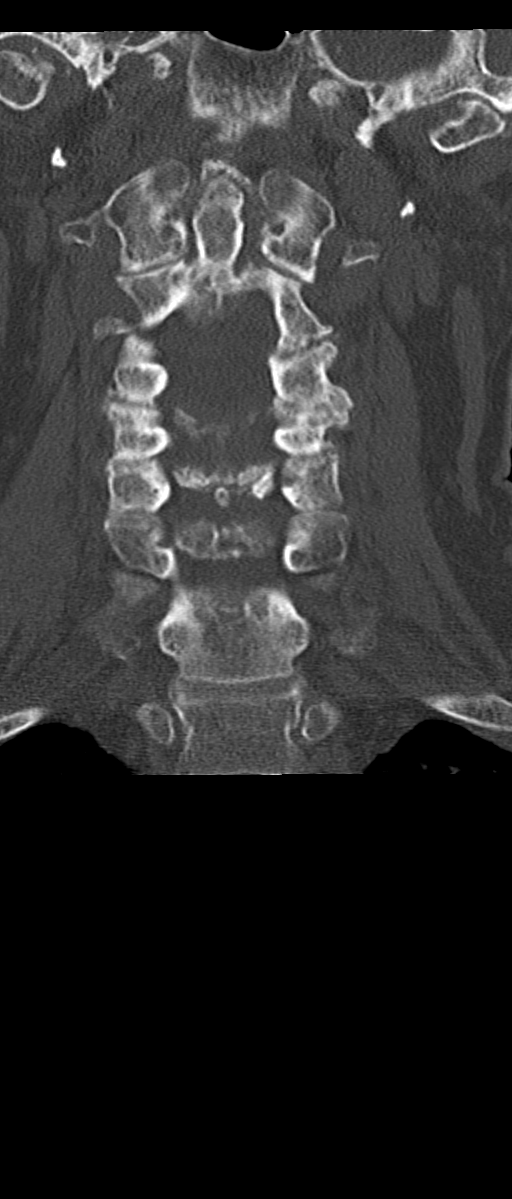
[im 30/50  bone]
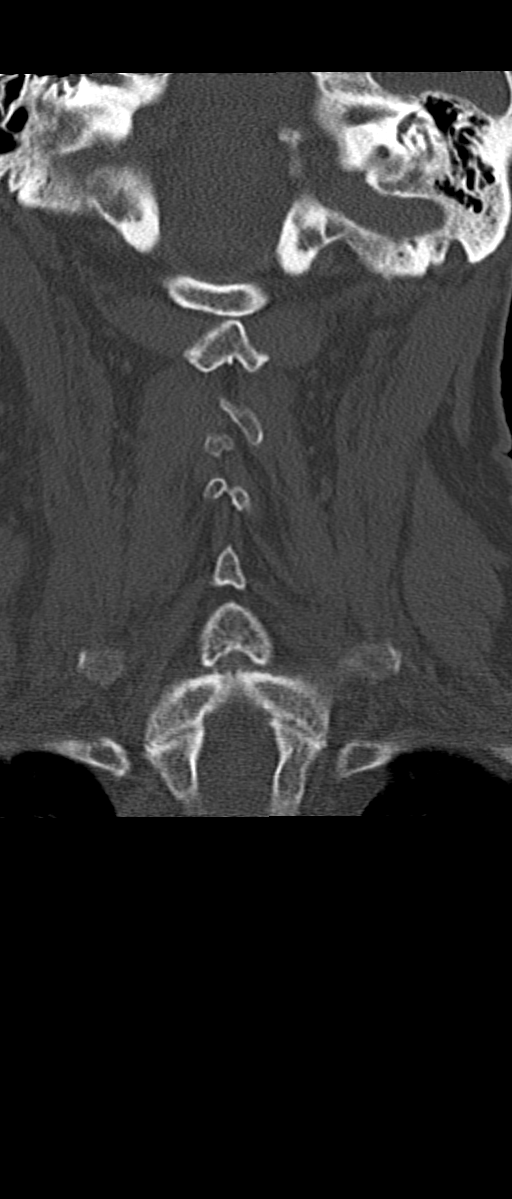

[Series 6: orthogonal bone · axial · 0.19mm/px · z∈[+257,+326]mm · 2 of 86 slices shown, 3 images]
[im 25/86  soft-tissue]
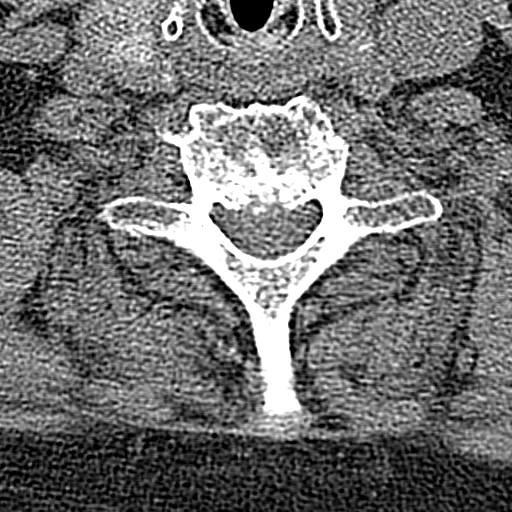
[im 25/86  bone]
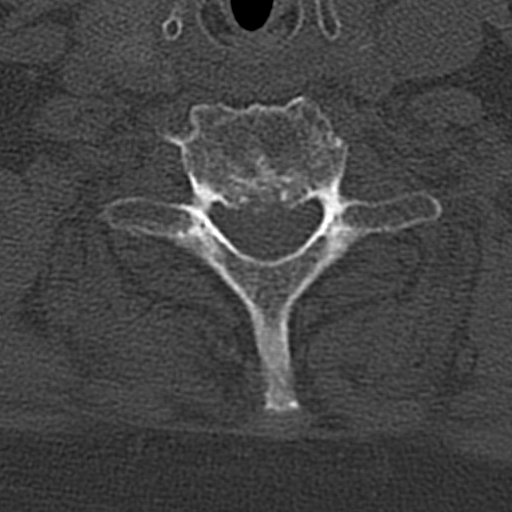
[im 61/86  bone]
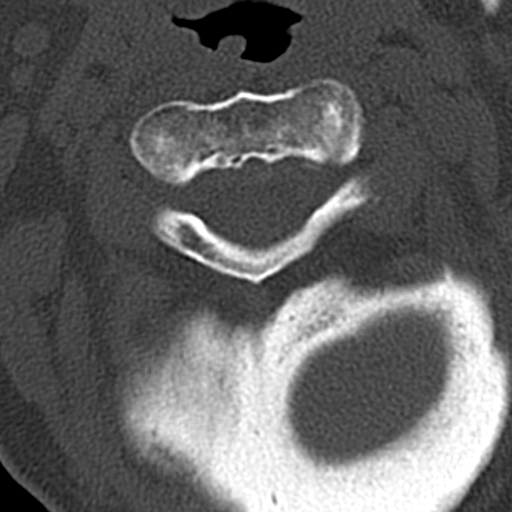

[10 of 33 positions shown; findings below may reference images not displayed]

FINDINGS: CT HEAD FINDINGS

Brain: No evidence of acute infarction, hemorrhage, hydrocephalus,
extra-axial collection or mass lesion/mass effect. Symmetric
prominence of the ventricles, cisterns and sulci compatible with
parenchymal volume loss. Patchy areas of white matter
hypoattenuation are most compatible with chronic microvascular
angiopathy.

Vascular: Atherosclerotic calcification of the carotid siphons and
intradural vertebral arteries. No hyperdense vessel.

Skull: There is a large right frontoparietal scalp hematoma
extending towards the vertex measuring up to 11 mm in maximal
thickness. Additional swelling extends across the frontal scalp to
the supraorbital soft tissues on the right. No subjacent calvarial
fracture is seen. Hyperostosis frontalis interna is noted, a benign
incidental finding.

Sinuses/Orbits: Paranasal sinuses and mastoid air cells are
predominantly clear. Prior left lens extraction.

Other: Patient is edentulous.

CT CERVICAL SPINE FINDINGS

Alignment: Stabilization collar is absent at the time of
examination. There is mild straightening of the midcervical
lordosis. There is slight anterolisthesis C3 on 4 and retrolisthesis
C4 on 5 likely on a degenerative basis. No evidence of traumatic
listhesis. No abnormally widened, perched or jumped facets. Normal
alignment of the craniocervical and atlantoaxial articulations
accounting for degree of leftward head rotation.

Skull base and vertebrae: No acute fracture. No primary bone lesion
or focal pathologic process. There is bony fusion of the vertebrae
from C5-C7. Extensive posterior enthesopathic calcification noted
superficial to the spinous processes along the nuchal ligament.
There is extensive calcific pannus formation about the dens on a
background of more moderate to severe atlantodental arthrosis
including degenerative change with articulation of the basion.

Soft tissues and spinal canal: No pre or paravertebral fluid or
swelling. No visible canal hematoma.

Disc levels: Multilevel intervertebral disc height loss with
spondylitic endplate changes. Posterior disc osteophyte complexes
are most pronounced at the C4-5 and C5-6 levels resulting in at
least moderate canal stenosis. Some posterior ligamentum flavum
hypertrophic changes result in mild stenosis at C2-3, C3-4 and C7-T1
as well. Diffuse uncinate spurring and facet hypertrophic change
results in neural foraminal narrowing as well with more moderate to
severe narrowing bilaterally at C3-4 and C4-5.

Upper chest: No acute abnormality in the upper chest or imaged lung
apices.

Other: Cervical carotid atherosclerosis. Heterogeneous, multinodular
thyroid including several partially calcified nodules.
IMPRESSION: 1. Large right frontoparietal scalp hematoma extending towards the
vertex measuring up to 11 mm in maximal thickness. Additional
swelling extends across the frontal scalp to the supraorbital soft
tissues on the right. No subjacent calvarial fracture is seen. No
acute intracranial abnormality.
2. Chronic microvascular angiopathy and parenchymal volume loss.
3. No evidence of acute fracture or traumatic listhesis of the
cervical spine.
4. Bony fusion of the vertebrae from C5-C7.
5. Multilevel intervertebral disc height loss with spondylitic
endplate changes and facet hypertrophic change detailed above.
Maximal change includes moderate canal stenoses at C4-5, C5-6 and
moderate to severe foraminal narrowing C3-4 and C4-5 bilaterally.
6. Heterogeneous, multinodular thyroid including several partially
calcified nodules. Consider further evaluation with thyroid
ultrasound following assessment of patient's underlying
comorbidities and desire to pursue further workup. This follows
consensus guidelines: Managing Incidental Thyroid Nodules Detected
on Imaging: White Paper of [REDACTED]. [HOSPITAL] 1066; [DATE]. and [employer] 3-tiered
system for managing ITNs: [HOSPITAL]. [DATE]): 143-50
7. Cervical intracranial atherosclerosis.

## 2021-09-19 IMAGING — CR DG ANKLE COMPLETE 3+V*L*
1 series · 3 of 3 positions shown · non-contrast
Comparison: None.

CLINICAL DATA: Fall.  Pain.

EXAM:
LEFT ANKLE COMPLETE - 3+ VIEW

[Series 1: dg ankle complete left · 0.14mm/px · 3 of 3 slices shown]
[im 1/3]
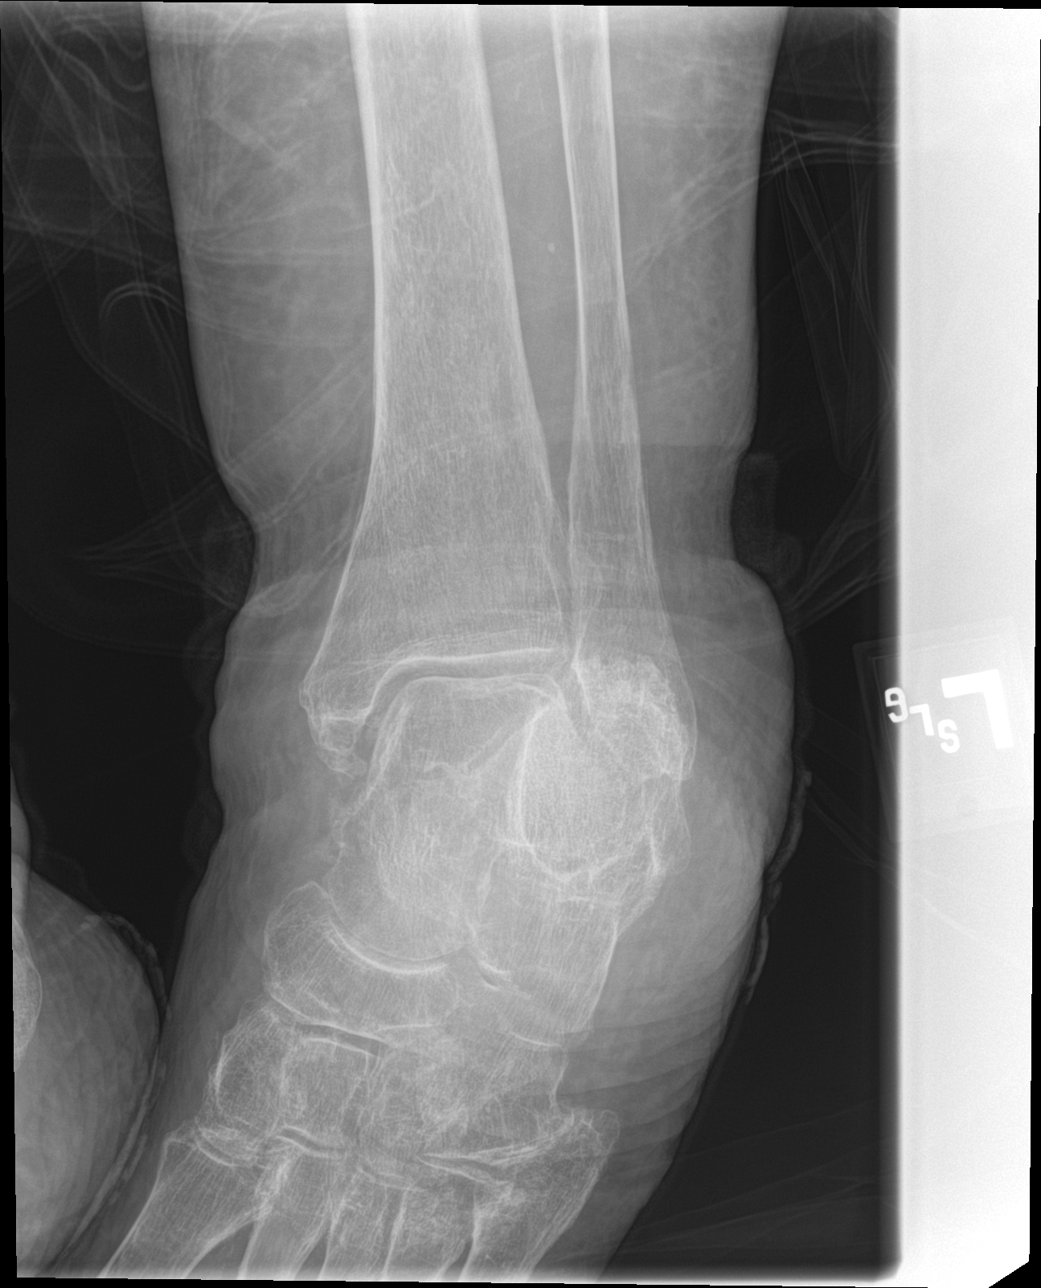
[im 2/3]
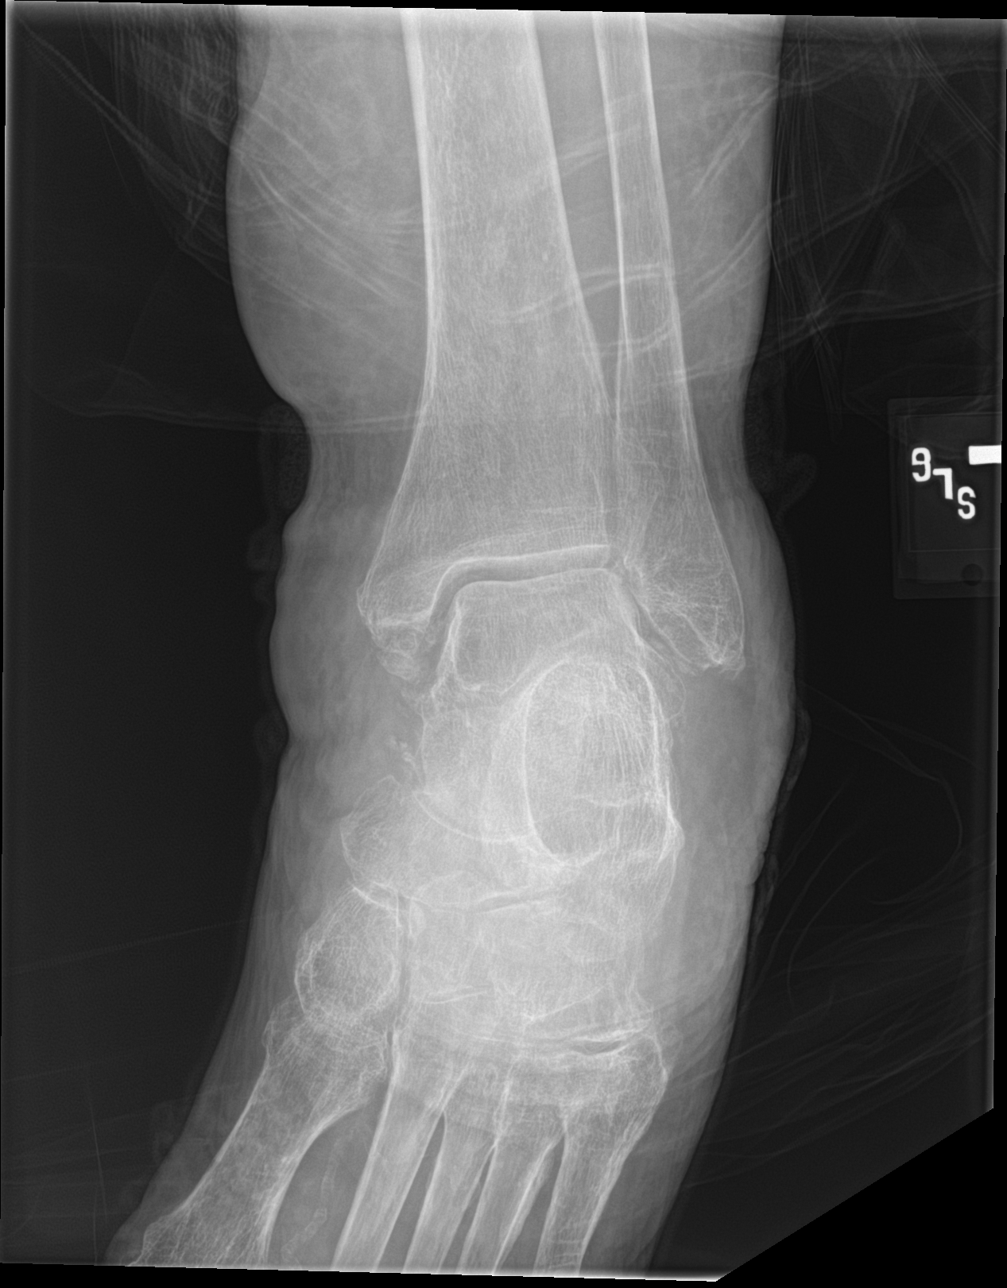
[im 3/3]
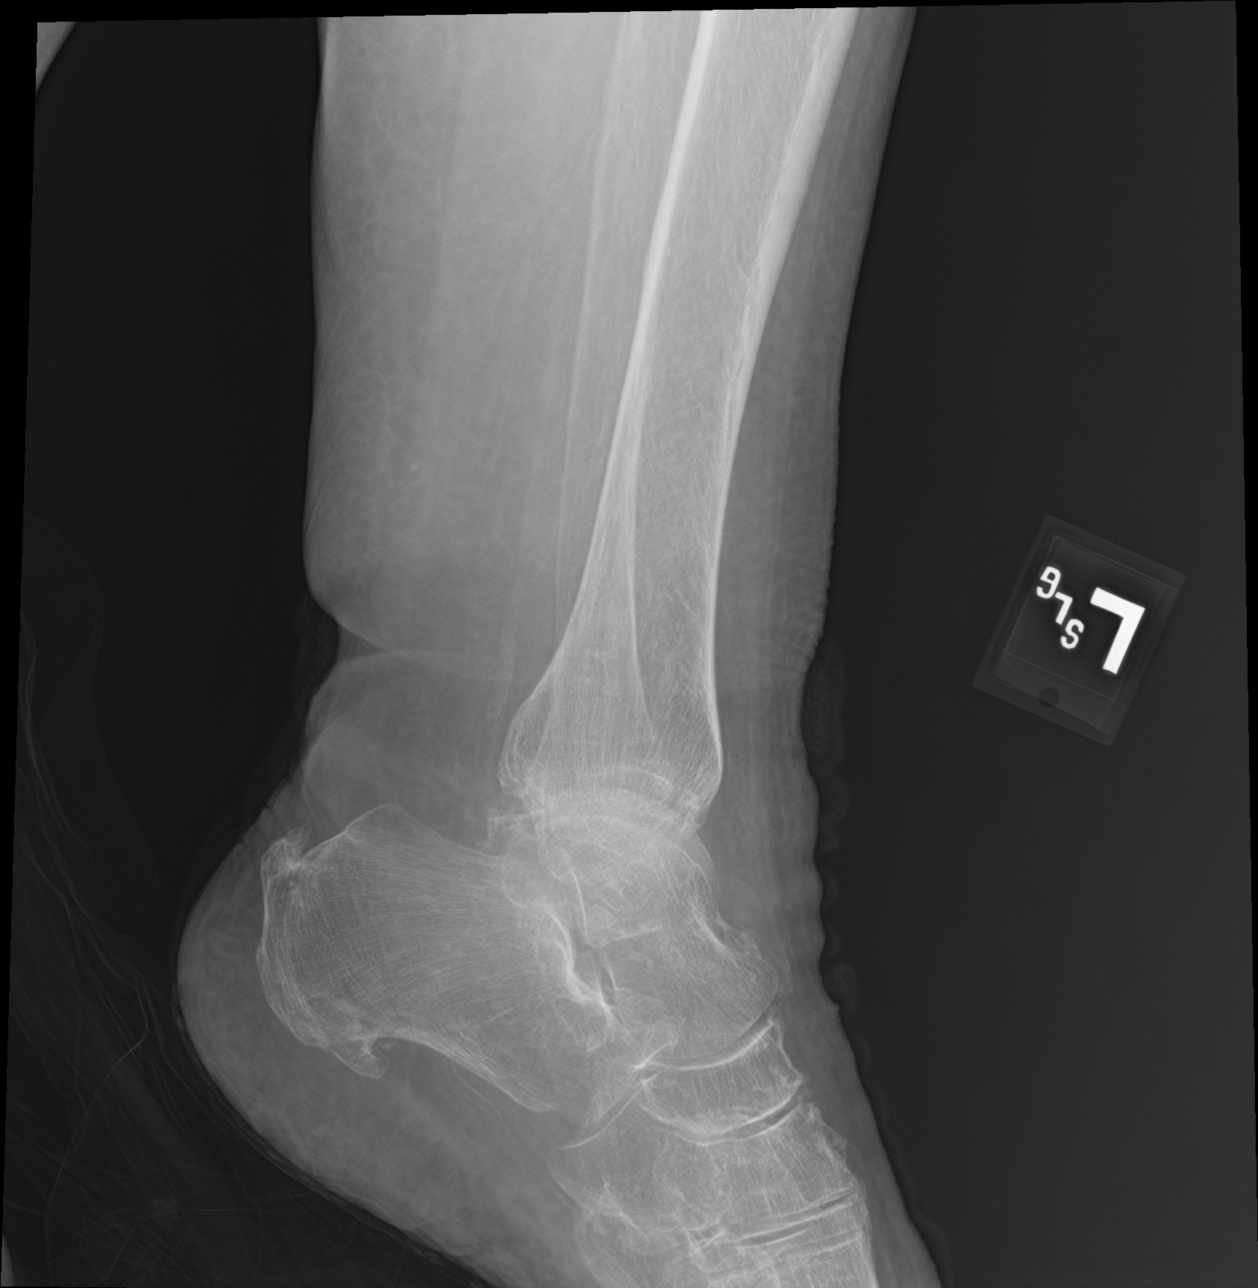

[3 of 3 positions shown; findings below may reference images not displayed]

FINDINGS: Soft tissue swelling is present about the ankle. Mild osteopenia is
present. The ankle is located. No acute fractures are present.
Degenerative changes are noted in the hindfoot. Calcaneal spurs are
present. Vascular calcifications are noted.
IMPRESSION: 1. Soft tissue swelling about the ankle without underlying fracture.
2. Calcaneal spurs.
3. Atherosclerosis.
